# Patient Record
Sex: Male | Born: 1955 | Race: White | Hispanic: No | Marital: Single | Smoking: Former smoker
Health system: Southern US, Community
[De-identification: ages and names within clinical notes are randomized; demographics above are authoritative.]

## PROBLEM LIST (undated history)

## (undated) DIAGNOSIS — Z87442 Personal history of urinary calculi: Secondary | ICD-10-CM

## (undated) DIAGNOSIS — J449 Chronic obstructive pulmonary disease, unspecified: Secondary | ICD-10-CM

## (undated) DIAGNOSIS — Z720 Tobacco use: Secondary | ICD-10-CM

## (undated) DIAGNOSIS — E669 Obesity, unspecified: Secondary | ICD-10-CM

## (undated) DIAGNOSIS — T8859XA Other complications of anesthesia, initial encounter: Secondary | ICD-10-CM

## (undated) DIAGNOSIS — I10 Essential (primary) hypertension: Secondary | ICD-10-CM

## (undated) DIAGNOSIS — G4730 Sleep apnea, unspecified: Secondary | ICD-10-CM

## (undated) DIAGNOSIS — M1990 Unspecified osteoarthritis, unspecified site: Secondary | ICD-10-CM

## (undated) DIAGNOSIS — E119 Type 2 diabetes mellitus without complications: Secondary | ICD-10-CM

## (undated) HISTORY — PX: CHOLECYSTECTOMY: SHX55

## (undated) HISTORY — PX: APPENDECTOMY: SHX54

## (undated) HISTORY — DX: Obesity, unspecified: E66.9

## (undated) HISTORY — DX: Chronic obstructive pulmonary disease, unspecified: J44.9

## (undated) HISTORY — PX: CARDIAC CATHETERIZATION: SHX172

## (undated) HISTORY — DX: Tobacco use: Z72.0

## (undated) HISTORY — PX: KNEE SURGERY: SHX244

## (undated) HISTORY — DX: Unspecified osteoarthritis, unspecified site: M19.90

## (undated) HISTORY — PX: CARPAL TUNNEL RELEASE: SHX101

## (undated) HISTORY — DX: Sleep apnea, unspecified: G47.30

## (undated) HISTORY — DX: Essential (primary) hypertension: I10

---

## 1998-01-14 ENCOUNTER — Ambulatory Visit (HOSPITAL_BASED_OUTPATIENT_CLINIC_OR_DEPARTMENT_OTHER): Admission: RE | Admit: 1998-01-14 | Discharge: 1998-01-14 | Payer: Self-pay | Admitting: Orthopedic Surgery

## 2006-10-19 ENCOUNTER — Ambulatory Visit: Payer: Self-pay | Admitting: Orthopaedic Surgery

## 2006-11-08 ENCOUNTER — Ambulatory Visit: Payer: Self-pay | Admitting: Orthopaedic Surgery

## 2006-11-08 ENCOUNTER — Other Ambulatory Visit: Payer: Self-pay

## 2006-11-27 ENCOUNTER — Emergency Department: Payer: Self-pay | Admitting: Emergency Medicine

## 2014-11-13 ENCOUNTER — Ambulatory Visit (INDEPENDENT_AMBULATORY_CARE_PROVIDER_SITE_OTHER): Payer: Worker's Compensation | Admitting: Physician Assistant

## 2014-11-13 VITALS — BP 138/82 | HR 80 | Temp 98.1°F | Resp 18 | Ht 67.5 in | Wt 317.6 lb

## 2014-11-13 DIAGNOSIS — S0181XA Laceration without foreign body of other part of head, initial encounter: Secondary | ICD-10-CM | POA: Diagnosis not present

## 2014-11-13 DIAGNOSIS — R51 Headache: Secondary | ICD-10-CM

## 2014-11-13 DIAGNOSIS — R519 Headache, unspecified: Secondary | ICD-10-CM

## 2014-11-13 MED ORDER — KETOROLAC TROMETHAMINE 60 MG/2ML IM SOLN
60.0000 mg | Freq: Once | INTRAMUSCULAR | Status: AC
  Administered 2014-11-13: 60 mg via INTRAMUSCULAR

## 2014-11-13 NOTE — Progress Notes (Signed)
   11/13/2014 at 7:02 PM  Jerome Sims / DOB: 02-Oct-1955 / MRN: [id number]   SUBJECTIVE  Chief complaint: Facial Laceration  Patient here today after sustaining a blow to the face with a "rachet" while at work.  Now has a laceration under the left eye.  Reports that work "made him come in for medical treatment."  He reports a mild HA associated with the injury.  He denies eye pain, changes in vision, nausea, photophobia and scotoma.  Denies bone pain.   Medications reviewed and updated by myself where necessary, and exist elsewhere in the encounter.   Jerome Sims has No Known Allergies. He  reports that he has been smoking.  He does not have any smokeless tobacco history on file. He  has no sexual activity history on file. The patient  has past surgical history that includes Cholecystectomy and Appendectomy.  His family history is not on file.  ROS  As per HPI  OBJECTIVE  His  height is 5' 7.5" (1.715 m) and weight is 317 lb 9.6 oz (144.062 kg). His oral temperature is 98.1 F (36.7 C). His blood pressure is 138/82 and his pulse is 80. His respiration is 18 and oxygen saturation is 97%.  The patient's body mass index is 48.98 kg/(m^2).  Physical Exam  Constitutional: He is oriented to person, place, and time. Vital signs are normal. He appears well-developed and well-nourished. No distress.  HENT:  Head:    Eyes: EOM and lids are normal. Pupils are equal, round, and reactive to light. Right eye exhibits no discharge. No foreign body present in the right eye. Left eye exhibits no discharge. No foreign body present in the left eye. Right conjunctiva is not injected. Right conjunctiva has no hemorrhage. Left conjunctiva is not injected. Left conjunctiva has no hemorrhage.  Cardiovascular: Normal rate.   Respiratory: Effort normal.  Neurological: He is alert and oriented to person, place, and time. No cranial nerve deficit.  Skin: Skin is warm and dry. He is not diaphoretic.    Procedure: Risk and benefits discussed and verbal consent obtained. The patient was anesthetized using 4 cc of 2% lidocaine with epinephrine.  The wound was scrubbed with soap and water. Sterile prep and drape. The wound was closed with 6-0 prolene.  The wound was then cleaned with water and a bandage was applied. Patient instructed to come back for suture removal in 6-7 days.    No results found for this or any previous visit (from the past 24 hour(s)).  ASSESSMENT & PLAN  Jerome Sims was seen today for facial laceration.  Diagnoses and all orders for this visit:  Facial laceration, initial encounter Orders: -     Tdap vaccine greater than or equal to 7yo IM  Acute nonintractable headache, unspecified headache type Orders: -     ketorolac (TORADOL) injection 60 mg; Inject 2 mLs (60 mg total) into the muscle once.   The patient was advised to call or come back to clinic if he does not see an improvement in symptoms, or worsens with the above plan.   Philis Fendt, MHS, PA-C [facility] 11/13/2014 7:02 PM

## 2014-11-13 NOTE — Patient Instructions (Signed)
WOUND CARE ?Please return in 6 days to have your stitches/staples removed or sooner if you have concerns. ? Keep area clean and dry for 24 hours. Do not remove bandage, if applied. ? After 24 hours, remove bandage and wash wound gently with mild soap and warm water. Reapply a new bandage after cleaning wound, if directed. ? Continue daily cleansing with soap and water until stitches/staples are removed. ? Do not apply any ointments or creams to the wound while stitches/staples are in place, as this may cause delayed healing. ? Notify the office if you experience any of the following signs of infection: Swelling, redness, pus drainage, streaking, fever >101.0 F ? Notify the office if you experience excessive bleeding that does not stop after 15-20 minutes of constant, firm pressure. ? ?

## 2014-11-20 ENCOUNTER — Ambulatory Visit (INDEPENDENT_AMBULATORY_CARE_PROVIDER_SITE_OTHER): Payer: Worker's Compensation | Admitting: Urgent Care

## 2014-11-20 VITALS — BP 142/80 | HR 68 | Temp 98.1°F | Resp 14 | Ht 67.5 in | Wt 327.0 lb

## 2014-11-20 DIAGNOSIS — Z4802 Encounter for removal of sutures: Secondary | ICD-10-CM

## 2014-11-20 DIAGNOSIS — S0181XD Laceration without foreign body of other part of head, subsequent encounter: Secondary | ICD-10-CM

## 2014-11-20 NOTE — Progress Notes (Signed)
   Patient: Jerome Sims [id number]  Subjective: Jerome Sims is returning for suture removal. Patient was initially seen 11/13/2014 and had 6 simple interrupted sutures placed near his left lower eyelid. Denies fever, drainage of pus or blood, wound dehiscence, edema, pain.   Objective: Physical Exam  HENT:  Head:      #6 sutures removed without incident. Patient tolerated this well.  Assessment and Plan: Well-healed wound. Anticipatory guidance provided. Return to clinic as needed.  Jaynee Eagles, PA-C [facility] [phone] 11/20/2014  3:17 PM

## 2014-11-21 NOTE — Progress Notes (Signed)
  Medical screening examination/treatment/procedure(s) were performed by non-physician practitioner and as supervising physician I was immediately available for consultation/collaboration.     

## 2016-09-20 DIAGNOSIS — J438 Other emphysema: Secondary | ICD-10-CM | POA: Insufficient documentation

## 2016-09-20 DIAGNOSIS — Z6841 Body Mass Index (BMI) 40.0 and over, adult: Secondary | ICD-10-CM

## 2016-09-20 DIAGNOSIS — G4733 Obstructive sleep apnea (adult) (pediatric): Secondary | ICD-10-CM | POA: Insufficient documentation

## 2016-09-20 DIAGNOSIS — Z9989 Dependence on other enabling machines and devices: Secondary | ICD-10-CM

## 2016-09-21 DIAGNOSIS — L578 Other skin changes due to chronic exposure to nonionizing radiation: Secondary | ICD-10-CM | POA: Insufficient documentation

## 2016-09-21 DIAGNOSIS — F17210 Nicotine dependence, cigarettes, uncomplicated: Secondary | ICD-10-CM | POA: Insufficient documentation

## 2016-09-23 ENCOUNTER — Ambulatory Visit (INDEPENDENT_AMBULATORY_CARE_PROVIDER_SITE_OTHER): Payer: BLUE CROSS/BLUE SHIELD | Admitting: Adult Health

## 2016-09-23 ENCOUNTER — Encounter: Payer: Self-pay | Admitting: Adult Health

## 2016-09-23 VITALS — Ht 67.5 in | Wt 321.5 lb

## 2016-09-23 DIAGNOSIS — Z7689 Persons encountering health services in other specified circumstances: Secondary | ICD-10-CM | POA: Diagnosis not present

## 2016-09-23 DIAGNOSIS — N200 Calculus of kidney: Secondary | ICD-10-CM | POA: Insufficient documentation

## 2016-09-23 DIAGNOSIS — Z1211 Encounter for screening for malignant neoplasm of colon: Secondary | ICD-10-CM

## 2016-09-23 DIAGNOSIS — Z720 Tobacco use: Secondary | ICD-10-CM | POA: Diagnosis not present

## 2016-09-23 DIAGNOSIS — J438 Other emphysema: Secondary | ICD-10-CM

## 2016-09-23 MED ORDER — VARENICLINE TARTRATE 1 MG PO TABS: 1.0000 mg | ORAL_TABLET | Freq: Two times a day (BID) | ORAL | 3 refills | Status: DC

## 2016-09-23 MED ORDER — VARENICLINE TARTRATE 0.5 MG X 11 & 1 MG X 42 PO MISC: ORAL | 0 refills | Status: DC

## 2016-09-23 MED ORDER — FLUTICASONE FUROATE-VILANTEROL 100-25 MCG/INH IN AEPB: 1.0000 | INHALATION_SPRAY | Freq: Every day | RESPIRATORY_TRACT | 11 refills | Status: DC

## 2016-09-23 NOTE — Patient Instructions (Signed)
It was great meeting you today   I have sent in a prescription for an inhaler called Breo - use this daily   Someone will call you to schedule your colonoscopy   Someone will call you to schedule your CT   Follow up with me for your physical - I am ok with doing it on a Tuesday at 6

## 2016-09-23 NOTE — Progress Notes (Signed)
Patient presents to clinic today to establish care. He is a pleasant 61 year old male who  has a past medical history of Arthritis; COPD (chronic obstructive pulmonary disease) ([place]); Hypertension; Kidney stones; Obesity; Sleep apnea; and Tobacco use.   Last physical was in 2016 - per patient.   Acute Concerns: Establish Care   Chronic Issues: Tobacco Use - smokes about one pack a day. He has just started using a patch and has noticed slight improvement in the amount of cigarettes he smokes. He reports feeling as though he is short of breath a lot of the time. He knows he needs to quit smoking and wants to quit.   Sleep Apnea - wears a CPAP most nights.   Hypertension - Has lisinopril prescribed. Does not take  Arthritis - Was recently prescribed Voltaren from [facility] and reports that this is helping with his joint pain significantly. Marland Kitchen   Health Maintenance: Dental -- Dentures  Vision -- Does not see  Immunizations -- UTD  Colonoscopy -- Before the age of 56 but unsure when    Past Medical History:  Diagnosis Date  . Arthritis   . COPD (chronic obstructive pulmonary disease) ([place])   . Hypertension   . Kidney stones   . Obesity   . Sleep apnea   . Tobacco use     Past Surgical History:  Procedure Laterality Date  . APPENDECTOMY    . CHOLECYSTECTOMY      No current outpatient prescriptions on file prior to visit.   No current facility-administered medications on file prior to visit.     No Known Allergies  Family History  Problem Relation Age of Onset  . Heart defect Maternal Grandmother   . AAA (abdominal aortic aneurysm) Mother   . AAA (abdominal aortic aneurysm) Father     Social History   Social History  . Marital status: Unknown    Spouse name: N/A  . Number of children: N/A  . Years of education: N/A   Occupational History  . Not on file.   Social History Main Topics  . Smoking status: Current Every Day Smoker    Packs/day: 1.00    Years: 30.00   . Smokeless tobacco: Never Used  . Alcohol use No  . Drug use: No  . Sexual activity: Not on file   Other Topics Concern  . 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     Review of Systems  Constitutional: Negative.   HENT: Negative.   Eyes: Negative.   Respiratory: Positive for shortness of breath and wheezing.   Cardiovascular: Negative.   Gastrointestinal: Negative.   Genitourinary: Negative.   Musculoskeletal: Positive for joint pain.  Skin: Negative.   Neurological: Negative.   Endo/Heme/Allergies: Negative.   Psychiatric/Behavioral: Negative.   All other systems reviewed and are negative.   Ht 5' 7.5" (1.715 m)   Wt (!) 321 lb 8 oz (145.8 kg)   BMI 49.61 kg/m   Physical Exam  Constitutional: He is oriented to person, place, and time and well-developed, well-nourished, and in no distress. No distress.  Obese   HENT:  Head: Normocephalic and atraumatic.  Right Ear: External ear normal.  Left Ear: External ear normal.  Nose: Nose normal.  Mouth/Throat: Oropharynx is clear and moist. No oropharyngeal exudate.  Cardiovascular: Normal rate, regular rhythm, normal heart sounds and intact distal pulses.  Exam reveals no gallop and no friction rub.   No murmur heard. Pulmonary/Chest: Effort normal. No respiratory distress. He has wheezes (throughout ). He has no rales. He exhibits no tenderness.  Abdominal: Soft. Bowel sounds are normal. He exhibits no distension and no mass. There is no tenderness. There is no rebound and no guarding.  Neurological: He is alert and oriented to person, place, and time. He has normal reflexes. He displays normal reflexes. No cranial nerve deficit. He exhibits normal muscle tone. Gait normal. Coordination normal. GCS score is 15.  Skin: Skin is warm and dry. No rash noted. He is not diaphoretic. No erythema. No pallor.    Psychiatric: Mood, memory, affect and judgment normal.  Nursing note and vitals reviewed.  Assessment/Plan: 1. Encounter to establish care - Follow up for CPE - Needs to work on weight reduction through healthy diet and exercise  - Follow up with any acute complaints.   2. Tobacco use  - CT CHEST LUNG CA SCREEN LOW DOSE W/O CM; Future - varenicline (CHANTIX CONTINUING MONTH PAK) 1 MG tablet; Take 1 tablet (1 mg total) by mouth 2 (two) times daily.  Dispense: 60 tablet; Refill: 3 - varenicline (CHANTIX STARTING MONTH PAK) 0.5 MG X 11 & 1 MG X 42 tablet; Take one 0.5 mg tablet by mouth once daily for 3 days, then increase to one 0.5 mg tablet twice daily for 4 days, then increase to one 1 mg tablet twice daily.  Dispense: 53 tablet; Refill: 0 Trial of chantix. Common side effects including rare risk of suicide ideation was discussed with the patient today.  Patient is instructed to go directly to the ED if this occurs.  We discussed that patient can continue to smoke for 1 week after starting chantix, but then must discontinue cigarettes.  5 minutes spent with patient today on tobacco cessation counseling.  - Follow up in one month   3. Other emphysema (HCC)  - fluticasone furoate-vilanterol (BREO ELLIPTA) 100-25 MCG/INH AEPB; Inhale 1 puff into the lungs daily.  Dispense: 28 each; Refill: 11  4. Colon cancer screening  - Ambulatory referral to Gastroenterology  Dorothyann Peng, NP

## 2016-09-30 ENCOUNTER — Encounter: Payer: Self-pay | Admitting: Gastroenterology

## 2016-10-18 ENCOUNTER — Other Ambulatory Visit: Payer: Self-pay | Admitting: Adult Health

## 2016-10-18 DIAGNOSIS — Z87891 Personal history of nicotine dependence: Secondary | ICD-10-CM

## 2016-10-26 ENCOUNTER — Ambulatory Visit (INDEPENDENT_AMBULATORY_CARE_PROVIDER_SITE_OTHER): Payer: BLUE CROSS/BLUE SHIELD | Admitting: Adult Health

## 2016-10-26 ENCOUNTER — Encounter: Payer: Self-pay | Admitting: Adult Health

## 2016-10-26 ENCOUNTER — Ambulatory Visit (AMBULATORY_SURGERY_CENTER): Payer: Self-pay

## 2016-10-26 VITALS — Ht 69.0 in | Wt 324.2 lb

## 2016-10-26 VITALS — BP 128/82 | HR 75 | Temp 98.4°F | Ht 67.0 in | Wt 323.7 lb

## 2016-10-26 DIAGNOSIS — F17210 Nicotine dependence, cigarettes, uncomplicated: Secondary | ICD-10-CM

## 2016-10-26 DIAGNOSIS — Z6841 Body Mass Index (BMI) 40.0 and over, adult: Secondary | ICD-10-CM | POA: Diagnosis not present

## 2016-10-26 DIAGNOSIS — Z0000 Encounter for general adult medical examination without abnormal findings: Secondary | ICD-10-CM | POA: Diagnosis not present

## 2016-10-26 DIAGNOSIS — L409 Psoriasis, unspecified: Secondary | ICD-10-CM

## 2016-10-26 DIAGNOSIS — Z125 Encounter for screening for malignant neoplasm of prostate: Secondary | ICD-10-CM | POA: Diagnosis not present

## 2016-10-26 DIAGNOSIS — Z1211 Encounter for screening for malignant neoplasm of colon: Secondary | ICD-10-CM

## 2016-10-26 MED ORDER — PHENTERMINE HCL 15 MG PO CAPS: 15.0000 mg | ORAL_CAPSULE | ORAL | 0 refills | Status: DC

## 2016-10-26 MED ORDER — NA SULFATE-K SULFATE-MG SULF 17.5-3.13-1.6 GM/177ML PO SOLN: 1.0000 | Freq: Once | ORAL | 0 refills | Status: AC

## 2016-10-26 MED ORDER — TRIAMCINOLONE ACETONIDE 0.5 % EX OINT: 1.0000 "application " | TOPICAL_OINTMENT | Freq: Two times a day (BID) | CUTANEOUS | 6 refills | Status: DC

## 2016-10-26 NOTE — Patient Instructions (Addendum)
I will follow up with you regarding your blood work   Work on [employer] through diet and exercise. I have prescription for a weight loss medication called Phentermine. Please take this daily in the morning. Follow up with me in one month

## 2016-10-26 NOTE — Progress Notes (Signed)
Denies allergies to eggs or soy products. Denies complication of anesthesia or sedation. Denies use of weight loss medication. Denies use of O2.   Emmi instructions given for colonoscopy.  
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
## 2016-10-27 LAB — HEPATIC FUNCTION PANEL
ALT: 17 U/L (ref 0–53)
AST: 20 U/L (ref 0–37)
Albumin: 3.8 g/dL (ref 3.5–5.2)
Alkaline Phosphatase: 82 U/L (ref 39–117)
Bilirubin, Direct: 0.1 mg/dL (ref 0.0–0.3)
TOTAL PROTEIN: 6.4 g/dL (ref 6.0–8.3)
Total Bilirubin: 0.4 mg/dL (ref 0.2–1.2)

## 2016-10-27 LAB — BASIC METABOLIC PANEL
BUN: 8 mg/dL (ref 6–23)
CHLORIDE: 105 meq/L (ref 96–112)
CO2: 26 meq/L (ref 19–32)
Calcium: 8.6 mg/dL (ref 8.4–10.5)
Creatinine, Ser: 0.94 mg/dL (ref 0.40–1.50)
GFR: 87.32 mL/min (ref >60.00)
Glucose, Bld: 92 mg/dL (ref 70–99)
POTASSIUM: 4 meq/L (ref 3.5–5.1)
Sodium: 139 mEq/L (ref 135–145)

## 2016-10-27 LAB — TSH: TSH: 2.99 u[IU]/mL (ref 0.35–4.50)

## 2016-10-27 LAB — CBC WITH DIFFERENTIAL/PLATELET
BASOS PCT: 1.3 % (ref 0.0–3.0)
Basophils Absolute: 0.1 10*3/uL (ref 0.0–0.1)
EOS PCT: 3.8 % (ref 0.0–5.0)
Eosinophils Absolute: 0.3 10*3/uL (ref 0.0–0.7)
HEMATOCRIT: 40.6 % (ref 39.0–52.0)
HEMOGLOBIN: 14 g/dL (ref 13.0–17.0)
LYMPHS PCT: 37.2 % (ref 12.0–46.0)
Lymphs Abs: 2.5 10*3/uL (ref 0.7–4.0)
MCHC: 34.4 g/dL (ref 30.0–36.0)
MCV: 89.2 fl (ref 78.0–100.0)
MONO ABS: 0.5 10*3/uL (ref 0.1–1.0)
Monocytes Relative: 6.8 % (ref 3.0–12.0)
Neutro Abs: 3.5 10*3/uL (ref 1.4–7.7)
Neutrophils Relative %: 50.9 % (ref 43.0–77.0)
Platelets: 217 10*3/uL (ref 150.0–400.0)
RBC: 4.55 Mil/uL (ref 4.22–5.81)
RDW: 13.9 % (ref 11.5–15.5)
WBC: 6.8 10*3/uL (ref 4.0–10.5)

## 2016-10-27 LAB — LIPID PANEL
Cholesterol: 172 mg/dL (ref 0–200)
HDL: 35.4 mg/dL — ABNORMAL LOW (ref >39.00)
LDL Cholesterol: 115 mg/dL — ABNORMAL HIGH (ref 0–99)
NonHDL: 136.33
Total CHOL/HDL Ratio: 5
Triglycerides: 109 mg/dL (ref 0.0–149.0)
VLDL: 21.8 mg/dL (ref 0.0–40.0)

## 2016-10-27 LAB — PSA: PSA: 0.22 ng/mL (ref 0.10–4.00)

## 2016-10-27 LAB — HEMOGLOBIN A1C: HEMOGLOBIN A1C: 6.3 % (ref 4.6–6.5)

## 2016-10-28 ENCOUNTER — Encounter: Payer: Self-pay | Admitting: Gastroenterology

## 2016-11-03 ENCOUNTER — Telehealth: Payer: Self-pay

## 2016-11-03 NOTE — Telephone Encounter (Signed)
Left message for patient to call back, he will need to be rescheduled at the hospital.

## 2016-11-03 NOTE — Telephone Encounter (Signed)
-----   Message from [place], MD sent at 11/03/2016 12:20 PM EDT ----- Regarding: FW: BMI > 50 I do not know how this patient got through [facility] scheduling, but he needs to be rescheduled to hospital outpatient schedule for screening colonoscopy.  - HD ----- Message ----- From: Osvaldo Angst, CRNA Sent: 11/03/2016   9:17 AM To: Anastasia Pall, Nelida Meuse III, MD Subject: BMI > 50                                       Doc,  This pt is scheduled for a procedure but has a BMI >50 so needs to be rescheduled at the hospital.  Thanks,  Osvaldo Angst

## 2016-11-04 ENCOUNTER — Telehealth: Payer: Self-pay

## 2016-11-04 NOTE — Telephone Encounter (Signed)
-----   Message from [place], MD sent at 11/03/2016 12:20 PM EDT ----- Regarding: FW: BMI > 50 I do not know how this patient got through [facility] scheduling, but he needs to be rescheduled to hospital outpatient schedule for screening colonoscopy.  - HD ----- Message ----- From: Osvaldo Angst, CRNA Sent: 11/03/2016   9:17 AM To: Anastasia Pall, Nelida Meuse III, MD Subject: BMI > 50                                       Doc,  This pt is scheduled for a procedure but has a BMI >50 so needs to be rescheduled at the hospital.  Thanks,  Osvaldo Angst

## 2016-11-04 NOTE — Telephone Encounter (Signed)
Left a message for patient on 7/25 and 7/26 to call office to reschedule colonoscopy. Dr. Loletha Carrow would like to reschedule this to his hospital day in September or after.

## 2016-11-05 ENCOUNTER — Other Ambulatory Visit: Payer: Self-pay

## 2016-11-05 DIAGNOSIS — Z1211 Encounter for screening for malignant neoplasm of colon: Secondary | ICD-10-CM

## 2016-11-08 ENCOUNTER — Other Ambulatory Visit: Payer: Self-pay

## 2016-11-10 ENCOUNTER — Encounter: Payer: BLUE CROSS/BLUE SHIELD | Admitting: Gastroenterology

## 2016-11-30 ENCOUNTER — Ambulatory Visit (INDEPENDENT_AMBULATORY_CARE_PROVIDER_SITE_OTHER): Payer: BLUE CROSS/BLUE SHIELD | Admitting: Adult Health

## 2016-11-30 VITALS — BP 134/74 | Temp 97.5°F | Wt 317.0 lb

## 2016-11-30 DIAGNOSIS — F17210 Nicotine dependence, cigarettes, uncomplicated: Secondary | ICD-10-CM

## 2016-11-30 DIAGNOSIS — Z6841 Body Mass Index (BMI) 40.0 and over, adult: Secondary | ICD-10-CM

## 2016-11-30 MED ORDER — DICLOFENAC SODIUM 75 MG PO TBEC: 75.0000 mg | DELAYED_RELEASE_TABLET | Freq: Two times a day (BID) | ORAL | 11 refills | Status: DC

## 2016-11-30 MED ORDER — PHENTERMINE HCL 15 MG PO CAPS: 15.0000 mg | ORAL_CAPSULE | ORAL | 0 refills | Status: DC

## 2016-11-30 NOTE — Progress Notes (Signed)
Subjective:    Patient ID: Jerome Sims, male    DOB: 01-19-1956, 61 y.o.   MRN: [id number]  HPI  61 year old male who  has a past medical history of Arthritis; COPD (chronic obstructive pulmonary disease) ([place]); Hypertension; Kidney stones; Obesity; Sleep apnea; and Tobacco use.  He presents to the clinic today for one month follow up regarding obesity. He has suffered from obesity for most of his life. He is working on living a healthier life and wanted to try a weight loss supplement to aid him in losing weight. During the last visit he was started on Phentermine 15 mg. His weight at this visit was 323 lbs.   Today in the office he reports that he has stopped drinking soda and is drinking more water. He feels as though he has a lot more energy and that his " color has returned".   He continues to be smoke free and is weaning himself off the patches.   Overall he feels good and is wondering if it would be ok for him to start working out  [employer] Readings from Last 3 Encounters:  11/30/16 (!) 317 lb (143.8 kg)  10/26/16 (!) 323 lb 11.2 oz (146.8 kg)  10/26/16 (!) 324 lb 3.2 oz (147.1 kg)   Review of Systems See HPI   Past Medical History:  Diagnosis Date  . Arthritis   . COPD (chronic obstructive pulmonary disease) ([place])   . Hypertension   . Kidney stones   . Obesity   . Sleep apnea   . Tobacco use     Social History   Social History  . Marital status: Single    Spouse name: N/A  . Number of children: N/A  . Years of education: N/A   Occupational History  . Not on file.   Social History Main Topics  . Smoking status: Former Smoker    Packs/day: 1.00    Years: 30.00  . Smokeless tobacco: Never Used     Comment: Quite a week ago.   . Alcohol use No  . Drug use: No  . Sexual activity: Not on file   Other Topics Concern  . 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     Past Surgical History:  Procedure Laterality Date  . APPENDECTOMY    . CHOLECYSTECTOMY      Family History  Problem Relation Age of Onset  . Heart defect Maternal Grandmother   . AAA (abdominal aortic aneurysm) Mother   . AAA (abdominal aortic aneurysm) Father   . Colon cancer Neg Hx   . Esophageal cancer Neg Hx   . Pancreatic cancer Neg Hx   . Rectal cancer Neg Hx   . Stomach cancer Neg Hx     No Known Allergies  Current Outpatient Prescriptions on File Prior to Visit  Medication Sig Dispense Refill  . fluticasone furoate-vilanterol (BREO ELLIPTA) 100-25 MCG/INH AEPB Inhale 1 puff into the lungs daily. 28 each 11  . Nicotine 21-14-7 MG/24HR KIT Place 1 patch onto the skin daily.    Marland Kitchen triamcinolone ointment (KENALOG) 0.5 % Apply 1 application topically 2 (two) times daily. 30 g 6   No current facility-administered medications on file prior to visit.     BP 134/74 (BP Location: Left Arm)  Temp (!) 97.5 F (36.4 C) (Oral)   Wt (!) 317 lb (143.8 kg)   BMI 49.65 kg/m       Objective:   Physical Exam  Constitutional: He is oriented to person, place, and time. He appears well-developed and well-nourished. No distress.  Cardiovascular: Normal rate, regular rhythm, normal heart sounds and intact distal pulses.  Exam reveals no gallop and no friction rub.   No murmur heard. Pulmonary/Chest: Effort normal and breath sounds normal. No respiratory distress. He has no wheezes. He has no rales. He exhibits no tenderness.  Neurological: He is alert and oriented to person, place, and time.  Skin: Skin is warm and dry. No rash noted. He is not diaphoretic. No erythema. No pallor.  Psychiatric: He has a normal mood and affect. His behavior is normal. Judgment and thought content normal.  Nursing note and vitals reviewed.     Assessment & Plan:  1. Morbid obesity with BMI of 45.0-49.9, adult ([place]) - He is doing well with weight loss.  - I am ok with him  starting to exercise. Start slow and build up.  - phentermine 15 MG capsule; Take 1 capsule (15 mg total) by mouth every morning.  Dispense: 30 capsule; Refill: 0 - Follow up in one month   2. Heavy smoker (more than 20 cigarettes per day) - Continue with the nicotine patch.   Dorothyann Peng, NP

## 2016-12-15 ENCOUNTER — Encounter: Payer: Self-pay | Admitting: Family Medicine

## 2016-12-15 ENCOUNTER — Ambulatory Visit (INDEPENDENT_AMBULATORY_CARE_PROVIDER_SITE_OTHER): Payer: BLUE CROSS/BLUE SHIELD | Admitting: Family Medicine

## 2016-12-15 VITALS — BP 142/78 | HR 72 | Temp 97.7°F | Wt 321.0 lb

## 2016-12-15 DIAGNOSIS — G5602 Carpal tunnel syndrome, left upper limb: Secondary | ICD-10-CM | POA: Diagnosis not present

## 2016-12-15 MED ORDER — PREDNISONE 20 MG PO TABS: ORAL_TABLET | ORAL | 1 refills | Status: DC

## 2016-12-15 NOTE — Progress Notes (Signed)
Jerome Sims is a 61 year old male nonsmoker..... Truck driver by trade...Marland KitchenMarland KitchenMarland Kitchen who comes in today for evaluation of carpal tunnel syndrome  15 years ago he had surgery on his right wrist by Dr. Wess Botts for carpal tunnel syndrome. He was supposed to go back and have surgery on his left wrist over the left wrist resolve spontaneously. Over the past 10 years ago and goes now ......... it's been present now for about a week. He describes a tingling sensation no change in strength.  Review of systems otherwise negative  BP (!) 142/78 (BP Location: Left Arm, Patient Position: Sitting, Cuff Size: Large)   Pulse 72   Temp 97.7 F (36.5 C) (Oral)   Wt (!) 321 lb (145.6 kg)   SpO2 97%   BMI 50.28 kg/m  Examination right hand shows a scar from previous carpal tunnel surgery. Left hand appears normal. Tinel sign negative strength normal  Impression #1 carpal tunnel syndrome left wrist......Marland Kitchen with mild intermittent symptoms 10 years........... outlined a program of splinting for 4-6 weeks. If this does not resolve the problem number consider referral to Dr. Gardenia Phlegm for injection

## 2016-12-15 NOTE — Patient Instructions (Signed)
Wear the splints 24/7 for 6 weeks. If at that juncture if symptoms not resolved then call and we'll get you an appointment to see Dr. Gardenia Phlegm for further evaluation and treatment.  Stop the Voltaren  Prednisone burst and taper as outlined

## 2016-12-30 ENCOUNTER — Encounter (HOSPITAL_COMMUNITY): Payer: Self-pay | Admitting: *Deleted

## 2017-01-03 ENCOUNTER — Other Ambulatory Visit: Payer: Self-pay

## 2017-01-03 ENCOUNTER — Telehealth: Payer: Self-pay | Admitting: Gastroenterology

## 2017-01-03 MED ORDER — NA SULFATE-K SULFATE-MG SULF 17.5-3.13-1.6 GM/177ML PO SOLN: 1.0000 | Freq: Once | ORAL | 0 refills | Status: AC

## 2017-01-03 NOTE — Telephone Encounter (Signed)
Spoke to patient, he has not received prep in the mail. He has not picked up prep, pharmacy restocked it. Sent prep instructions and Rx to [facility] in [place]. Called and spoke to Joe, pharmacist and confirmed that they will attach prep instructions to the [place]. Patient confirmed that he has not been eating any corn, nuts, seeds etc for last 5 days. He has been on clear liquids since last evening.

## 2017-01-04 ENCOUNTER — Ambulatory Visit (HOSPITAL_COMMUNITY): Payer: BLUE CROSS/BLUE SHIELD | Admitting: Anesthesiology

## 2017-01-04 ENCOUNTER — Encounter (HOSPITAL_COMMUNITY): Payer: Self-pay | Admitting: *Deleted

## 2017-01-04 ENCOUNTER — Ambulatory Visit (HOSPITAL_COMMUNITY)
Admission: RE | Admit: 2017-01-04 | Discharge: 2017-01-04 | Disposition: A | Discharge status: Home / Self Care | Payer: BLUE CROSS/BLUE SHIELD | Source: Ambulatory Visit | Attending: Gastroenterology | Admitting: Gastroenterology

## 2017-01-04 ENCOUNTER — Encounter (HOSPITAL_COMMUNITY)
Admission: RE | Disposition: A | Discharge status: Home / Self Care | Payer: Self-pay | Source: Ambulatory Visit | Attending: Gastroenterology

## 2017-01-04 DIAGNOSIS — Z1211 Encounter for screening for malignant neoplasm of colon: Secondary | ICD-10-CM

## 2017-01-04 DIAGNOSIS — J449 Chronic obstructive pulmonary disease, unspecified: Secondary | ICD-10-CM | POA: Insufficient documentation

## 2017-01-04 DIAGNOSIS — Z87442 Personal history of urinary calculi: Secondary | ICD-10-CM | POA: Insufficient documentation

## 2017-01-04 DIAGNOSIS — Z6841 Body Mass Index (BMI) 40.0 and over, adult: Secondary | ICD-10-CM | POA: Insufficient documentation

## 2017-01-04 DIAGNOSIS — G4730 Sleep apnea, unspecified: Secondary | ICD-10-CM | POA: Insufficient documentation

## 2017-01-04 DIAGNOSIS — R7303 Prediabetes: Secondary | ICD-10-CM | POA: Insufficient documentation

## 2017-01-04 DIAGNOSIS — D122 Benign neoplasm of ascending colon: Secondary | ICD-10-CM

## 2017-01-04 DIAGNOSIS — I10 Essential (primary) hypertension: Secondary | ICD-10-CM | POA: Insufficient documentation

## 2017-01-04 HISTORY — DX: Personal history of urinary calculi: Z87.442

## 2017-01-04 HISTORY — PX: COLONOSCOPY: SHX5424

## 2017-01-04 SURGERY — COLONOSCOPY
Anesthesia: Monitor Anesthesia Care

## 2017-01-04 MED ORDER — LACTATED RINGERS IV SOLN
INTRAVENOUS | Status: DC | PRN
  Administered 2017-01-04: 09:00:00 via INTRAVENOUS

## 2017-01-04 MED ORDER — PROPOFOL 10 MG/ML IV BOLUS
INTRAVENOUS | Status: AC
  Filled 2017-01-04: qty 20

## 2017-01-04 MED ORDER — PROPOFOL 500 MG/50ML IV EMUL
INTRAVENOUS | Status: DC | PRN
  Administered 2017-01-04: 150 ug/kg/min via INTRAVENOUS

## 2017-01-04 MED ORDER — PROPOFOL 10 MG/ML IV BOLUS
INTRAVENOUS | Status: DC | PRN
  Administered 2017-01-04: 20 mg via INTRAVENOUS

## 2017-01-04 MED ORDER — SODIUM CHLORIDE 0.9 % IV SOLN: INTRAVENOUS | Status: DC

## 2017-01-04 MED ORDER — PROPOFOL 10 MG/ML IV BOLUS
INTRAVENOUS | Status: AC
  Filled 2017-01-04: qty 60

## 2017-01-04 MED ORDER — LIDOCAINE 2% (20 MG/ML) 5 ML SYRINGE
INTRAMUSCULAR | Status: AC
  Filled 2017-01-04: qty 5

## 2017-01-04 MED ORDER — LIDOCAINE 2% (20 MG/ML) 5 ML SYRINGE
INTRAMUSCULAR | Status: DC | PRN
  Administered 2017-01-04: 40 mg via INTRAVENOUS

## 2017-01-04 NOTE — Transfer of Care (Signed)
Immediate Anesthesia Transfer of Care Note  Patient: Jerome Sims  Procedure(s) Performed: Procedure(s): COLONOSCOPY (N/A)  Patient Location: PACU and Endoscopy Unit  Anesthesia Type:MAC  Level of Consciousness: drowsy and patient cooperative  Airway & Oxygen Therapy: Patient Spontanous Breathing and Patient connected to nasal cannula oxygen  Post-op Assessment: Report given to RN and Post -op Vital signs reviewed and stable  Post vital signs: Reviewed and stable  Last Vitals:  Vitals:   01/04/17 0820  BP: (!) 144/84  Pulse: 73  Resp: 10  Temp: 36.6 C  SpO2: 97%    Last Pain:  Vitals:   01/04/17 0820  TempSrc: Oral         Complications: No apparent anesthesia complications

## 2017-01-04 NOTE — Interval H&P Note (Signed)
History and Physical Interval Note:  01/04/2017 9:17 AM  Jerome Sims  has presented today for surgery, with the diagnosis of screening colonoscopy  The various methods of treatment have been discussed with the patient and family. After consideration of risks, benefits and other options for treatment, the patient has consented to  Procedure(s): COLONOSCOPY (N/A) as a surgical intervention .  The patient's history has been reviewed, patient examined, no change in status, stable for surgery.  I have reviewed the patient's chart and labs.  Questions were answered to the patient's satisfaction.     Nelida Meuse III

## 2017-01-04 NOTE — Anesthesia Postprocedure Evaluation (Signed)
Anesthesia Post Note  Patient: ZAIVION KUNDRAT  Procedure(s) Performed: Procedure(s) (LRB): COLONOSCOPY (N/A)     Patient location during evaluation: PACU Anesthesia Type: MAC Level of consciousness: awake and alert Pain management: pain level controlled Vital Signs Assessment: post-procedure vital signs reviewed and stable Respiratory status: spontaneous breathing, nonlabored ventilation, respiratory function stable and patient connected to nasal cannula oxygen Cardiovascular status: stable and blood pressure returned to baseline Postop Assessment: no apparent nausea or vomiting Anesthetic complications: no    Last Vitals:  Vitals:   01/04/17 1006 01/04/17 1010  BP: (!) 99/47 106/69  Pulse: 74 80  Resp: (!) 21 17  Temp:    SpO2: 100% 97%    Last Pain:  Vitals:   01/04/17 0820  TempSrc: Oral                 Nance Mccombs S

## 2017-01-04 NOTE — H&P (Signed)
History:  This patient presents for endoscopic testing for colon cancer screening.  Yaakov Guthrie Referring physician: Dorothyann Peng, NP  Past Medical History: Past Medical History:  Diagnosis Date  . Arthritis   . COPD (chronic obstructive pulmonary disease) ([place])   . History of kidney stones   . Hypertension    HX OF 1 YEAR AGO NEVER TOOK MEDS FOR  . Obesity   . Pre-diabetes   . Sleep apnea   . Tobacco use      Past Surgical History: Past Surgical History:  Procedure Laterality Date  . APPENDECTOMY    . CHOLECYSTECTOMY      Allergies: No Known Allergies  Outpatient Meds: Current Facility-Administered Medications  Medication Dose Route Frequency Provider Last Rate Last Dose  . 0.9 %  sodium chloride infusion   Intravenous Continuous Danis, Kirke Corin, MD       Facility-Administered Medications Ordered in Other Encounters  Medication Dose Route Frequency Provider Last Rate Last Dose  . lactated ringers infusion    Continuous PRN Dione Booze, CRNA          ___________________________________________________________________ Objective   Exam:  BP (!) 144/84   Pulse 73   Temp 97.9 F (36.6 C) (Oral)   Resp 10   Ht 5\' 8"  (1.727 m)   Wt (!) 317 lb (143.8 kg)   SpO2 97%   BMI 48.20 kg/m    CV: RRR without murmur, S1/S2, no JVD, no peripheral edema  Resp: clear to auscultation bilaterally, normal RR and effort noted  GI: soft, morbidly obese, no tenderness, with active bowel sounds.  Neuro: awake, alert and oriented x 3. Normal gross motor function and fluent speech   Assessment:  Average risk for colon cancer  Plan:  Colonoscopy.   Nelida Meuse III

## 2017-01-04 NOTE — Anesthesia Procedure Notes (Signed)
Procedure Name: MAC Date/Time: 01/04/2017 9:30 AM Performed by: Dione Booze Pre-anesthesia Checklist: Patient identified, Emergency Drugs available, Suction available and Patient being monitored Patient Re-evaluated:Patient Re-evaluated prior to induction Oxygen Delivery Method: Nasal cannula Induction Type: IV induction Placement Confirmation: positive ETCO2

## 2017-01-04 NOTE — Op Note (Signed)
[facility] Patient Name: Jerome Sims Procedure Date: 01/04/2017 MRN: [id number] Attending MD: Estill Cotta. Loletha Carrow , MD Date of Birth: 23-Apr-1955 CSN: [id number] Age: 61 Admit Type: Outpatient Procedure:                Colonoscopy Indications:              Screening for colorectal malignant neoplasm, This                            is the patient's first screening colonoscopy Providers:                Mallie Mussel L. Loletha Carrow, MD, Burtis Junes, RN, Cherylynn Ridges,                            Technician, Dione Booze, CRNA Referring MD:              Medicines:                Monitored Anesthesia Care Complications:            No immediate complications. Estimated Blood Loss:     Estimated blood loss: none. Procedure:                Pre-Anesthesia Assessment:                           - Prior to the procedure, a History and Physical                            was performed, and patient medications and                            allergies were reviewed. The patient's tolerance of                            previous anesthesia was also reviewed. The risks                            and benefits of the procedure and the sedation                            options and risks were discussed with the patient.                            All questions were answered, and informed consent                            was obtained. Prior Anticoagulants: The patient has                            taken no previous anticoagulant or antiplatelet                            agents. ASA Grade Assessment: III - A patient with  severe systemic disease. After reviewing the risks                            and benefits, the patient was deemed in                            satisfactory condition to undergo the procedure.                           After obtaining informed consent, the colonoscope                            was passed under direct vision. Throughout the                             procedure, the patient's blood pressure, pulse, and                            oxygen saturations were monitored continuously. The                            EC-[id number] ([id number]) scope was introduced through                            the anus and advanced to the the cecum, identified                            by appendiceal orifice and ileocecal valve. The                            colonoscopy was performed without difficulty. The                            patient tolerated the procedure well. The quality                            of the bowel preparation was excellent. The                            ileocecal valve, appendiceal orifice, and rectum                            were photographed. The quality of the bowel                            preparation was evaluated using the BBPS [facility]                            Bowel Preparation Scale) with scores of: Right                            Colon = 2, Transverse Colon = 3 and Left Colon = 3.  The total BBPS score equals 8. The bowel                            preparation used was SUPREP. Scope In: 9:37:49 AM Scope Out: 9:54:20 AM Scope Withdrawal Time: 0 hours 10 minutes 42 seconds  Total Procedure Duration: 0 hours 16 minutes 31 seconds  Findings:      The perianal and digital rectal examinations were normal.      A 8 mm polyp was found in the proximal ascending colon. The polyp was       sessile. The polyp was removed with a cold snare. Resection and       retrieval were complete.      The exam was otherwise without abnormality on direct and retroflexion       views. Impression:               - One 8 mm polyp in the proximal ascending colon,                            removed with a cold snare. Resected and retrieved.                           - The examination was otherwise normal on direct                            and retroflexion views. Moderate Sedation:      MAC sedation  used Recommendation:           - Patient has a contact number available for                            emergencies. The signs and symptoms of potential                            delayed complications were discussed with the                            patient. Return to normal activities tomorrow.                            Written discharge instructions were provided to the                            patient.                           - Resume previous diet.                           - Continue present medications.                           - Await pathology results.                           - Repeat colonoscopy is recommended for  surveillance. The colonoscopy date will be                            determined after pathology results from today's                            exam become available for review. Procedure Code(s):        --- Professional ---                           [phone], Colonoscopy, flexible; with removal of                            tumor(s), polyp(s), or other lesion(s) by snare                            technique Diagnosis Code(s):        --- Professional ---                           Z12.11, Encounter for screening for malignant                            neoplasm of colon                           D12.2, Benign neoplasm of ascending colon CPT copyright 2016 American Medical Association. All rights reserved. The codes documented in this report are preliminary and upon coder review may  be revised to meet current compliance requirements. Javed Cotto L. Loletha Carrow, MD 01/04/2017 9:57:14 AM This report has been signed electronically. Number of Addenda: 0

## 2017-01-04 NOTE — Discharge Instructions (Signed)

## 2017-01-04 NOTE — Anesthesia Preprocedure Evaluation (Addendum)
Anesthesia Evaluation  Patient identified by MRN, date of birth, ID band Patient awake    Reviewed: Allergy & Precautions, NPO status , Patient's Chart, lab work & pertinent test results  Airway Mallampati: III  TM Distance: <3 FB Neck ROM: Full    Dental no notable dental hx.    Pulmonary sleep apnea , COPD, former smoker,    breath sounds clear to auscultation + decreased breath sounds      Cardiovascular hypertension, Normal cardiovascular exam Rhythm:Regular Rate:Normal  Untreated HTN   Neuro/Psych negative neurological ROS  negative psych ROS   GI/Hepatic negative GI ROS, Neg liver ROS,   Endo/Other  Morbid obesity  Renal/GU negative Renal ROS  negative genitourinary   Musculoskeletal negative musculoskeletal ROS (+)   Abdominal (+) + obese,   Peds negative pediatric ROS (+)  Hematology negative hematology ROS (+)   Anesthesia Other Findings   Reproductive/Obstetrics negative OB ROS                             Anesthesia Physical Anesthesia Plan  ASA: III  Anesthesia Plan: MAC   Post-op Pain Management:    Induction: Intravenous  PONV Risk Score and Plan: 0  Airway Management Planned: Simple Face Mask  Additional Equipment:   Intra-op Plan:   Post-operative Plan:   Informed Consent: I have reviewed the patients History and Physical, chart, labs and discussed the procedure including the risks, benefits and alternatives for the proposed anesthesia with the patient or authorized representative who has indicated his/her understanding and acceptance.   Dental advisory given  Plan Discussed with: CRNA and Surgeon  Anesthesia Plan Comments:        Anesthesia Quick Evaluation

## 2017-01-05 ENCOUNTER — Encounter (HOSPITAL_COMMUNITY): Payer: Self-pay | Admitting: Gastroenterology

## 2017-01-07 ENCOUNTER — Encounter: Payer: Self-pay | Admitting: Adult Health

## 2017-01-07 ENCOUNTER — Ambulatory Visit (INDEPENDENT_AMBULATORY_CARE_PROVIDER_SITE_OTHER): Payer: BLUE CROSS/BLUE SHIELD | Admitting: Adult Health

## 2017-01-07 VITALS — BP 150/90 | Temp 98.3°F | Wt 330.0 lb

## 2017-01-07 DIAGNOSIS — M5412 Radiculopathy, cervical region: Secondary | ICD-10-CM | POA: Diagnosis not present

## 2017-01-07 DIAGNOSIS — Z6841 Body Mass Index (BMI) 40.0 and over, adult: Secondary | ICD-10-CM

## 2017-01-07 MED ORDER — CYCLOBENZAPRINE HCL 10 MG PO TABS: 10.0000 mg | ORAL_TABLET | Freq: Three times a day (TID) | ORAL | 0 refills | Status: DC | PRN

## 2017-01-07 MED ORDER — PHENTERMINE HCL 15 MG PO CAPS: 15.0000 mg | ORAL_CAPSULE | ORAL | 0 refills | Status: DC

## 2017-01-07 NOTE — Patient Instructions (Signed)
It was great seeing you today   I think you have a pinched nerve in your upper back, possibly from your injury at work   I am going to prescribe you a muscle relaxer and get some x rays.   If this does not improve with this then I am going to get further imaging of your shoulder and send you to physical therapy

## 2017-01-07 NOTE — Progress Notes (Signed)
Subjective:    Patient ID: Jerome Sims, male    DOB: 29-Mar-1956, 61 y.o.   MRN: [id number]  HPI  61 year old male who  has a past medical history of Arthritis; COPD (chronic obstructive pulmonary disease) ([place]); History of kidney stones; Hypertension; Obesity; Pre-diabetes; Sleep apnea; and Tobacco use.  He presents to the office today with the  complaint of pain in his left elbow and numbness and tingling in his left fingers. This has been becoming progressively worse since around August 9th. He reports that he was injured at work and he grabbed onto something to prevent him from falling.  He was seen by Dr. Sherren Mocha, who thought that his discomfort was possibly from carpal tunnel syndrome and was prescribed prednisone. He took this medication and had some improvement in pain, but had significant weight gain and insomnia.  Wt Readings from Last 3 Encounters:  01/07/17 (!) 330 lb (149.7 kg)  01/04/17 (!) 317 lb (143.8 kg)  12/15/16 (!) 321 lb (145.6 kg)   Today he reports that he continues to have pain in his upper back an left shoulder that radaites down his left arm into his elbow and forearm. He also complains of numbness/tingling in the finger tips of his left hand. Pain is worse with certain movements. He feels as though his grip strength has decreased in his left arm.   He also needs a refill of Phentermine.   Review of Systems See HPI   Past Medical History:  Diagnosis Date  . Arthritis   . COPD (chronic obstructive pulmonary disease) ([place])   . History of kidney stones   . Hypertension    HX OF 1 YEAR AGO NEVER TOOK MEDS FOR  . Obesity   . Pre-diabetes   . Sleep apnea   . Tobacco use     Social History   Social History  . Marital status: Single    Spouse name: N/A  . Number of children: N/A  . Years of education: N/A   Occupational History  . Not on file.   Social History Main Topics  . Smoking status: Former Smoker    Packs/day: 1.00    Years: 30.00  .  Smokeless tobacco: Never Used     Comment: QUIT October 18 2016  . Alcohol use No  . Drug use: No  . Sexual activity: Not on file   Other Topics Concern  . 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     Past Surgical History:  Procedure Laterality Date  . APPENDECTOMY    . CHOLECYSTECTOMY    . COLONOSCOPY N/A 01/04/2017   Procedure: COLONOSCOPY;  Surgeon: Doran Stabler, MD;  Location: [facility];  Service: Gastroenterology;  Laterality: N/A;    Family History  Problem Relation Age of Onset  . Heart defect Maternal Grandmother   . AAA (abdominal aortic aneurysm) Mother   . AAA (abdominal aortic aneurysm) Father   . Colon cancer Neg Hx   . Esophageal cancer Neg Hx   . Pancreatic cancer Neg Hx   . Rectal cancer Neg Hx   . Stomach cancer Neg Hx     No Known Allergies  Current Outpatient Prescriptions on File Prior to Visit  Medication Sig Dispense Refill  . diclofenac (VOLTAREN) 75 MG EC  tablet Take 1 tablet (75 mg total) by mouth 2 (two) times daily. (Patient taking differently: Take 75 mg by mouth daily. ) 60 tablet 11  . fluticasone furoate-vilanterol (BREO ELLIPTA) 100-25 MCG/INH AEPB Inhale 1 puff into the lungs daily. 28 each 11  . triamcinolone ointment (KENALOG) 0.5 % Apply 1 application topically 2 (two) times daily. (Patient taking differently: Apply 1 application topically 2 (two) times daily as needed (for skin irritation/psoriasis). ) 30 g 6   No current facility-administered medications on file prior to visit.     BP (!) 150/90 (BP Location: Right Arm)   Temp 98.3 F (36.8 C) (Oral)   Wt (!) 330 lb (149.7 kg)   BMI 50.18 kg/m       Objective:   Physical Exam  Constitutional: He is oriented to person, place, and time. He appears well-developed and well-nourished. No distress.  Cardiovascular: Normal rate, regular rhythm, normal heart  sounds and intact distal pulses.  Exam reveals no gallop and no friction rub.   No murmur heard. Pulmonary/Chest: Effort normal and breath sounds normal. No respiratory distress. He has no wheezes. He has no rales. He exhibits no tenderness.  Musculoskeletal: He exhibits tenderness. He exhibits no edema or deformity.  Pain with palpation to cervical spine, left shoulder, scapula, and elbow.  No bruising or trauma noted.  He is able to raise his left arm above his head.    Neurological: He is alert and oriented to person, place, and time. He displays no tremor. A sensory deficit (decreased sensation in all finger tips in left hand ) is present. He exhibits normal muscle tone.  5/5 grip strength in right hand  4/5 grip strength in left hand  Obvious pain with resistance exercises    Skin: Skin is warm and dry. No rash noted. He is not diaphoretic. No erythema. No pallor.  Psychiatric: He has a normal mood and affect. His behavior is normal. Judgment and thought content normal.  Vitals reviewed.     Assessment & Plan:  1. Cervical radiculopathy - exam consistent with cervical radiculopathy. He has good ROM with left shoulder so Rotator cuff injury is less of a concern. I am going to have him stop prednisone. Will prescribe muscle relaxer. He was advised that this may make him sleepy. Will also get x ray of cervical spine and left shoulder. Consider further imaging and/or referral to PT if no improvement  - cyclobenzaprine (FLEXERIL) 10 MG tablet; Take 1 tablet (10 mg total) by mouth 3 (three) times daily as needed for muscle spasms.  Dispense: 30 tablet; Refill: 0 - DG Cervical Spine Complete; Future - DG Shoulder Left; Future - Follow up if no improvement in 2-3 days   2. Morbid obesity with BMI of 45.0-49.9, adult (HCC) - phentermine 15 MG capsule; Take 1 capsule (15 mg total) by mouth every morning.  Dispense: 30 capsule; Refill: 0 - Follow up in one month   Dorothyann Peng, NP

## 2017-01-10 ENCOUNTER — Other Ambulatory Visit: Payer: BLUE CROSS/BLUE SHIELD

## 2017-01-10 ENCOUNTER — Encounter: Payer: Self-pay | Admitting: Gastroenterology

## 2017-01-10 ENCOUNTER — Ambulatory Visit (INDEPENDENT_AMBULATORY_CARE_PROVIDER_SITE_OTHER)
Admission: RE | Admit: 2017-01-10 | Discharge: 2017-01-10 | Disposition: A | Discharge status: Home / Self Care | Payer: BLUE CROSS/BLUE SHIELD | Source: Ambulatory Visit | Attending: Adult Health | Admitting: Adult Health

## 2017-01-10 DIAGNOSIS — M5412 Radiculopathy, cervical region: Secondary | ICD-10-CM

## 2017-01-11 ENCOUNTER — Telehealth: Payer: Self-pay | Admitting: Adult Health

## 2017-01-11 NOTE — Telephone Encounter (Signed)
Informed patient of his x rays. He has mild degenerative changes without any acute abnormalities.   He has not started the muscle relaxers yet. He is going to try them tonight and follow up on Friday

## 2017-01-14 ENCOUNTER — Other Ambulatory Visit: Payer: Self-pay | Admitting: Adult Health

## 2017-01-14 ENCOUNTER — Ambulatory Visit (INDEPENDENT_AMBULATORY_CARE_PROVIDER_SITE_OTHER): Payer: BLUE CROSS/BLUE SHIELD | Admitting: Adult Health

## 2017-01-14 ENCOUNTER — Encounter: Payer: Self-pay | Admitting: Adult Health

## 2017-01-14 VITALS — BP 142/84 | Temp 98.1°F | Wt 329.0 lb

## 2017-01-14 DIAGNOSIS — M25512 Pain in left shoulder: Secondary | ICD-10-CM

## 2017-01-14 NOTE — Progress Notes (Signed)
Subjective:    Patient ID: Jerome Sims, male    DOB: [birth year], 61 y.o.   MRN: [id number]  HPI  61 year old male who  has a past medical history of Arthritis; COPD (chronic obstructive pulmonary disease) ([place]); History of kidney stones; Hypertension; Obesity; Pre-diabetes; Sleep apnea; and Tobacco use.   He presents to the office today for follow up regarding cervical spine pain and left shoulder pain with numbness and tingling in his left fingers. This issue has been becoming progressively worse since early August. He was prescribed a course of prednisone and has some relief but had significant weight gain. During his last visit he was prescribed a muscle relaxer as it was thought that his pain was related to a pinched nerve or muscle spasm.   X ray of left shoulder showed:  IMPRESSION: Degenerative change without acute abnormality.  He reports that all the muscle relaxer made him do was sleep. He has not noticed any relief of pain. Pain is actually worse today.    Review of Systems See HPI   Past Medical History:  Diagnosis Date  . Arthritis   . COPD (chronic obstructive pulmonary disease) ([place])   . History of kidney stones   . Hypertension    HX OF 1 YEAR AGO NEVER TOOK MEDS FOR  . Obesity   . Pre-diabetes   . Sleep apnea   . Tobacco use     Social History   Social History  . Marital status: Single    Spouse name: N/A  . Number of children: N/A  . Years of education: N/A   Occupational History  . Not on file.   Social History Main Topics  . Smoking status: Former Smoker    Packs/day: 1.00    Years: 30.00  . Smokeless tobacco: Never Used     Comment: QUIT October 18 2016  . Alcohol use No  . Drug use: No  . Sexual activity: Not on file   Other Topics Concern  . 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     Past Surgical History:    Procedure Laterality Date  . APPENDECTOMY    . CHOLECYSTECTOMY    . COLONOSCOPY N/A 01/04/2017   Procedure: COLONOSCOPY;  Surgeon: Doran Stabler, MD;  Location: [facility];  Service: Gastroenterology;  Laterality: N/A;    Family History  Problem Relation Age of Onset  . Heart defect Maternal Grandmother   . AAA (abdominal aortic aneurysm) Mother   . AAA (abdominal aortic aneurysm) Father   . Colon cancer Neg Hx   . Esophageal cancer Neg Hx   . Pancreatic cancer Neg Hx   . Rectal cancer Neg Hx   . Stomach cancer Neg Hx     No Known Allergies  Current Outpatient Prescriptions on File Prior to Visit  Medication Sig Dispense Refill  . cyclobenzaprine (FLEXERIL) 10 MG tablet Take 1 tablet (10 mg total) by mouth 3 (three) times daily as needed for muscle spasms. 30 tablet 0  . diclofenac (VOLTAREN) 75 MG EC tablet Take 1 tablet (75 mg total) by mouth 2 (two) times daily. (Patient taking differently: Take 75 mg by mouth daily. ) 60 tablet 11  . fluticasone furoate-vilanterol (BREO ELLIPTA) 100-25 MCG/INH AEPB Inhale 1 puff into the lungs  daily. 28 each 11  . phentermine 15 MG capsule Take 1 capsule (15 mg total) by mouth every morning. 30 capsule 0  . triamcinolone ointment (KENALOG) 0.5 % Apply 1 application topically 2 (two) times daily. (Patient taking differently: Apply 1 application topically 2 (two) times daily as needed (for skin irritation/psoriasis). ) 30 g 6   No current facility-administered medications on file prior to visit.     BP (!) 142/84 (BP Location: Left Arm)   Temp 98.1 F (36.7 C) (Oral)   Wt (!) 329 lb (149.2 kg)   BMI 50.02 kg/m       Objective:   Physical Exam  Constitutional: He is oriented to person, place, and time. He appears well-developed and well-nourished. No distress.  Cardiovascular: Normal rate, regular rhythm, normal heart sounds and intact distal pulses.  Exam reveals no gallop and no friction rub.   No murmur  heard. Pulmonary/Chest: Effort normal and breath sounds normal. No respiratory distress. He has no wheezes. He has no rales. He exhibits no tenderness.  Musculoskeletal:  Pain with palpation to left shoulder and scapula. He is unable to raise arm above head without appearing in significant pain. He has to stop and midway point.  Has normal grip strength    Neurological: He is alert and oriented to person, place, and time.  Skin: Skin is warm and dry. He is not diaphoretic.  Psychiatric: He has a normal mood and affect. His behavior is normal. Thought content normal.  Nursing note and vitals reviewed.     Assessment & Plan:  1. Acute pain of left shoulder - There is some concern for possible rotator cuff injury. Pain around scapula seems to be more msk in nature.  - Due to nature of injury, time frame,and any resolution at all, I am going to send to orthopedics for further evaluation  - AMB referral to orthopedics   Dorothyann Peng, NP

## 2017-01-26 ENCOUNTER — Encounter (INDEPENDENT_AMBULATORY_CARE_PROVIDER_SITE_OTHER): Payer: Self-pay | Admitting: Orthopedic Surgery

## 2017-01-26 ENCOUNTER — Ambulatory Visit (INDEPENDENT_AMBULATORY_CARE_PROVIDER_SITE_OTHER): Payer: Self-pay | Admitting: Orthopedic Surgery

## 2017-01-26 DIAGNOSIS — G8929 Other chronic pain: Secondary | ICD-10-CM

## 2017-01-26 DIAGNOSIS — M25512 Pain in left shoulder: Secondary | ICD-10-CM

## 2017-01-26 NOTE — Progress Notes (Signed)
Office Visit Note   Patient: Jerome Sims           Date of Birth: 1955-06-19           MRN: [id number] Visit Date: 01/26/2017 Requested by: Dorothyann Peng, NP [address] [place], [place] [address] PCP: Dorothyann Peng, NP  Subjective: Chief Complaint  Patient presents with  . Left Shoulder - Injury    HPI: Jerome Sims is a 61 year old patient with left shoulder and neck pain.  Patient sustained injury 11/28/2016 when he fell from the back of his truck.  This gave himan abduction injury to his left shoulder.  Patient is right-hand dominant.  Describes some numbness and tingling in his fingers dorsal aspect.  He has tried prednisone and Flexeril without relief.  He has been by his report out of work since injury.  He has a history of carpal tunnel release on the right.              ROS:All systems reviewed are negative as they relate to the chief complaint within the history of present illness.  Patient denies  fevers or chills.     Assessment & Plan: Visit Diagnoses:  1. Chronic left shoulder pain     Plan: impression is left shoulder pain with possible radicular component.  Been 2 months since injury.  Shoulder examination today is pretty reasonable but he may have a labral tear based on his mechanism of injury.  Plan MRI cervical spine to evaluate left radiculopathy and MRI arthrogram left shoulder to evaluate for occult rotator cuff tear versus labral pathology.  Follow-Up Instructions: Return for after MRI.   Orders:  No orders of the defined types were placed in this encounter.  No orders of the defined types were placed in this encounter.     Procedures: No procedures performed   Clinical Data: No additional findings.  Objective: Vital Signs: There were no vitals taken for this visit.  Physical Exam:   Constitutional: Patient appears well-developed HEENT:  Head: Normocephalic Eyes:EOM are normal Neck: Normal range of motion Cardiovascular: Normal  rate Pulmonary/chest: Effort normal Neurologic: Patient is alert Skin: Skin is warm Psychiatric: Patient has normal mood and affect    Ortho Exam: orthopedic exam demonstrates pretty reasonable cervical spine range of motion 5 out of 5 grip EPL FPL interosseous wrist flexion and wrist extension biceps triceps and deltoid strength.  Negative apprehension relocation testing on the left.  Rotator cuff strength intact left and right to infraspinatus supraspinatus and subscap muscle testing.  No other masses lymph adenopathy or skin changes noted in the shoulder girdle region.  Triceps biceps reflexes symmetric  Specialty Comments:  No specialty comments available.  Imaging: No results found.   PMFS History: Patient Active Problem List   Diagnosis Date Noted  . Carpal tunnel syndrome of left wrist 12/15/2016  . Kidney stones 09/23/2016  . Heavy smoker (more than 20 cigarettes per day) 09/21/2016  . Sun-damaged skin 09/21/2016  . Morbid obesity with BMI of 45.0-49.9, adult ([place]) 09/20/2016  . OSA on CPAP 09/20/2016  . Other emphysema ([place]) 09/20/2016   Past Medical History:  Diagnosis Date  . Arthritis   . COPD (chronic obstructive pulmonary disease) ([place])   . History of kidney stones   . Hypertension    HX OF 1 YEAR AGO NEVER TOOK MEDS FOR  . Obesity   . Pre-diabetes   . Sleep apnea   . Tobacco use     Family History  Problem Relation Age of Onset  . Heart defect Maternal Grandmother   . AAA (abdominal aortic aneurysm) Mother   . AAA (abdominal aortic aneurysm) Father   . Colon cancer Neg Hx   . Esophageal cancer Neg Hx   . Pancreatic cancer Neg Hx   . Rectal cancer Neg Hx   . Stomach cancer Neg Hx     Past Surgical History:  Procedure Laterality Date  . APPENDECTOMY    . CHOLECYSTECTOMY    . COLONOSCOPY N/A 01/04/2017   Procedure: COLONOSCOPY;  Surgeon: Doran Stabler, MD;  Location: [facility];  Service: Gastroenterology;  Laterality: N/A;   Social History    Occupational History  . Not on file.   Social History Main Topics  . Smoking status: Former Smoker    Packs/day: 1.00    Years: 30.00  . Smokeless tobacco: Never Used     Comment: QUIT October 18 2016  . Alcohol use No  . Drug use: No  . Sexual activity: Not on file

## 2017-02-01 ENCOUNTER — Other Ambulatory Visit: Payer: Self-pay | Admitting: Family Medicine

## 2017-02-01 NOTE — Telephone Encounter (Signed)
Cory's patient.

## 2017-02-19 ENCOUNTER — Other Ambulatory Visit: Payer: Self-pay | Admitting: Adult Health

## 2017-02-19 DIAGNOSIS — Z6841 Body Mass Index (BMI) 40.0 and over, adult: Principal | ICD-10-CM

## 2017-02-22 NOTE — Telephone Encounter (Signed)
Needs an office visit.

## 2017-02-22 NOTE — Telephone Encounter (Signed)
Denied.  Pt needs med check for refills of this medication.

## 2017-03-09 ENCOUNTER — Ambulatory Visit
Admission: RE | Admit: 2017-03-09 | Discharge: 2017-03-09 | Disposition: A | Discharge status: Home / Self Care | Payer: Self-pay | Source: Ambulatory Visit | Attending: Orthopedic Surgery | Admitting: Orthopedic Surgery

## 2017-03-09 DIAGNOSIS — M25512 Pain in left shoulder: Secondary | ICD-10-CM

## 2017-03-09 MED ORDER — IOPAMIDOL (ISOVUE-M 200) INJECTION 41%
15.0000 mL | Freq: Once | INTRAMUSCULAR | Status: AC
  Administered 2017-03-09: 15 mL via INTRA_ARTICULAR

## 2017-03-14 ENCOUNTER — Ambulatory Visit (INDEPENDENT_AMBULATORY_CARE_PROVIDER_SITE_OTHER): Payer: Self-pay | Admitting: Orthopedic Surgery

## 2017-03-14 ENCOUNTER — Encounter (INDEPENDENT_AMBULATORY_CARE_PROVIDER_SITE_OTHER): Payer: Self-pay | Admitting: Orthopedic Surgery

## 2017-03-14 DIAGNOSIS — M5412 Radiculopathy, cervical region: Secondary | ICD-10-CM

## 2017-03-18 NOTE — Progress Notes (Signed)
Office Visit Note   Patient: Jerome Sims           Date of Birth: March 16, 1956           MRN: [id number] Visit Date: 03/14/2017 Requested by: Dorothyann Peng, NP [address] [place], [place] [address] PCP: Dorothyann Peng, NP  Subjective: Chief Complaint  Patient presents with  . Left Shoulder - Follow-up    HPI: Braylee is a patient with shoulder and radicular pain.  Since I have seen him he has had an MRI scan which is reviewed with him.  Patient has at C4-5 and C5-6 severe bilateral foraminal narrowing.  At C6-7 he has an H&P which is worse on the left.  He reports shoulder pain.  He describes subjective weakness as well.              ROS: All systems reviewed are negative as they relate to the chief complaint within the history of present illness.  Patient denies  fevers or chills.   Assessment & Plan: Visit Diagnoses:  1. Cervical radiculopathy     Plan: Impression is cervical radiculopathy with bilateral foraminal narrowing.  Disc herniation at C6-7 is worse on the left.  Patient is most interested in resolution of symptoms and is more interested in surgical evaluation as opposed to neck injections.  I will have him follow-up with 1 of our neck surgeons for further management.  Follow-Up Instructions: No Follow-up on file.   Orders:  No orders of the defined types were placed in this encounter.  No orders of the defined types were placed in this encounter.     Procedures: No procedures performed   Clinical Data: No additional findings.  Objective: Vital Signs: There were no vitals taken for this visit.  Physical Exam:   Constitutional: Patient appears well-developed HEENT:  Head: Normocephalic Eyes:EOM are normal Neck: Normal range of motion Cardiovascular: Normal rate Pulmonary/chest: Effort normal Neurologic: Patient is alert Skin: Skin is warm Psychiatric: Patient has normal mood and affect    Ortho Exam: Orthopedic exam demonstrates  symmetric reflexes and good grip strength bilaterally.  Radial pulses intact.  Neck range of motion predictably diminished by about 20% no muscle atrophy in either of the arms.  Does have some paresthesias on the left and the cease 5 6 distribution  Specialty Comments:  No specialty comments available.  Imaging: No results found.   PMFS History: Patient Active Problem List   Diagnosis Date Noted  . Carpal tunnel syndrome of left wrist 12/15/2016  . Kidney stones 09/23/2016  . Heavy smoker (more than 20 cigarettes per day) 09/21/2016  . Sun-damaged skin 09/21/2016  . Morbid obesity with BMI of 45.0-49.9, adult ([place]) 09/20/2016  . OSA on CPAP 09/20/2016  . Other emphysema ([place]) 09/20/2016   Past Medical History:  Diagnosis Date  . Arthritis   . COPD (chronic obstructive pulmonary disease) ([place])   . History of kidney stones   . Hypertension    HX OF 1 YEAR AGO NEVER TOOK MEDS FOR  . Obesity   . Pre-diabetes   . Sleep apnea   . Tobacco use     Family History  Problem Relation Age of Onset  . Heart defect Maternal Grandmother   . AAA (abdominal aortic aneurysm) Mother   . AAA (abdominal aortic aneurysm) Father   . Colon cancer Neg Hx   . Esophageal cancer Neg Hx   . Pancreatic cancer Neg Hx   . Rectal cancer Neg Hx   .  Stomach cancer Neg Hx     Past Surgical History:  Procedure Laterality Date  . APPENDECTOMY    . CHOLECYSTECTOMY    . COLONOSCOPY N/A 01/04/2017   Procedure: COLONOSCOPY;  Surgeon: Doran Stabler, MD;  Location: [facility];  Service: Gastroenterology;  Laterality: N/A;   Social History   Occupational History  . Not on file  Tobacco Use  . Smoking status: Former Smoker    Packs/day: 1.00    Years: 30.00    Pack years: 30.00  . Smokeless tobacco: Never Used  . Tobacco comment: QUIT October 18 2016  Substance and Sexual Activity  . Alcohol use: No    Alcohol/week: 0.0 oz  . Drug use: No  . Sexual activity: Not on file

## 2017-03-24 ENCOUNTER — Ambulatory Visit (INDEPENDENT_AMBULATORY_CARE_PROVIDER_SITE_OTHER): Payer: Self-pay

## 2017-03-24 ENCOUNTER — Ambulatory Visit (INDEPENDENT_AMBULATORY_CARE_PROVIDER_SITE_OTHER): Payer: Self-pay | Admitting: Orthopaedic Surgery

## 2017-03-24 ENCOUNTER — Encounter (INDEPENDENT_AMBULATORY_CARE_PROVIDER_SITE_OTHER): Payer: Self-pay | Admitting: Orthopaedic Surgery

## 2017-03-24 VITALS — BP 153/100 | HR 84 | Ht 68.0 in | Wt 317.0 lb

## 2017-03-24 DIAGNOSIS — M5020 Other cervical disc displacement, unspecified cervical region: Secondary | ICD-10-CM

## 2017-03-24 DIAGNOSIS — M25552 Pain in left hip: Secondary | ICD-10-CM

## 2017-03-24 DIAGNOSIS — M1612 Unilateral primary osteoarthritis, left hip: Secondary | ICD-10-CM

## 2017-03-24 DIAGNOSIS — Z6841 Body Mass Index (BMI) 40.0 and over, adult: Secondary | ICD-10-CM

## 2017-03-24 NOTE — Progress Notes (Addendum)
Office Visit Note   Patient: Jerome Sims           Date of Birth: May 31, 1955           MRN: [id number] Visit Date: 03/24/2017              Requested by: Jerome Peng, NP [address] [place], [place] [address] PCP: Jerome Peng, NP   Assessment & Plan: Visit Diagnoses:  1. Left hip pain   2. Protrusion of cervical intervertebral disc   3. Morbid obesity with BMI of 45.0-49.9, adult ([place])   4. Unilateral primary osteoarthritis, left hip   5.     History of left CTS   Plan: Patient has a C6-7 broad-based paracentral disc protrusion which deforms left side of the cord with moderately severe to severe foraminal narrowing on the left side.  Discussed surgical treatment option is a C6-7 anterior cervical discectomy and fusion with allograft and plate.  He would be in a collar for 6 weeks after surgery.  She has an attorney concerning his workers comp claim which was denied.  Disc degeneration and foraminal narrowing at other levels but not severe like he has at C6-7.  Reviewed the cervical MRI scan with patient today reviewed radiologist report.  He has had persistent numbness pain since his reported accident.  Note given to patient today at his request that he was seen today and we discussed C6-7 cervical fusion surgery.  Follow-Up Instructions: follow up after NCV /EMG eval for cerv rad from C6-7 HNP  vs left CTS  Orders:  Orders Placed This Encounter  Procedures  . XR HIP UNILAT W OR W/O PELVIS 1V LEFT   No orders of the defined types were placed in this encounter.     Procedures: No procedures performed   Clinical Data: No additional findings.   Subjective: Chief Complaint  Patient presents with  . Neck - Pain    HPI 61 year old male with complex injury history to me by Dr. Alphonzo Sims for consideration of cervical surgery 7 on the left.  Patient relates that injury had on 11/28/2016 fell from the back of his truck.  The back of the truck where a 3 wheeled  device on the back of the truck was located fell landing on his side left shoulder and numbness in his left fingers.  He had previous carpal tunnel release on the right but had not have any carpal tunnel symptoms on his left.  Pain laterally in his arm which has persisted and MRI of his shoulder the left showed intact rotator cuff labrum.  He had some mild acromioclavicular and glenohumeral degenerative changes.  Complained of weakness in his arm left hand along the radial dorsal side of his hand persistently.  Patient states terminated from his job.  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  He describes an episode when he was sitting working at his computer had complete numbness perineal area which lasted for a few minutes and then resolved and he has not had any recurrent symptoms since that time.  He states he has had some problems with stairs first.  X-rays 2014 showed some hip osteoarthritis of the left hip.  Repeat films today show some progression of the left hip arthritis and also some right hip osteoarthritis negative for acute fracture.  Patient additionally has problems with sleep apnea trouble using the CPAP machine and has been problems sleeping since when he rolls on his  left side he has increased pain in his arm and hand been using a CPAP machine.  Obesity quit smoking 4 months ago smoking since age 51.  Fever or chills.  Office note from Dr. Stevie Sims on 12/15/2016 reviewed where patient was having some left numbness.  Patient has been on Voltaren in the past as well as Flexeril without improvement.  Review of Systems positive for C6-7 left paracentral disc protrusion, heavy smoker who states he is now quit, morbid obesity greater than 45.  Eczema, kidney stones, previous carpal tunnel release on the right.  Bilateral hip osteoarthritis worse on the left than right.   Objective: Vital Signs: BP (!) 153/100   Pulse 84   Ht 5\' 8"  (1.727 m)   Wt (!) 317 lb (143.8 kg)    BMI 48.20 kg/m   Physical Exam  Constitutional: He is oriented to person, place, and time. He appears well-developed and well-nourished.  HENT:  Head: Normocephalic and atraumatic.  Eyes: EOM are normal. Pupils are equal, round, and reactive to light.  Neck: No tracheal deviation present. No thyromegaly present.  Cardiovascular: Normal rate.  Pulmonary/Chest: Effort normal. He has no wheezes.  Abdominal: Soft. Bowel sounds are normal.  Neurological: He is alert and oriented to person, place, and time.  Skin: Skin is warm and dry. Capillary refill takes less than 2 seconds.  Psychiatric: He has a normal mood and affect. His behavior is normal. Judgment and thought content normal.  Positive for obesity.  Liver and spleen are not palpable.  Ortho Exam patient has brachial plexus tenderness on the left negative on the right.  He has about 80% rotation lateral tilting.  No atrophy the upper extremities.  Biceps triceps wrist flexion extension is intact.  Decreased sensation left C6 distribution versus right.  C4, C5 C7 and T1 are normal.  Well-healed carpal tunnel scar on the right.  No thenar atrophy on the left.  Negative Phalen's on the left.  Upper extremity reflexes are 1+ and symmetrical.  No lower extremity clonus knee and ankle jerk are intact anterior tib EHL is intact.  He has pain with internal rotation of his left hip at 20 degrees.  Pain with figure-of-four left hip and extremes of hip range of motion.  Straight leg raising 90 degrees.  Specialty Comments:  X-rays of the hip obtained today demonstrates moderate hip osteoarthritis both hips negative for acute changes.  Imaging: CLINICAL DATA:  Neck, left shoulder and low back pain for 3 months. No known injury.  EXAM: MRI CERVICAL SPINE WITHOUT CONTRAST  TECHNIQUE: Multiplanar, multisequence MR imaging of the cervical spine was performed. No intravenous contrast was administered.  COMPARISON:  Plain films cervical spine  01/10/2017.  FINDINGS: Alignment: Trace retrolisthesis C3 on C4 is noted.  Vertebrae: Height and signal are maintained.  Cord: Normal signal throughout.  Posterior Fossa, vertebral arteries, paraspinal tissues: Negative.  Disc levels:  C2-3: Minimal disc bulge and mild uncovertebral spurring on the left. The central canal and right foramen are widely patent. Mild left foraminal narrowing noted.  C3-4: There is a small central protrusion effacing the ventral thecal sac. Uncovertebral disease causes severe left foraminal narrowing. The right foramen is open.  C4-5: Uncovertebral disease causes moderately severe to severe bilateral foraminal narrowing. There is a shallow disc bulge but the central canal is widely patent.  C5-6: Disc bulge and right worse than left uncovertebral disease. The ventral thecal sac is narrowed but not effaced. Moderate to moderately severe foraminal narrowing  is worse on the right.  C5-6: Broad-based left paracentral protrusion and uncovertebral disease are seen. Deformity of the ventral aspect of the cord on the left is identified. Moderately severe to severe foraminal narrowing is worse on the left.  C7-T1: There is some facet arthropathy. This level is otherwise negative.  IMPRESSION: Uncovertebral disease on the left at C3-4 causes severe foraminal narrowing. Shallow central protrusion at this level effaces the ventral thecal sac but the central canal is open.  Moderately severe to severe bilateral foraminal narrowing at C4-5 due to uncovertebral spurring. The central canal is widely patent.  Moderate to moderately severe bilateral foraminal narrowing at C5-6 appears worse on the right. Shallow disc bulge at this level effaces the ventral thecal sac.  Broad-based left sided disc protrusion at C6-7 deforms the left side of the cord. Moderately severe to severe foraminal narrowing at this level is worse on the  left.   Electronically Signed   By: Inge Rise M.D.   On: 03/09/2017 15:42   PMFS History: Patient Active Problem List   Diagnosis Date Noted  . Protrusion of cervical intervertebral disc 03/24/2017  . Left hip pain 03/24/2017  . Carpal tunnel syndrome of left wrist 12/15/2016  . Kidney stones 09/23/2016  . Heavy smoker (more than 20 cigarettes per day) 09/21/2016  . Sun-damaged skin 09/21/2016  . Morbid obesity with BMI of 45.0-49.9, adult ([place]) 09/20/2016  . OSA on CPAP 09/20/2016  . Other emphysema ([place]) 09/20/2016   Past Medical History:  Diagnosis Date  . Arthritis   . COPD (chronic obstructive pulmonary disease) ([place])   . History of kidney stones   . Hypertension    HX OF 1 YEAR AGO NEVER TOOK MEDS FOR  . Obesity   . Pre-diabetes   . Sleep apnea   . Tobacco use     Family History  Problem Relation Age of Onset  . Heart defect Maternal Grandmother   . AAA (abdominal aortic aneurysm) Mother   . AAA (abdominal aortic aneurysm) Father   . Colon cancer Neg Hx   . Esophageal cancer Neg Hx   . Pancreatic cancer Neg Hx   . Rectal cancer Neg Hx   . Stomach cancer Neg Hx     Past Surgical History:  Procedure Laterality Date  . APPENDECTOMY    . CHOLECYSTECTOMY    . COLONOSCOPY N/A 01/04/2017   Procedure: COLONOSCOPY;  Surgeon: Doran Stabler, MD;  Location: [facility];  Service: Gastroenterology;  Laterality: N/A;   Social History   Occupational History  . Not on file  Tobacco Use  . Smoking status: Former Smoker    Packs/day: 1.00    Years: 30.00    Pack years: 30.00  . Smokeless tobacco: Never Used  . Tobacco comment: QUIT October 18 2016  Substance and Sexual Activity  . Alcohol use: No    Alcohol/week: 0.0 oz  . Drug use: No  . Sexual activity: Not on file

## 2017-03-25 ENCOUNTER — Telehealth (INDEPENDENT_AMBULATORY_CARE_PROVIDER_SITE_OTHER): Payer: Self-pay | Admitting: Orthopaedic Surgery

## 2017-03-25 DIAGNOSIS — M5412 Radiculopathy, cervical region: Secondary | ICD-10-CM

## 2017-03-25 NOTE — Telephone Encounter (Signed)
I sent information to Tammy to send office note to lawyer. I spoke with patient and advised Dr. Lorin Mercy is going to order EMG/NCS of left upper extremity. This will be sent to [facility] for approval and then Dr. Romona Curls area will call to schedule. Patient will need to follow up to review study with Dr. Lorin Mercy. Patient would like scheduled as soon as possible due to the fact he does not take pain meds and he is having a considerable amount of pain. I informed him I would pass that along, but we will be seeking approval from [facility]. He understands.

## 2017-03-25 NOTE — Telephone Encounter (Signed)
Patient requested office note from yesterday be faxed to his attorney at [facility]..  Fax number below. Can you please send? Thanks.

## 2017-03-25 NOTE — Telephone Encounter (Signed)
Pt called and needs a note pertaining  to his visit yesterday.Pt would like information faxed to his lawyer. Pt also wanted to know if he will be sched for another MRI.  Pt will call back in a sec to provide fax number

## 2017-03-25 NOTE — Telephone Encounter (Signed)
Patient called back with fax number for his attorney. The fax# is [phone]  Attn: Verdene Lennert

## 2017-03-25 NOTE — Telephone Encounter (Signed)
error 

## 2017-03-29 NOTE — Telephone Encounter (Signed)
RECORDS WERE FAXED 03/25/2017. REQUEST AND AUTH SCANNED IN MEDIA

## 2017-03-30 ENCOUNTER — Telehealth (INDEPENDENT_AMBULATORY_CARE_PROVIDER_SITE_OTHER): Payer: Self-pay | Admitting: Orthopaedic Surgery

## 2017-03-30 NOTE — Telephone Encounter (Signed)
Please call pt he needs to be sched for an Electrical hand test. Pt needed to know when he will be sched for surgery having trouble sleeping.  [phone]

## 2017-04-01 ENCOUNTER — Telehealth (INDEPENDENT_AMBULATORY_CARE_PROVIDER_SITE_OTHER): Payer: Self-pay | Admitting: Orthopaedic Surgery

## 2017-04-01 NOTE — Telephone Encounter (Signed)
Would like to know when his neck surgery will be scheduled with Dr. Lorin Mercy? CB # [phone]

## 2017-04-06 NOTE — Telephone Encounter (Signed)
Patient calling because he'd like to have the NCV with Dr. Ernestina Patches ASAP so that he can have the surgery.  He states he's in a lot of pain and unable to sleep.  He states he is up 4-5 times per night.  He even tried sleeping in a recliner.  He says the [facility] has denied claim relating to neck, but he has an attorney [employer]) that will go to mediation Feb 1st and would be middle of March when he receives funds from the settlement.  Are we able to schedule the NCV prior to any form of payment from the patient?   He would like to proceed with surgery and pay for services (including the surgery) after the settlement.  Please advise.

## 2017-04-07 NOTE — Telephone Encounter (Signed)
Pt scheduled for 04/22/17

## 2017-04-22 ENCOUNTER — Ambulatory Visit (INDEPENDENT_AMBULATORY_CARE_PROVIDER_SITE_OTHER): Payer: Self-pay | Admitting: Physical Medicine and Rehabilitation

## 2017-04-22 ENCOUNTER — Encounter (INDEPENDENT_AMBULATORY_CARE_PROVIDER_SITE_OTHER): Payer: Self-pay | Admitting: Physical Medicine and Rehabilitation

## 2017-04-22 VITALS — BP 133/89 | HR 74 | Temp 97.5°F

## 2017-04-22 DIAGNOSIS — R202 Paresthesia of skin: Secondary | ICD-10-CM

## 2017-04-22 NOTE — Progress Notes (Deleted)
Pt states pain in left shoulder, left elbow, and left hand. Pt states numbness and tingling in all fingers except thumb occasionally. Pt states symptoms has been going on since August 2018. Pt states pain is worse when he tries to lift his left shoulder above head, nothing makes it better. Pt is right hand dominant. No creams or lotions.

## 2017-04-25 NOTE — Procedures (Signed)
EMG & NCV Findings: Evaluation of the left median motor nerve showed prolonged distal onset latency (5.0 ms), reduced amplitude (4.9 mV), and decreased conduction velocity (Elbow-Wrist, 43 m/s).  The left median (across palm) sensory nerve showed prolonged distal peak latency (Wrist, 5.1 ms) and prolonged distal peak latency (Palm, 3.1 ms).  All remaining nerves (as indicated in the following tables) were within normal limits.    All examined muscles (as indicated in the following table) showed no evidence of electrical instability.    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   There is no significant electrodiagnostic evidence of any other focal nerve entrapment, brachial plexopathy or cervical radiculopathy.   ** As you know, this particular electrodiagnostic study cannot rule out chemical radiculitis or sensory only radiculopathy.  Recommendations: 1.  Follow-up with referring physician. 2.  Continue current management of symptoms. 3.  Suggest surgical evaluation.     Nerve Conduction Studies Anti Sensory Summary Table   Stim Site NR Peak (ms) Norm Peak (ms) P-T Amp (V) Norm P-T Amp Site1 Site2 Delta-P (ms) Dist (cm) Vel (m/s) Norm Vel (m/s)  Left Median Acr Palm Anti Sensory (2nd Digit)  32.3C  Wrist    *5.1 <3.6 15.6 >10 Wrist Palm 2.0 0.0    Palm    *3.1 <2.0 3.7         Left Radial Anti Sensory (Base 1st Digit)  32.7C  Wrist    2.1 <3.1 16.7  Wrist Base 1st Digit 2.1 0.0    Left Ulnar Anti Sensory (5th Digit)  32.7C  Wrist    3.7 <3.7 17.1 >15.0 Wrist 5th Digit 3.7 14.0 38 >38   Motor Summary Table   Stim Site NR Onset (ms) Norm Onset (ms) O-P Amp (mV) Norm O-P Amp Site1 Site2 Delta-0 (ms) Dist (cm) Vel (m/s) Norm Vel (m/s)  Left Median Motor (Abd Poll Brev)  33.1C  Wrist    *5.0 <4.2 *4.9 >5 Elbow  Wrist 5.1 22.0 *43 >50  Elbow    10.1  3.0         Left Ulnar Motor (Abd Dig Min)  33.1C  Wrist    3.4 <4.2 10.9 >3 B Elbow Wrist 3.8 20.0 53 >53  B Elbow    7.2  8.4  A Elbow B Elbow 1.7 10.0 59 >53  A Elbow    8.9  6.9          EMG   Side Muscle Nerve Root Ins Act Fibs Psw Amp Dur Poly Recrt Int Fraser Din Comment  Left 1stDorInt Ulnar C8-T1 Nml Nml Nml Nml Nml 0 Nml Nml   Left Abd Poll Brev Median C8-T1 Nml Nml Nml Nml Nml 0 Nml Nml   Left ExtDigCom   Nml Nml Nml Nml Nml 0 Nml Nml   Left Triceps Radial C6-7-8 Nml Nml Nml Nml Nml 0 Nml Nml   Left Deltoid Axillary C5-6 Nml Nml Nml Nml Nml 0 Nml Nml     Nerve Conduction Studies Anti Sensory Left/Right Comparison   Stim Site L Lat (ms) R Lat (ms) L-R Lat (ms) L Amp (V) R Amp (V) L-R Amp (%) Site1 Site2 L Vel (m/s) R Vel (m/s) L-R Vel (m/s)  Median Acr Palm Anti Sensory (2nd Digit)  32.3C  Wrist *5.1   15.6   Wrist Palm     Palm *3.1  3.7         Radial Anti Sensory (Base 1st Digit)  32.7C  Wrist 2.1   16.7   Wrist Base 1st Digit     Ulnar Anti Sensory (5th Digit)  32.7C  Wrist 3.7   17.1   Wrist 5th Digit 38     Motor Left/Right Comparison   Stim Site L Lat (ms) R Lat (ms) L-R Lat (ms) L Amp (mV) R Amp (mV) L-R Amp (%) Site1 Site2 L Vel (m/s) R Vel (m/s) L-R Vel (m/s)  Median Motor (Abd Poll Brev)  33.1C  Wrist *5.0   *4.9   Elbow Wrist *43    Elbow 10.1   3.0         Ulnar Motor (Abd Dig Min)  33.1C  Wrist 3.4   10.9   B Elbow Wrist 53    B Elbow 7.2   8.4   A Elbow B Elbow 59    A Elbow 8.9   6.9            Waveforms:

## 2017-04-25 NOTE — Progress Notes (Signed)
KAMRON VANWYHE - 62 y.o. male MRN [id number]  Date of birth: 22-Dec-1955  Office Visit Note: Visit Date: 04/22/2017 PCP: Dorothyann Peng, NP Referred by: Dorothyann Peng, NP  Subjective: Chief Complaint  Patient presents with  . Left Shoulder - Pain  . Left Elbow - Pain  . Left Hand - Pain, Numbness, Tingling   HPI: Mr. Selbe is a 62 year old right-hand-dominant gentleman who has been seeing Dr. Marlou Sa and Dr. Lorin Mercy in our office.  He has had worsening severe left shoulder pain since August 2018.  He reports pain in the left shoulder left elbow and left hand.  The left hand numbness is nondermatomal essentially all the fingers.  He reports when pressed that he may sometimes not get tingling in the thumb.  He reports worsening if he tries to lift his shoulder above his head.  He has found nothing so far to make any of this better.  He has had an MRI of the cervical spine which is reviewed below which is pretty significant for disc herniation as well as spondylosis narrowing.  He has had left shoulder MRI arthrogram showing very minimal issues.  Dr. Lorin Mercy asked for electrodiagnostic study to look for concomitant carpal tunnel problems and/or cervical radiculopathy.  The patient has had no prior electrodiagnostic studies.    ROS Otherwise per HPI.  Assessment & Plan: Visit Diagnoses:  1. Paresthesia of skin     Plan: No additional findings.  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   There is no significant electrodiagnostic evidence of any other focal nerve entrapment, brachial plexopathy or cervical radiculopathy.   ** As you know, this particular electrodiagnostic study cannot rule out chemical radiculitis or sensory only radiculopathy.  Recommendations: 1.  Follow-up with referring physician. 2.  Continue current  management of symptoms. 3.  Suggest surgical evaluation. Meds & Orders: No orders of the defined types were placed in this encounter.   Orders Placed This Encounter  Procedures  . NCV with EMG (electromyography)    Follow-up: Return for Dr. Lorin Mercy.   Procedures: No procedures performed  EMG & NCV Findings: Evaluation of the left median motor nerve showed prolonged distal onset latency (5.0 ms), reduced amplitude (4.9 mV), and decreased conduction velocity (Elbow-Wrist, 43 m/s).  The left median (across palm) sensory nerve showed prolonged distal peak latency (Wrist, 5.1 ms) and prolonged distal peak latency (Palm, 3.1 ms).  All remaining nerves (as indicated in the following tables) were within normal limits.    All examined muscles (as indicated in the following table) showed no evidence of electrical instability.    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   There is no significant electrodiagnostic evidence of any other focal nerve entrapment, brachial plexopathy or cervical radiculopathy.   ** As you know, this particular electrodiagnostic study cannot rule out chemical radiculitis or sensory only radiculopathy.  Recommendations: 1.  Follow-up with referring physician. 2.  Continue current management of symptoms. 3.  Suggest surgical evaluation.     Nerve Conduction Studies Anti Sensory Summary Table   Stim Site NR Peak (ms) Norm Peak (ms) P-T Amp (V) Norm P-T Amp Site1 Site2 Delta-P (ms) Dist (cm) Vel (m/s) Norm Vel (m/s)  Left Median Acr  Palm Anti Sensory (2nd Digit)  32.3C  Wrist    *5.1 <3.6 15.6 >10 Wrist Palm 2.0 0.0    Palm    *3.1 <2.0 3.7         Left Radial Anti Sensory (Base 1st Digit)  32.7C  Wrist    2.1 <3.1 16.7  Wrist Base 1st Digit 2.1 0.0    Left Ulnar Anti Sensory (5th Digit)  32.7C    Wrist    3.7 <3.7 17.1 >15.0 Wrist 5th Digit 3.7 14.0 38 >38   Motor Summary Table   Stim Site NR Onset (ms) Norm Onset (ms) O-P Amp (mV) Norm O-P Amp Site1 Site2 Delta-0 (ms) Dist (cm) Vel (m/s) Norm Vel (m/s)  Left Median Motor (Abd Poll Brev)  33.1C  Wrist    *5.0 <4.2 *4.9 >5 Elbow Wrist 5.1 22.0 *43 >50  Elbow    10.1  3.0         Left Ulnar Motor (Abd Dig Min)  33.1C  Wrist    3.4 <4.2 10.9 >3 B Elbow Wrist 3.8 20.0 53 >53  B Elbow    7.2  8.4  A Elbow B Elbow 1.7 10.0 59 >53  A Elbow    8.9  6.9          EMG   Side Muscle Nerve Root Ins Act Fibs Psw Amp Dur Poly Recrt Int Fraser Din Comment  Left 1stDorInt Ulnar C8-T1 Nml Nml Nml Nml Nml 0 Nml Nml   Left Abd Poll Brev Median C8-T1 Nml Nml Nml Nml Nml 0 Nml Nml   Left ExtDigCom   Nml Nml Nml Nml Nml 0 Nml Nml   Left Triceps Radial C6-7-8 Nml Nml Nml Nml Nml 0 Nml Nml   Left Deltoid Axillary C5-6 Nml Nml Nml Nml Nml 0 Nml Nml     Nerve Conduction Studies Anti Sensory Left/Right Comparison   Stim Site L Lat (ms) R Lat (ms) L-R Lat (ms) L Amp (V) R Amp (V) L-R Amp (%) Site1 Site2 L Vel (m/s) R Vel (m/s) L-R Vel (m/s)  Median Acr Palm Anti Sensory (2nd Digit)  32.3C  Wrist *5.1   15.6   Wrist Palm     Palm *3.1   3.7         Radial Anti Sensory (Base 1st Digit)  32.7C  Wrist 2.1   16.7   Wrist Base 1st Digit     Ulnar Anti Sensory (5th Digit)  32.7C  Wrist 3.7   17.1   Wrist 5th Digit 38     Motor Left/Right Comparison   Stim Site L Lat (ms) R Lat (ms) L-R Lat (ms) L Amp (mV) R Amp (mV) L-R Amp (%) Site1 Site2 L Vel (m/s) R Vel (m/s) L-R Vel (m/s)  Median Motor (Abd Poll Brev)  33.1C  Wrist *5.0   *4.9   Elbow Wrist *43    Elbow 10.1   3.0         Ulnar Motor (Abd Dig Min)  33.1C  Wrist 3.4   10.9   B Elbow Wrist 53    B Elbow 7.2   8.4   A Elbow B Elbow 59    A Elbow 8.9   6.9            Waveforms:            Clinical History: MRI CERVICAL SPINE WITHOUT CONTRAST  TECHNIQUE: Multiplanar, multisequence  MR imaging of the cervical spine was performed. No intravenous  contrast was administered.  COMPARISON:  Plain films cervical spine 01/10/2017.  FINDINGS: Alignment: Trace retrolisthesis C3 on C4 is noted.  Vertebrae: Height and signal are maintained.  Cord: Normal signal throughout.  Posterior Fossa, vertebral arteries, paraspinal tissues: Negative.  Disc levels:  C2-3: Minimal disc bulge and mild uncovertebral spurring on the left. The central canal and right foramen are widely patent. Mild left foraminal narrowing noted.  C3-4: There is a small central protrusion effacing the ventral thecal sac. Uncovertebral disease causes severe left foraminal narrowing. The right foramen is open.  C4-5: Uncovertebral disease causes moderately severe to severe bilateral foraminal narrowing. There is a shallow disc bulge but the central canal is widely patent.  C5-6: Disc bulge and right worse than left uncovertebral disease. The ventral thecal sac is narrowed but not effaced. Moderate to moderately severe foraminal narrowing is worse on the right.  C5-6: Broad-based left paracentral protrusion and uncovertebral disease are seen. Deformity of the ventral aspect of the cord on the left is identified. Moderately severe to severe foraminal narrowing is worse on the left.  C7-T1: There is some facet arthropathy. This level is otherwise negative.  IMPRESSION: Uncovertebral disease on the left at C3-4 causes severe foraminal narrowing. Shallow central protrusion at this level effaces the ventral thecal sac but the central canal is open.  Moderately severe to severe bilateral foraminal narrowing at C4-5 due to uncovertebral spurring. The central canal is widely patent.  Moderate to moderately severe bilateral foraminal narrowing at C5-6 appears worse on the right. Shallow disc bulge at this level effaces the ventral thecal sac.  Broad-based left sided disc protrusion at  C6-7 deforms the left side of the cord. Moderately severe to severe foraminal narrowing at this level is worse on the left.   Electronically Signed   By: Inge Rise M.D.  He reports that he has quit smoking. He has a 30.00 pack-year smoking history. he has never used smokeless tobacco.  Recent Labs    10/26/16 1739  HGBA1C 6.3    Objective:  VS:  HT:    WT:   BMI:     BP:133/89  HR:74bpm  TEMP:(!) 97.5 F (36.4 C)(Oral)  RESP:98 % Physical Exam  Musculoskeletal:  Inspection reveals no atrophy of the bilateral APB or FDI or hand intrinsics. There is no swelling, color changes, allodynia or dystrophic changes. There is 5 out of 5 strength in the bilateral wrist extension, finger abduction and long finger flexion.  There is subjectively impaired sensation in the left hand globally but predominantly the first 2 digits.  There is a positive Phalen's test on the left.  There is a negative Hoffmann's test bilaterally.  Patient does have pain trying to hold his left arm abduction or flexion.    Ortho Exam Imaging: No results found.  Past Medical/Family/Surgical/Social History: Medications & Allergies reviewed per EMR Patient Active Problem List   Diagnosis Date Noted  . Protrusion of cervical intervertebral disc 03/24/2017  . Left hip pain 03/24/2017  . Carpal tunnel syndrome of left wrist 12/15/2016  . Kidney stones 09/23/2016  . Heavy smoker (more than 20 cigarettes per day) 09/21/2016  . Sun-damaged skin 09/21/2016  . Morbid obesity with BMI of 45.0-49.9, adult ([place]) 09/20/2016  . OSA on CPAP 09/20/2016  . Other emphysema ([place]) 09/20/2016   Past Medical History:  Diagnosis Date  . Arthritis   . COPD (chronic obstructive pulmonary disease) ([place])   . History of kidney stones   . Hypertension  HX OF 1 YEAR AGO NEVER TOOK MEDS FOR  . Obesity   . Pre-diabetes   . Sleep apnea   . Tobacco use    Family History  Problem Relation Age of Onset  . Heart defect  Maternal Grandmother   . AAA (abdominal aortic aneurysm) Mother   . AAA (abdominal aortic aneurysm) Father   . Colon cancer Neg Hx   . Esophageal cancer Neg Hx   . Pancreatic cancer Neg Hx   . Rectal cancer Neg Hx   . Stomach cancer Neg Hx    Past Surgical History:  Procedure Laterality Date  . APPENDECTOMY    . CHOLECYSTECTOMY    . COLONOSCOPY N/A 01/04/2017   Procedure: COLONOSCOPY;  Surgeon: Doran Stabler, MD;  Location: [facility];  Service: Gastroenterology;  Laterality: N/A;   Social History   Occupational History  . Not on file  Tobacco Use  . Smoking status: Former Smoker    Packs/day: 1.00    Years: 30.00    Pack years: 30.00  . Smokeless tobacco: Never Used  . Tobacco comment: QUIT October 18 2016  Substance and Sexual Activity  . Alcohol use: No    Alcohol/week: 0.0 oz  . Drug use: No  . Sexual activity: Not on file

## 2017-04-27 ENCOUNTER — Ambulatory Visit: Payer: Self-pay | Admitting: *Deleted

## 2017-04-27 ENCOUNTER — Ambulatory Visit (INDEPENDENT_AMBULATORY_CARE_PROVIDER_SITE_OTHER): Payer: Self-pay | Admitting: Orthopaedic Surgery

## 2017-04-27 ENCOUNTER — Ambulatory Visit (INDEPENDENT_AMBULATORY_CARE_PROVIDER_SITE_OTHER): Payer: Self-pay | Admitting: Adult Health

## 2017-04-27 ENCOUNTER — Encounter: Payer: Self-pay | Admitting: Adult Health

## 2017-04-27 ENCOUNTER — Encounter (INDEPENDENT_AMBULATORY_CARE_PROVIDER_SITE_OTHER): Payer: Self-pay | Admitting: Orthopaedic Surgery

## 2017-04-27 VITALS — BP 136/89 | HR 76 | Ht 68.0 in | Wt 317.0 lb

## 2017-04-27 VITALS — BP 146/96 | Temp 98.2°F | Ht 68.0 in | Wt 323.6 lb

## 2017-04-27 DIAGNOSIS — R739 Hyperglycemia, unspecified: Secondary | ICD-10-CM

## 2017-04-27 DIAGNOSIS — G5602 Carpal tunnel syndrome, left upper limb: Secondary | ICD-10-CM

## 2017-04-27 DIAGNOSIS — M5020 Other cervical disc displacement, unspecified cervical region: Secondary | ICD-10-CM

## 2017-04-27 LAB — CBC WITH DIFFERENTIAL/PLATELET
BASOS ABS: 0.1 10*3/uL (ref 0.0–0.1)
Basophils Relative: 1 % (ref 0.0–3.0)
Eosinophils Absolute: 0.1 10*3/uL (ref 0.0–0.7)
Eosinophils Relative: 1.9 % (ref 0.0–5.0)
HEMATOCRIT: 43.1 % (ref 39.0–52.0)
Hemoglobin: 14.9 g/dL (ref 13.0–17.0)
LYMPHS PCT: 39.6 % (ref 12.0–46.0)
Lymphs Abs: 2.3 10*3/uL (ref 0.7–4.0)
MCHC: 34.5 g/dL (ref 30.0–36.0)
MCV: 87.9 fl (ref 78.0–100.0)
MONOS PCT: 6.7 % (ref 3.0–12.0)
Monocytes Absolute: 0.4 10*3/uL (ref 0.1–1.0)
NEUTROS PCT: 50.8 % (ref 43.0–77.0)
Neutro Abs: 3 10*3/uL (ref 1.4–7.7)
Platelets: 197 10*3/uL (ref 150.0–400.0)
RBC: 4.91 Mil/uL (ref 4.22–5.81)
RDW: 13.2 % (ref 11.5–15.5)
WBC: 5.9 10*3/uL (ref 4.0–10.5)

## 2017-04-27 LAB — COMPREHENSIVE METABOLIC PANEL
ALT: 19 U/L (ref 0–53)
AST: 17 U/L (ref 0–37)
Albumin: 3.8 g/dL (ref 3.5–5.2)
Alkaline Phosphatase: 111 U/L (ref 39–117)
BILIRUBIN TOTAL: 0.6 mg/dL (ref 0.2–1.2)
BUN: 8 mg/dL (ref 6–23)
CO2: 28 meq/L (ref 19–32)
Calcium: 9 mg/dL (ref 8.4–10.5)
Chloride: 97 mEq/L (ref 96–112)
Creatinine, Ser: 0.87 mg/dL (ref 0.40–1.50)
GFR: 95.31 mL/min (ref >60.00)
GLUCOSE: 539 mg/dL — AB (ref 70–99)
POTASSIUM: 4 meq/L (ref 3.5–5.1)
Sodium: 133 mEq/L — ABNORMAL LOW (ref 135–145)
Total Protein: 6.6 g/dL (ref 6.0–8.3)

## 2017-04-27 LAB — POCT GLYCOSYLATED HEMOGLOBIN (HGB A1C): HEMOGLOBIN A1C: 12.8

## 2017-04-27 LAB — LIPASE: LIPASE: 50 U/L (ref 11.0–59.0)

## 2017-04-27 LAB — GLUCOSE, POCT (MANUAL RESULT ENTRY): POC GLUCOSE: 516 mg/dL — AB (ref 70–99)

## 2017-04-27 LAB — SEDIMENTATION RATE: Sed Rate: 20 mm/hr (ref 0–20)

## 2017-04-27 LAB — C-REACTIVE PROTEIN: CRP: 1.3 mg/dL (ref 0.5–20.0)

## 2017-04-27 MED ORDER — GLIPIZIDE 5 MG PO TABS: 5.0000 mg | ORAL_TABLET | Freq: Two times a day (BID) | ORAL | 3 refills | Status: DC

## 2017-04-27 NOTE — Progress Notes (Signed)
Subjective:    Patient ID: Jerome Sims, male    DOB: Jun 08, 1955, 62 y.o.   MRN: [id number]  HPI  62 year old male who  has a past medical history of Arthritis, COPD (chronic obstructive pulmonary disease) ([place]), History of kidney stones, Hypertension, Obesity, Pre-diabetes, Sleep apnea, and Tobacco use. He presents to the office today after being seen in his orthopedics office today where he was supposed to have surgery on his rotator cuff. It was found that his blood sugar was found to be 516.   His last A1c in July 2018 was 6.3.   Today in the office he reports that his diet has been " horrible, I have been eating a lot of fried foods and drinking a lot of soda." In addition to this he has been in pain due to shoulder and neck injury and has been under a lot of stress.   He endorses increased urination and increased thirst. He has not had any blurred vision, headaches, fevers, or feeling ill.   Denies any drug use. Has not been on prednisone recently.   He denies ever being on diabetes medication   Review of Systems See HPI   Past Medical History:  Diagnosis Date  . Arthritis   . COPD (chronic obstructive pulmonary disease) ([place])   . History of kidney stones   . Hypertension    HX OF 1 YEAR AGO NEVER TOOK MEDS FOR  . Obesity   . Pre-diabetes   . Sleep apnea   . Tobacco use     Social History   Socioeconomic History  . Marital status: Single    Spouse name: Not on file  . Number of children: Not on file  . Years of education: Not on file  . Highest education level: Not on file  Social Needs  . Financial resource strain: Not on file  . Food insecurity - worry: Not on file  . Food insecurity - inability: Not on file  . Transportation needs - medical: Not on file  . Transportation needs - non-medical: Not on file  Occupational History  . Not on file  Tobacco Use  . Smoking status: Former Smoker    Packs/day: 1.00    Years: 30.00    Pack years: 30.00  .  Smokeless tobacco: Never Used  . Tobacco comment: QUIT October 18 2016  Substance and Sexual Activity  . Alcohol use: No    Alcohol/week: 0.0 oz  . Drug use: No  . Sexual activity: Not on file  Other Topics Concern  . 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     Past Surgical History:  Procedure Laterality Date  . APPENDECTOMY    . CHOLECYSTECTOMY    . COLONOSCOPY N/A 01/04/2017   Procedure: COLONOSCOPY;  Surgeon: Doran Stabler, MD;  Location: [facility];  Service: Gastroenterology;  Laterality: N/A;    Family History  Problem Relation Age of Onset  . Heart defect Maternal Grandmother   . AAA (abdominal aortic aneurysm) Mother   . AAA (abdominal aortic aneurysm) Father   . Colon cancer Neg Hx   . Esophageal cancer Neg Hx   . Pancreatic cancer Neg Hx   . Rectal cancer Neg Hx   . Stomach cancer Neg Hx     No  Known Allergies  Current Outpatient Medications on File Prior to Visit  Medication Sig Dispense Refill  . cyclobenzaprine (FLEXERIL) 10 MG tablet Take 1 tablet (10 mg total) by mouth 3 (three) times daily as needed for muscle spasms. (Patient not taking: Reported on 04/27/2017) 30 tablet 0  . diclofenac (VOLTAREN) 75 MG EC tablet Take 1 tablet (75 mg total) by mouth 2 (two) times daily. (Patient not taking: Reported on 04/27/2017) 60 tablet 11  . fluticasone furoate-vilanterol (BREO ELLIPTA) 100-25 MCG/INH AEPB Inhale 1 puff into the lungs daily. (Patient not taking: Reported on 04/27/2017) 28 each 11  . phentermine 15 MG capsule Take 1 capsule (15 mg total) by mouth every morning. (Patient not taking: Reported on 04/27/2017) 30 capsule 0  . triamcinolone ointment (KENALOG) 0.5 % Apply 1 application topically 2 (two) times daily. (Patient not taking: Reported on 04/27/2017) 30 g 6   No current facility-administered medications on file prior to visit.     BP  (!) 146/96 (BP Location: Right Arm, Patient Position: Sitting)   Temp 98.2 F (36.8 C) (Oral)   Ht 5\' 8"  (1.727 m)   Wt (!) 323 lb 9.6 oz (146.8 kg)   BMI 49.20 kg/m       Objective:   Physical Exam  Constitutional: He is oriented to person, place, and time. He appears well-developed and well-nourished. No distress.  Obese    Cardiovascular: Normal rate, regular rhythm, normal heart sounds and intact distal pulses. Exam reveals no friction rub.  No murmur heard. Pulmonary/Chest: Effort normal and breath sounds normal. No respiratory distress. He has no wheezes. He has no rales. He exhibits no tenderness.  Abdominal: Soft. Bowel sounds are normal. He exhibits no distension and no mass. There is no tenderness. There is no rebound and no guarding.  Neurological: He is alert and oriented to person, place, and time.  Skin: Skin is warm and dry. No rash noted. He is not diaphoretic. No erythema. No pallor.  Psychiatric: He has a normal mood and affect. His behavior is normal. Judgment and thought content normal.  Nursing note and vitals reviewed.     Assessment & Plan:  1. Hyperglycemia - Will start on Glipizde. He does not have insurance and cannot afford insulin at this time. I am unsure why his blood sugars have gone up so fast over the last 5-6 months, likely diet related but unsure if that is main cause. Consider referral to endocrinology  - Follow up in one week  - POC Glucose (CBG) - POCT A1C - CMP - Lipase - CBC with Differential/Platelet - glipiZIDE (GLUCOTROL) 5 MG tablet; Take 1 tablet (5 mg total) by mouth 2 (two) times daily before a meal.  Dispense: 60 tablet; Refill: 3 - Sedimentation Rate - C-reactive Protein - Insulin and C-Peptide  Dorothyann Peng, NP

## 2017-04-27 NOTE — Telephone Encounter (Signed)
Called patient and scheduled same day appt for 2:30 today.

## 2017-04-27 NOTE — Progress Notes (Signed)
Office Visit Note   Patient: Jerome Sims           Date of Birth: 1955/08/30           MRN: [id number] Visit Date: 04/27/2017              Requested by: Dorothyann Peng, NP [address], [place] [id number] PCP: Dorothyann Peng, NP   Assessment & Plan: Visit Diagnoses:  1. Hyperglycemia   2. Carpal tunnel syndrome of left wrist   3. Protrusion of cervical intervertebral disc     Plan: The patient go to his primary care physician today.  CBG is 531 and needs treatment.  Plan to check him back again in 4 weeks.  He needs to get his diabetes taking care of appropriate treatment he understands he has moderately severe left carpal tunnel syndrome as well as.  Stable he can return we will check him in 4 weeks.  I discussed with him that he cannot have any elective surgery until his diabetes is taken care of.  Follow-Up Instructions: Return in about 4 weeks (around 05/25/2017).   Orders:  No orders of the defined types were placed in this encounter.  No orders of the defined types were placed in this encounter.     Procedures: No procedures performed   Clinical Data: No additional findings.   Subjective: Chief Complaint  Patient presents with  . Left Arm - Follow-up, Pain    HPI 62 year old male returns with moderate to severe carpal tunnel nerve conduction velocities 04/22/2017 by Dr. Ernestina Patches.  He states he has not been feeling very good and CBG checks shows that at 531.  Discussed and then he needs to go to his primary care physician now.  Initially told me he did not have diabetes today we discussed this in detail now he does understand he does have diabetes.  Review of Systems last office visit.  Remains out of work at this point.  Cervical MRI scan showed cervical disc degeneration with C6-7 left paracentral disc protrusion.  Former smoker.  He has hip osteoarthritis worse on the left than right.   Objective: Vital Signs: BP 136/89   Pulse 76   Ht 5\' 8"   (1.727 m)   Wt (!) 317 lb (143.8 kg)   BMI 48.20 kg/m   Physical Exam  Constitutional: He is oriented to person, place, and time. He appears well-developed and well-nourished.  HENT:  Head: Normocephalic and atraumatic.  Eyes: EOM are normal. Pupils are equal, round, and reactive to light.  Neck: No tracheal deviation present. No thyromegaly present.  Cardiovascular: Normal rate.  Pulmonary/Chest: Effort normal. He has no wheezes.  Abdominal: Soft. Bowel sounds are normal.  Neurological: He is alert and oriented to person, place, and time.  Skin: Skin is warm and dry. Capillary refill takes less than 2 seconds.  Psychiatric: He has a normal mood and affect. His behavior is normal. Judgment and thought content normal.    Ortho Exam positive Phalen's positive Tinel's is brachial plexus tenderness on the left positive Spurling on the left.  No lower extremity hyperreflexia. Specialty Comments:  No specialty comments available.  Imaging: No results found.   PMFS History: Patient Active Problem List   Diagnosis Date Noted  . Protrusion of cervical intervertebral disc 03/24/2017  . Left hip pain 03/24/2017  . Carpal tunnel syndrome of left wrist 12/15/2016  . Kidney stones 09/23/2016  . Heavy smoker (more than 20 cigarettes per day) 09/21/2016  .  Sun-damaged skin 09/21/2016  . Morbid obesity with BMI of 45.0-49.9, adult ([place]) 09/20/2016  . OSA on CPAP 09/20/2016  . Other emphysema ([place]) 09/20/2016   Past Medical History:  Diagnosis Date  . Arthritis   . COPD (chronic obstructive pulmonary disease) ([place])   . History of kidney stones   . Hypertension    HX OF 1 YEAR AGO NEVER TOOK MEDS FOR  . Obesity   . Pre-diabetes   . Sleep apnea   . Tobacco use     Family History  Problem Relation Age of Onset  . Heart defect Maternal Grandmother   . AAA (abdominal aortic aneurysm) Mother   . AAA (abdominal aortic aneurysm) Father   . Colon cancer Neg Hx   . Esophageal cancer Neg  Hx   . Pancreatic cancer Neg Hx   . Rectal cancer Neg Hx   . Stomach cancer Neg Hx     Past Surgical History:  Procedure Laterality Date  . APPENDECTOMY    . CHOLECYSTECTOMY    . COLONOSCOPY N/A 01/04/2017   Procedure: COLONOSCOPY;  Surgeon: Doran Stabler, MD;  Location: [facility];  Service: Gastroenterology;  Laterality: N/A;   Social History   Occupational History  . Not on file  Tobacco Use  . Smoking status: Former Smoker    Packs/day: 1.00    Years: 30.00    Pack years: 30.00  . Smokeless tobacco: Never Used  . Tobacco comment: QUIT October 18 2016  Substance and Sexual Activity  . Alcohol use: No    Alcohol/week: 0.0 oz  . Drug use: No  . Sexual activity: Not on file

## 2017-04-27 NOTE — Telephone Encounter (Signed)
Pt called because he is in his orthopedic office and to schedule and was told that his CBG was 531; nurse triage initiated and recommendation for pt to see physician; pt previously seen at [facility]; no appointments with provider that previously cared for pt;  will route this it [facility] for appointment; spoke with Sunday Spillers regarding this issue; also spoke with Ander Purpura at [facility] orthopedics to make her aware that attempts are being made for the pt to be seen in his PCP's office today;  pt can be contacted at [phone] for appointment time. Reason for Disposition . Blood glucose > 500 mg/dl (27.5 mmol/l)  Answer Assessment - Initial Assessment Questions 1. BLOOD GLUCOSE: "What is your blood glucose level?"      531 today 04/27/17 2. ONSET: "When did you check the blood glucose?"     04/27/17 at MD's office 3. USUAL RANGE: "What is your glucose level usually?" (e.g., usual fasting morning value, usual evening value)     Unsure; does not check 4. KETONES: "Do you check for ketones (urine or blood test strips)?" If yes, ask: "What does the test show now?"      no 5. TYPE 1 or 2:  "Do you know what type of diabetes you have?"  (e.g., Type 1, Type 2, Gestational; doesn't know)      Type 2 6. INSULIN: "Do you take insulin?" If yes, ask: "Have you missed any shots recently?"     no 7. DIABETES PILLS: "Do you take any pills for your diabetes?" If yes, ask: "Have you missed taking any pills recently?"     no 8. OTHER SYMPTOMS: "Do you have any symptoms?" (e.g., fever, frequent urination, difficulty breathing, dizziness, weakness, vomiting)     Pt have vomiting the night before (Monday 04/26/17); occassional shortness of breath  9. PREGNANCY: "Is there any chance you are pregnant?" "When was your last menstrual period?"     n/a  Protocols used: DIABETES - HIGH BLOOD SUGAR-A-AH

## 2017-04-28 ENCOUNTER — Telehealth: Payer: Self-pay | Admitting: Adult Health

## 2017-04-28 ENCOUNTER — Other Ambulatory Visit: Payer: Self-pay | Admitting: Adult Health

## 2017-04-28 ENCOUNTER — Other Ambulatory Visit: Payer: Self-pay | Admitting: Family Medicine

## 2017-04-28 DIAGNOSIS — E119 Type 2 diabetes mellitus without complications: Secondary | ICD-10-CM

## 2017-04-28 LAB — INSULIN AND C-PEPTIDE, SERUM
C-Peptide: 8.4 ng/mL — ABNORMAL HIGH (ref 1.1–4.4)
INSULIN: 84.5 u[IU]/mL — AB (ref 2.6–24.9)

## 2017-04-28 MED ORDER — ONETOUCH ULTRASOFT LANCETS MISC: 12 refills | Status: DC

## 2017-04-28 MED ORDER — GLUCOSE BLOOD VI STRP: ORAL_STRIP | 12 refills | Status: DC

## 2017-04-28 NOTE — Telephone Encounter (Signed)
Left vm to call back regarding labs and would like to refer him to endocrinology

## 2017-04-28 NOTE — Telephone Encounter (Signed)
Spoke with Jerome Sims and he is okay with seeing endocrinology.  He will follow up in 1 week

## 2017-04-29 ENCOUNTER — Encounter (INDEPENDENT_AMBULATORY_CARE_PROVIDER_SITE_OTHER): Payer: Self-pay | Admitting: Orthopaedic Surgery

## 2017-04-29 ENCOUNTER — Other Ambulatory Visit: Payer: Self-pay | Admitting: *Deleted

## 2017-04-29 MED ORDER — ONETOUCH ULTRASOFT LANCETS MISC: 12 refills | Status: AC

## 2017-04-29 MED ORDER — GLUCOSE BLOOD VI STRP: ORAL_STRIP | 12 refills | Status: AC

## 2017-04-29 NOTE — Telephone Encounter (Signed)
Patient came into office and requested paper copy of scripts to take to a different pharmacy. Rxs printed, signed by PCP, and given to patient.

## 2017-05-05 ENCOUNTER — Encounter: Payer: Self-pay | Admitting: Adult Health

## 2017-05-05 ENCOUNTER — Ambulatory Visit (INDEPENDENT_AMBULATORY_CARE_PROVIDER_SITE_OTHER): Payer: Self-pay | Admitting: Adult Health

## 2017-05-05 VITALS — BP 130/80 | Temp 97.9°F | Wt 324.0 lb

## 2017-05-05 DIAGNOSIS — E119 Type 2 diabetes mellitus without complications: Secondary | ICD-10-CM

## 2017-05-05 LAB — GLUCOSE, POCT (MANUAL RESULT ENTRY): POC GLUCOSE: 151 mg/dL — AB (ref 70–99)

## 2017-05-05 MED ORDER — GLIPIZIDE ER 5 MG PO TB24
5.0000 mg | ORAL_TABLET | Freq: Every day | ORAL | 3 refills | Status: DC
Start: 2017-05-05 — End: 2017-06-03

## 2017-05-05 NOTE — Progress Notes (Signed)
Subjective:    Patient ID: Jerome Sims, male    DOB: [birth year], 62 y.o.   MRN: [id number]  HPI  62 year old male who presents to the office today for follow-up regarding diabetes.  I last saw him 1 week ago at which time he had come from the orthopedics office he was supposed to have surgery on his rotator cuff.  At the orthopedics office blood work was done and it showed a blood sugar of 500+.  Our office his 12.8.  He endorsed increased urination and increased thirst.  He did admit that his diet has been "horrible" he was eating a lot of fast food and drinking a lot of soda.  Previous to that in July 2018 his A1c was 6.3.  He had never been placed on any type of medic medication in the past.  He was asked to completely change his diet and was started on glipizide 5 mg to see how he would respond.  He was given a glucometer to start monitoring his blood sugars at home  Today in the office he reports he is checking his blood sugars daily on have been dropping about 30 points. It was 144 this morning and 142 last night.  He has not had any blood sugars in the 400 or 500's since starting glipizide   He reports that he has changed his diet completely ,he is eating salads and lean meats, he is using protein drinks throughout the day to help with cravings. He has had one diet soda over the week and is not eating fast food any longer. He has multiple friends with diabetes and have had to have amputations, he states " I want to keep my feet".   He has had once sounds like hypoglycemia events over the last week. He has been waking up a majority of the mornings soaked in sweat and has felt lightheaded once.   Review of Systems See HPI   Past Medical History:  Diagnosis Date  . Arthritis   . COPD (chronic obstructive pulmonary disease) ([place])   . History of kidney stones   . Hypertension    HX OF 1 YEAR AGO NEVER TOOK MEDS FOR  . Obesity   . Pre-diabetes   . Sleep apnea   . Tobacco use      Social History   Socioeconomic History  . Marital status: Single    Spouse name: Not on file  . Number of children: Not on file  . Years of education: Not on file  . Highest education level: Not on file  Social Needs  . Financial resource strain: Not on file  . Food insecurity - worry: Not on file  . Food insecurity - inability: Not on file  . Transportation needs - medical: Not on file  . Transportation needs - non-medical: Not on file  Occupational History  . Not on file  Tobacco Use  . Smoking status: Former Smoker    Packs/day: 1.00    Years: 30.00    Pack years: 30.00  . Smokeless tobacco: Never Used  . Tobacco comment: QUIT October 18 2016  Substance and Sexual Activity  . Alcohol use: No    Alcohol/week: 0.0 oz  . Drug use: No  . Sexual activity: Not on file  Other Topics Concern  . 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     Past Surgical History:  Procedure Laterality Date  . APPENDECTOMY    . CHOLECYSTECTOMY    . COLONOSCOPY N/A 01/04/2017   Procedure: COLONOSCOPY;  Surgeon: Doran Stabler, MD;  Location: [facility];  Service: Gastroenterology;  Laterality: N/A;    Family History  Problem Relation Age of Onset  . Heart defect Maternal Grandmother   . AAA (abdominal aortic aneurysm) Mother   . AAA (abdominal aortic aneurysm) Father   . Colon cancer Neg Hx   . Esophageal cancer Neg Hx   . Pancreatic cancer Neg Hx   . Rectal cancer Neg Hx   . Stomach cancer Neg Hx     No Known Allergies  Current Outpatient Medications on File Prior to Visit  Medication Sig Dispense Refill  . glucose blood (ONETOUCH VERIO) test strip USE TO TEST BLOOD GLUCOSE ONCE DAILY 100 each 12  . Lancets (ONETOUCH ULTRASOFT) lancets USE TO TEST BLOOD GLUCOSE ONCE DAILY 100 each 12  . triamcinolone ointment (KENALOG) 0.5 % Apply 1 application topically 2  (two) times daily. 30 g 6   No current facility-administered medications on file prior to visit.     BP 130/80 (BP Location: Left Arm)   Temp 97.9 F (36.6 C) (Oral)   Wt (!) 324 lb (147 kg)   BMI 49.26 kg/m       Objective:   Physical Exam  Constitutional: He is oriented to person, place, and time. He appears well-developed and well-nourished. No distress.  Obese   Cardiovascular: Normal rate, regular rhythm, normal heart sounds and intact distal pulses. Exam reveals no gallop and no friction rub.  No murmur heard. Pulmonary/Chest: Effort normal and breath sounds normal. No respiratory distress. He has no wheezes. He has no rales. He exhibits no tenderness.  Neurological: He is alert and oriented to person, place, and time.  Skin: Skin is warm and dry. He is not diaphoretic.  Nursing note and vitals reviewed.     Assessment & Plan:  1. Type 2 diabetes mellitus without complication, without long-term current use of insulin (HCC) - significant improvement in BS readings. Encouraged continued compliance with diet. I am going to switch him to glipizide XL. Advised to monitor his BS at home and report if he continues to have hypoglycemic events and if he notices his BS creeping up.  - POC Glucose (CBG)- 151 - glipiZIDE (GLIPIZIDE XL) 5 MG 24 hr tablet; Take 1 tablet (5 mg total) by mouth daily with breakfast.  Dispense: 30 tablet; Refill: 3 - Follow up in 3 months for A1c   Dorothyann Peng, NP

## 2017-05-06 ENCOUNTER — Telehealth (INDEPENDENT_AMBULATORY_CARE_PROVIDER_SITE_OTHER): Payer: Self-pay | Admitting: Orthopaedic Surgery

## 2017-05-06 NOTE — Telephone Encounter (Signed)
Patient came in the office to discuss a surgery date for L-CTR and ACDF and was pushing for a surgery date within 1 week.  Patient stated his blood sugar is under control now since he was at his last visit with Dr. Lorin Mercy when his A1C was 12.8.  I spoke with Gwinda Passe regarding patient's desire to have surgery so quickly and she advised me to let the patient know (per Dr. Lorin Mercy) it is unsafe to perform surgery at this time, and patient will have to be checked again in 3 months.  Patient understands why surgery cannot be performed at this time and will continue to be followed by Dr. Carlisle Cater with hopes of being able to have the surgery in the near future.

## 2017-05-06 NOTE — Telephone Encounter (Signed)
noted 

## 2017-05-20 ENCOUNTER — Encounter (INDEPENDENT_AMBULATORY_CARE_PROVIDER_SITE_OTHER): Payer: Self-pay | Admitting: Ophthalmology

## 2017-05-20 DIAGNOSIS — H2513 Age-related nuclear cataract, bilateral: Secondary | ICD-10-CM

## 2017-05-20 DIAGNOSIS — H353111 Nonexudative age-related macular degeneration, right eye, early dry stage: Secondary | ICD-10-CM

## 2017-05-20 DIAGNOSIS — H43813 Vitreous degeneration, bilateral: Secondary | ICD-10-CM

## 2017-05-31 ENCOUNTER — Ambulatory Visit (INDEPENDENT_AMBULATORY_CARE_PROVIDER_SITE_OTHER): Payer: Self-pay | Admitting: Endocrinology

## 2017-05-31 ENCOUNTER — Encounter (INDEPENDENT_AMBULATORY_CARE_PROVIDER_SITE_OTHER): Payer: Self-pay | Admitting: Orthopaedic Surgery

## 2017-05-31 ENCOUNTER — Ambulatory Visit (INDEPENDENT_AMBULATORY_CARE_PROVIDER_SITE_OTHER): Payer: Self-pay | Admitting: Orthopaedic Surgery

## 2017-05-31 ENCOUNTER — Encounter: Payer: Self-pay | Admitting: Endocrinology

## 2017-05-31 VITALS — BP 147/89 | HR 67

## 2017-05-31 DIAGNOSIS — M7052 Other bursitis of knee, left knee: Secondary | ICD-10-CM

## 2017-05-31 DIAGNOSIS — G5602 Carpal tunnel syndrome, left upper limb: Secondary | ICD-10-CM

## 2017-05-31 DIAGNOSIS — M5412 Radiculopathy, cervical region: Secondary | ICD-10-CM

## 2017-05-31 DIAGNOSIS — E119 Type 2 diabetes mellitus without complications: Secondary | ICD-10-CM

## 2017-05-31 NOTE — Progress Notes (Signed)
Subjective:    Patient ID: Jerome Sims, male    DOB: [birth year], 62 y.o.   MRN: [id number]  HPI pt is referred by Dorothyann Peng, NP, for diabetes.  Pt states DM was dx'ed last month (a1c was 6.3 in mid-2018); he has mild if any neuropathy of the lower extremities; he is unaware of any associated chronic complications; he has never been on insulin; pt says his diet is good, but exercise is limited by health problems; he has never had pancreatitis, pancreatic surgery, severe hypoglycemia or DKA.  He had neck injury in mid-2018, and glucose was found to be 553.  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  No recent steroids.  He says over the past month, cbg's have improved to the low-100's, on glipizide.    Past Medical History:  Diagnosis Date  . Arthritis   . COPD (chronic obstructive pulmonary disease) ([place])   . History of kidney stones   . Hypertension    HX OF 1 YEAR AGO NEVER TOOK MEDS FOR  . Obesity   . Pre-diabetes   . Sleep apnea   . Tobacco use     Past Surgical History:  Procedure Laterality Date  . APPENDECTOMY    . CHOLECYSTECTOMY    . COLONOSCOPY N/A 01/04/2017   Procedure: COLONOSCOPY;  Surgeon: Doran Stabler, MD;  Location: [facility];  Service: Gastroenterology;  Laterality: N/A;    Social History   Socioeconomic History  . Marital status: Single    Spouse name: Not on file  . Number of children: Not on file  . Years of education: Not on file  . Highest education level: Not on file  Social Needs  . Financial resource strain: Not on file  . Food insecurity - worry: Not on file  . Food insecurity - inability: Not on file  . Transportation needs - medical: Not on file  . Transportation needs - non-medical: Not on file  Occupational History  . Not on file  Tobacco Use  . Smoking status: Former Smoker    Packs/day: 1.00    Years: 30.00    Pack years: 30.00  . Smokeless tobacco: Never Used  . Tobacco comment: QUIT  October 18 2016  Substance and Sexual Activity  . Alcohol use: No    Alcohol/week: 0.0 oz  . Drug use: No  . Sexual activity: Not on file  Other Topics Concern  . 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     Current Outpatient Medications on File Prior to Visit  Medication Sig Dispense Refill  . glipiZIDE (GLIPIZIDE XL) 5 MG 24 hr tablet Take 1 tablet (5 mg total) by mouth daily with breakfast. 30 tablet 3  . glucose blood (ONETOUCH VERIO) test strip USE TO TEST BLOOD GLUCOSE ONCE DAILY 100 each 12  . Lancets (ONETOUCH ULTRASOFT) lancets USE TO TEST BLOOD GLUCOSE ONCE DAILY 100 each 12  . triamcinolone ointment (KENALOG) 0.5 % Apply 1 application topically 2 (two) times daily. 30 g 6   No current facility-administered medications on file prior to visit.     No Known Allergies  Family History  Problem Relation Age of Onset  . Heart defect Maternal Grandmother   . AAA (abdominal aortic aneurysm) Mother   .  AAA (abdominal aortic aneurysm) Father   . Colon cancer Neg Hx   . Esophageal cancer Neg Hx   . Pancreatic cancer Neg Hx   . Rectal cancer Neg Hx   . Stomach cancer Neg Hx   . Diabetes Neg Hx     BP (!) 151/101 (BP Location: Left Arm, Patient Position: Sitting, Cuff Size: Normal)   Pulse 63   Wt (!) 326 lb 6.4 oz (148.1 kg)   SpO2 96%   BMI 49.63 kg/m     Review of Systems denies weight loss, blurry vision, headache, chest pain, sob, n/v, muscle cramps, excessive diaphoresis, memory loss, depression, cold intolerance, rhinorrhea.  He has easy bruising and nocturia.     Objective:   Physical Exam VS: see vs page GEN: no distress HEAD: head: no deformity eyes: no periorbital swelling, no proptosis external nose and ears are normal mouth: no lesion seen NECK: supple, thyroid is not enlarged CHEST WALL: no deformity LUNGS: clear to auscultation CV:  reg rate and rhythm, no murmur.   ABD: abdomen is soft, nontender.  no hepatosplenomegaly.  not distended.  no hernia MUSCULOSKELETAL: muscle bulk and strength are grossly normal.  no obvious joint swelling.  gait is normal and steady EXTEMITIES: no deformity.  no ulcer on the feet.  feet are of normal color and temp.  1+ bilat leg edema.  There is bilateral onychomycosis of the toenails PULSES: dorsalis pedis intact bilat.  no carotid bruit NEURO:  cn 2-12 grossly intact.   readily moves all 4's.  sensation is intact to touch on the feet SKIN:  Normal texture and temperature.  No rash or suspicious lesion is visible.   NODES:  None palpable at the neck.  PSYCH: alert, well-oriented.  Does not appear anxious nor depressed.  Lab Results  Component Value Date   HGBA1C 12.8 04/27/2017   Lab Results  Component Value Date   CREATININE 0.87 04/27/2017   BUN 8 04/27/2017   NA 133 (L) 04/27/2017   K 4.0 04/27/2017   CL 97 04/27/2017   CO2 28 04/27/2017   I have reviewed outside records, and summarized: Pt was noted to have elevated a1c, and referred here.  He needs confirmation of improved glycemic control, so he can undergo C-spine fusion     Assessment & Plan:  Type 2 DM: glycemic control is apparently much better C-spine Dz, new to me.  He needs surgical clearance  Patient Instructions  good diet and exercise significantly improve the control of your diabetes.  please let me know if you wish to be referred to a dietician.  high blood sugar is very risky to your health.  you should see an eye doctor and dentist every year.  It is very important to get all recommended vaccinations.  Controlling your blood pressure and cholesterol drastically reduces the damage diabetes does to your body.  Those who smoke should quit.  Please discuss these with your doctor.  At our office, we are fortunate to have two specialists who are happy to help you:   Leonia Reader, RN, CDE, is a diabetes educator  and pump trainer.  She is here on Monday mornings, and all day Tuesday and Wednesday.  She is can help you with low blood sugar avoidance and treatment, injecting insulin, sick day management, and others.   Antonieta Iba, RD is our dietician.  She is here all day Thursday and Friday.  She can advise you about a healthy diet.  She can  also help you about a variety of special diabetes situations, such as shift work, Actor, gluten-free, diet for kidney patients, traveling with diabetes, and help for those who need to gain weight.   check your blood sugar once a day.  vary the time of day when you check, between before the 3 meals, and at bedtime.  also check if you have symptoms of your blood sugar being too high or too low.  please keep a record of the readings and bring it to your next appointment here (or you can bring the meter itself).  You can write it on any piece of paper.  please call us sooner if your blood sugar goes below 70, or if you have a lot of readings over 200.  blood tests are requested for you today.  We'll let you know about the results.  Based on the results, I hope to adjust your meds (understanding your lack of insurance), and clear you for the surgery.

## 2017-05-31 NOTE — Patient Instructions (Addendum)
good diet and exercise significantly improve the control of your diabetes.  please let me know if you wish to be referred to a dietician.  high blood sugar is very risky to your health.  you should see an eye doctor and dentist every year.  It is very important to get all recommended vaccinations.  Controlling your blood pressure and cholesterol drastically reduces the damage diabetes does to your body.  Those who smoke should quit.  Please discuss these with your doctor.  At our office, we are fortunate to have two specialists who are happy to help you:   Leonia Reader, RN, CDE, is a diabetes educator and pump trainer.  She is here on Monday mornings, and all day Tuesday and Wednesday.  She is can help you with low blood sugar avoidance and treatment, injecting insulin, sick day management, and others.   Antonieta Iba, RD is our dietician.  She is here all day Thursday and Friday.  She can advise you about a healthy diet.  She can also help you about a variety of special diabetes situations, such as shift work, Actor, gluten-free, diet for kidney patients, traveling with diabetes, and help for those who need to gain weight.   check your blood sugar once a day.  vary the time of day when you check, between before the 3 meals, and at bedtime.  also check if you have symptoms of your blood sugar being too high or too low.  please keep a record of the readings and bring it to your next appointment here (or you can bring the meter itself).  You can write it on any piece of paper.  please call us sooner if your blood sugar goes below 70, or if you have a lot of readings over 200.  blood tests are requested for you today.  We'll let you know about the results.  Based on the results, I hope to adjust your meds (understanding your lack of insurance), and clear you for the surgery.

## 2017-05-31 NOTE — Progress Notes (Signed)
Office Visit Note   Patient: Jerome Sims           Date of Birth: 1956-02-16           MRN: [id number] Visit Date: 05/31/2017              Requested by: Dorothyann Peng, NP [place], [place] [address] PCP: Dorothyann Peng, NP   Assessment & Plan: Visit Diagnoses:  1. Pes anserinus bursitis of left knee   2. Carpal tunnel syndrome of left wrist   3. Cervical radiculopathy     Plan: Patient was given samples of Pennsaid to apply twice daily to the pes bursa area.  We are awaiting clearance from endocrinology.  Patient was advised to let us know when he sees Dr. Loanne Drilling we can find out if he is cleared for surgery.  Follow-Up Instructions: Return in about 6 weeks (around 07/12/2017) for with [place].   Orders:  No orders of the defined types were placed in this encounter.  No orders of the defined types were placed in this encounter.     Procedures: No procedures performed   Clinical Data: No additional findings.   Subjective: Chief Complaint  Patient presents with  . Neck - Pain, Follow-up  . Left Wrist - Pain    HPI Patient returns.  He did see Dr. Renato Shin endocrinologist today.  Labs were ordered and we are awaiting clearance for surgery.  States that his symptoms are unchanged from his last visit with [place] regarding his neck and carpal tunnel.  He does have a new complaint today of left lower leg pain.  Localizes most of his pain to the pes bursa area.  This has been ongoing for a couple weeks.  No injury. Review of Systems   Objective: Vital Signs: BP (!) 147/89   Pulse 67   Physical Exam  Constitutional: He is oriented to person, place, and time. No distress.  HENT:  Head: Normocephalic.  Eyes: Pupils are equal, round, and reactive to light.  Pulmonary/Chest: No respiratory distress.  Musculoskeletal:  Left knee good range of motion.  Does have some tenderness at the joint line.  He is moderately tender over the left pes bursa and  there is some swelling.  Calf is nontender.  Neurological: He is alert and oriented to person, place, and time.    Ortho Exam  Specialty Comments:  No specialty comments available.  Imaging: No results found.   PMFS History: Patient Active Problem List   Diagnosis Date Noted  . Diabetes ([place]) 05/31/2017  . Protrusion of cervical intervertebral disc 03/24/2017  . Left hip pain 03/24/2017  . Carpal tunnel syndrome of left wrist 12/15/2016  . Kidney stones 09/23/2016  . Heavy smoker (more than 20 cigarettes per day) 09/21/2016  . Sun-damaged skin 09/21/2016  . Morbid obesity with BMI of 45.0-49.9, adult ([place]) 09/20/2016  . OSA on CPAP 09/20/2016  . Other emphysema ([place]) 09/20/2016   Past Medical History:  Diagnosis Date  . Arthritis   . COPD (chronic obstructive pulmonary disease) ([place])   . History of kidney stones   . Hypertension    HX OF 1 YEAR AGO NEVER TOOK MEDS FOR  . Obesity   . Pre-diabetes   . Sleep apnea   . Tobacco use     Family History  Problem Relation Age of Onset  . Heart defect Maternal Grandmother   . AAA (abdominal aortic aneurysm) Mother   . AAA (abdominal aortic  aneurysm) Father   . Colon cancer Neg Hx   . Esophageal cancer Neg Hx   . Pancreatic cancer Neg Hx   . Rectal cancer Neg Hx   . Stomach cancer Neg Hx   . Diabetes Neg Hx     Past Surgical History:  Procedure Laterality Date  . APPENDECTOMY    . CHOLECYSTECTOMY    . COLONOSCOPY N/A 01/04/2017   Procedure: COLONOSCOPY;  Surgeon: Doran Stabler, MD;  Location: [facility];  Service: Gastroenterology;  Laterality: N/A;   Social History   Occupational History  . Not on file  Tobacco Use  . Smoking status: Former Smoker    Packs/day: 1.00    Years: 30.00    Pack years: 30.00  . Smokeless tobacco: Never Used  . Tobacco comment: QUIT October 18 2016  Substance and Sexual Activity  . Alcohol use: No    Alcohol/week: 0.0 oz  . Drug use: No  . Sexual activity: Not on file

## 2017-06-03 ENCOUNTER — Other Ambulatory Visit: Payer: Self-pay | Admitting: Endocrinology

## 2017-06-03 LAB — FRUCTOSAMINE: FRUCTOSAMINE: 206 umol/L (ref 190–270)

## 2017-06-03 MED ORDER — GLIPIZIDE ER 2.5 MG PO TB24: 2.5000 mg | ORAL_TABLET | Freq: Every day | ORAL | 5 refills | Status: DC

## 2017-06-07 ENCOUNTER — Telehealth: Payer: Self-pay | Admitting: Family Medicine

## 2017-06-07 ENCOUNTER — Telehealth: Payer: Self-pay | Admitting: Endocrinology

## 2017-06-07 NOTE — Telephone Encounter (Signed)
Pt notified to contact ortho.

## 2017-06-07 NOTE — Telephone Encounter (Signed)
Copied from [place] [id number]. Topic: Inquiry >> Jun 07, 2017 11:35 AM Cecelia Byars, NT wrote: Reason for CRM: Patient is having  neck surgery and he wants an opinion on stem cell therapy please call [phone] to discuss

## 2017-06-07 NOTE — Telephone Encounter (Signed)
Notes recorded by Renato Shin, MD on 06/03/2017 at 3:56 PM EST please call patient: This is like a a1c of approx 5.8%, which is too low Please reduce glipizide to 2.5 mg qd. I have sent a prescription to your pharmacy His surgical risk is low and outweighed by the potential benefit of the surgery. he is therefore medically cleared. Please come back for a follow-up appointment in 3 months. Patient notified of results and voiced understanding.

## 2017-06-07 NOTE — Telephone Encounter (Signed)
I do not know enough about this therapy to give him an educated answer. Ortho would be a better at explaining this to him

## 2017-06-07 NOTE — Telephone Encounter (Signed)
Patient is call for results of lab work

## 2017-06-20 ENCOUNTER — Telehealth: Payer: Self-pay | Admitting: Endocrinology

## 2017-06-20 ENCOUNTER — Telehealth (INDEPENDENT_AMBULATORY_CARE_PROVIDER_SITE_OTHER): Payer: Self-pay | Admitting: Orthopaedic Surgery

## 2017-06-20 DIAGNOSIS — E119 Type 2 diabetes mellitus without complications: Secondary | ICD-10-CM

## 2017-06-20 NOTE — Telephone Encounter (Signed)
I called pt. Last A1C was 12.8 and spine fusion guidelines which are new , Neuro/Ortho spine surgeons have to follow the guidelines [facility] and cutoff is A1C of 7.5. Called Dr. Cordelia Pen office and they will call pt to come back in to see him.  If A1C is significantly better I can submit and they may let us proceed with the surgery.  My cell [phone].

## 2017-06-20 NOTE — Telephone Encounter (Signed)
-----   Message from Dorna Leitz, [place] sent at 06/20/2017 11:02 AM EDT ----- Regarding: Clearance for surgery I received a phone call from Dr. Aline August & he stated that he would not be able to operate on patient because his a1c is over 7.5. He asked that I call & schedule patient a follow-up to have labs rechecked. Would like me to schedule him for follow-up with you or just do labs? Also a note will have to be dictated to Dr.Yeats id patient is cleared for surgery.

## 2017-06-20 NOTE — Telephone Encounter (Signed)
Ok, please come in to have drawn.

## 2017-06-20 NOTE — Telephone Encounter (Signed)
Please advise. Note in system from Dr. Loanne Drilling.

## 2017-06-20 NOTE — Telephone Encounter (Signed)
Please see message from Dr. Lorin Mercy below.

## 2017-06-20 NOTE — Telephone Encounter (Signed)
Patient called stating that he was been cleared for surgery (see note in chart 02-26 from Dr. Renato Shin).  He currently has an appointment w/Dr. Lorin Mercy 07-13-17.  Last instructions on 05-06-17 were for Mr. Jerome Sims to follow up in 3 months with Dr Lorin Mercy after blood sugar is under control.  Patient would like to cancel the April appointment an go straight to scheduling the surgery ASAP because the neck and shoulder are getting worse.  Please advise

## 2017-06-20 NOTE — Telephone Encounter (Signed)
I LVM for patient to call back to make lab appt for a1c recheck.

## 2017-06-21 ENCOUNTER — Other Ambulatory Visit (INDEPENDENT_AMBULATORY_CARE_PROVIDER_SITE_OTHER): Payer: Self-pay

## 2017-06-21 DIAGNOSIS — E119 Type 2 diabetes mellitus without complications: Secondary | ICD-10-CM

## 2017-06-21 LAB — HEMOGLOBIN A1C: Hgb A1c MFr Bld: 7.3 % — ABNORMAL HIGH (ref 4.6–6.5)

## 2017-06-27 NOTE — Pre-Procedure Instructions (Signed)
Jerome Sims  06/27/2017      [facility] - [place], [place] - [place] [address] Phone: [phone] Fax: [phone]    Your procedure is scheduled on March 25.  Report to [facility] Admitting at 1030 A.M.  Call this number if you have problems the morning of surgery:  [phone]   Remember:  Do not eat food or drink liquids after midnight.  Take these medicines the morning of surgery with A SIP OF WATER None  Stop taking aspirin, BC's, Goody's, herbal medications, Fish Oil, Vitamins, Ibuprofen, Advil, Motrin, Aleve    How to Manage Your Diabetes Before and After Surgery  Why is it important to control my blood sugar before and after surgery? . Improving blood sugar levels before and after surgery helps healing and can limit problems. . A way of improving blood sugar control is eating a healthy diet by: o  Eating less sugar and carbohydrates o  Increasing activity/exercise o  Talking with your doctor about reaching your blood sugar goals . High blood sugars (greater than 180 mg/dL) can raise your risk of infections and slow your recovery, so you will need to focus on controlling your diabetes during the weeks before surgery. . Make sure that the doctor who takes care of your diabetes knows about your planned surgery including the date and location.  How do I manage my blood sugar before surgery? . Check your blood sugar at least 4 times a day, starting 2 days before surgery, to make sure that the level is not too high or low. o Check your blood sugar the morning of your surgery when you wake up and every 2 hours until you get to the Short Stay unit. . If your blood sugar is less than 70 mg/dL, you will need to treat for low blood sugar: o Do not take insulin. o Treat a low blood sugar (less than 70 mg/dL) with  cup of clear juice (cranberry or apple), 4 glucose tablets, OR glucose gel. Recheck blood sugar in  15 minutes after treatment (to make sure it is greater than 70 mg/dL). If your blood sugar is not greater than 70 mg/dL on recheck, call [phone] o  for further instructions. . Report your blood sugar to the short stay nurse when you get to Short Stay.  . If you are admitted to the hospital after surgery: o Your blood sugar will be checked by the staff and you will probably be given insulin after surgery (instead of oral diabetes medicines) to make sure you have good blood sugar levels. o The goal for blood sugar control after surgery is 80-180 mg/dL.              WHAT DO I DO ABOUT MY DIABETES MEDICATION?   Marland Kitchen Do not take oral diabetes medicines (pills) the morning of surgery. Glipizide (Glipizide-XL)      . The day of surgery, do not take other diabetes injectables, including Byetta (exenatide), Bydureon (exenatide ER), Victoza (liraglutide), or Trulicity (dulaglutide).  . If your CBG is greater than 220 mg/dL, you may take  of your sliding scale (correction) dose of insulin.  Other Instructions:          Patient Signature:  Date:   Nurse Signature:  Date:   Reviewed and Endorsed by [facility] Patient Education Committee, August 2015  Do not wear jewelry, make-up or nail polish.  Do not  wear lotions, powders, or perfumes, or deodorant.  Do not shave 48 hours prior to surgery.  Men may shave face and neck.  Do not bring valuables to the hospital.  [facility] is not responsible for any belongings or valuables.  Contacts, dentures or bridgework may not be worn into surgery.  Leave your suitcase in the car.  After surgery it may be brought to your room.  For patients admitted to the hospital, discharge time will be determined by your treatment team.  Patients discharged the day of surgery will not be allowed to drive home.    Special instructions:   [facility]- Preparing For Surgery  Before surgery, you can play an important role. Because skin is not  sterile, your skin needs to be as free of germs as possible. You can reduce the number of germs on your skin by washing with CHG (chlorahexidine gluconate) Soap before surgery.  CHG is an antiseptic cleaner which kills germs and bonds with the skin to continue killing germs even after washing.  Please do not use if you have an allergy to CHG or antibacterial soaps. If your skin becomes reddened/irritated stop using the CHG.  Do not shave (including legs and underarms) for at least 48 hours prior to first CHG shower. It is OK to shave your face.  Please follow these instructions carefully.   1. Shower the NIGHT BEFORE SURGERY and the MORNING OF SURGERY with CHG.   2. If you chose to wash your hair, wash your hair first as usual with your normal shampoo.  3. After you shampoo, rinse your hair and body thoroughly to remove the shampoo.  4. Use CHG as you would any other liquid soap. You can apply CHG directly to the skin and wash gently with a scrungie or a clean washcloth.   5. Apply the CHG Soap to your body ONLY FROM THE NECK DOWN.  Do not use on open wounds or open sores. Avoid contact with your eyes, ears, mouth and genitals (private parts). Wash Face and genitals (private parts)  with your normal soap.  6. Wash thoroughly, paying special attention to the area where your surgery will be performed.  7. Thoroughly rinse your body with warm water from the neck down.  8. DO NOT shower/wash with your normal soap after using and rinsing off the CHG Soap.  9. Pat yourself dry with a CLEAN TOWEL.  10. Wear CLEAN PAJAMAS to bed the night before surgery, wear comfortable clothes the morning of surgery  11. Place CLEAN SHEETS on your bed the night of your first shower and DO NOT SLEEP WITH PETS.    Day of Surgery: Do not apply any deodorants/lotions. Please wear clean clothes to the hospital/surgery center.      Please read over the following fact sheets that you were given. Pain Booklet,  Coughing and Deep Breathing, MRSA Information and Surgical Site Infection Prevention, Incentive Spirometry

## 2017-06-28 ENCOUNTER — Encounter (HOSPITAL_COMMUNITY): Payer: Self-pay

## 2017-06-28 ENCOUNTER — Other Ambulatory Visit: Payer: Self-pay

## 2017-06-28 ENCOUNTER — Ambulatory Visit (HOSPITAL_COMMUNITY)
Admission: RE | Admit: 2017-06-28 | Discharge: 2017-06-28 | Disposition: A | Discharge status: Home / Self Care | Payer: Self-pay | Source: Ambulatory Visit | Attending: Surgery | Admitting: Surgery

## 2017-06-28 ENCOUNTER — Encounter (HOSPITAL_COMMUNITY)
Admission: RE | Admit: 2017-06-28 | Discharge: 2017-06-28 | Disposition: A | Discharge status: Home / Self Care | Payer: Self-pay | Source: Ambulatory Visit | Attending: Orthopaedic Surgery | Admitting: Orthopaedic Surgery

## 2017-06-28 DIAGNOSIS — Z01818 Encounter for other preprocedural examination: Secondary | ICD-10-CM

## 2017-06-28 DIAGNOSIS — Z01812 Encounter for preprocedural laboratory examination: Secondary | ICD-10-CM | POA: Insufficient documentation

## 2017-06-28 DIAGNOSIS — Z01810 Encounter for preprocedural cardiovascular examination: Secondary | ICD-10-CM | POA: Insufficient documentation

## 2017-06-28 HISTORY — DX: Type 2 diabetes mellitus without complications: E11.9

## 2017-06-28 LAB — CBC
HCT: 39.3 % (ref 39.0–52.0)
HEMOGLOBIN: 13.2 g/dL (ref 13.0–17.0)
MCH: 30.2 pg (ref 26.0–34.0)
MCHC: 33.6 g/dL (ref 30.0–36.0)
MCV: 89.9 fL (ref 78.0–100.0)
Platelets: 206 10*3/uL (ref 150–400)
RBC: 4.37 MIL/uL (ref 4.22–5.81)
RDW: 13.7 % (ref 11.5–15.5)
WBC: 6.7 10*3/uL (ref 4.0–10.5)

## 2017-06-28 LAB — SURGICAL PCR SCREEN
MRSA, PCR: NEGATIVE
STAPHYLOCOCCUS AUREUS: NEGATIVE

## 2017-06-28 LAB — URINALYSIS, ROUTINE W REFLEX MICROSCOPIC
Bilirubin Urine: NEGATIVE
GLUCOSE, UA: NEGATIVE mg/dL
Hgb urine dipstick: NEGATIVE
KETONES UR: NEGATIVE mg/dL
LEUKOCYTES UA: NEGATIVE
NITRITE: NEGATIVE
PH: 5 (ref 5.0–8.0)
Protein, ur: NEGATIVE mg/dL
Specific Gravity, Urine: 1.023 (ref 1.005–1.030)

## 2017-06-28 LAB — COMPREHENSIVE METABOLIC PANEL
ALBUMIN: 3.3 g/dL — AB (ref 3.5–5.0)
ALK PHOS: 74 U/L (ref 38–126)
ALT: 26 U/L (ref 17–63)
ANION GAP: 7 (ref 5–15)
AST: 24 U/L (ref 15–41)
BUN: 11 mg/dL (ref 6–20)
CO2: 25 mmol/L (ref 22–32)
Calcium: 8.7 mg/dL — ABNORMAL LOW (ref 8.9–10.3)
Chloride: 106 mmol/L (ref 101–111)
Creatinine, Ser: 0.91 mg/dL (ref 0.61–1.24)
GFR calc non Af Amer: >60 mL/min (ref >60)
GLUCOSE: 121 mg/dL — AB (ref 65–99)
Potassium: 4.4 mmol/L (ref 3.5–5.1)
Sodium: 138 mmol/L (ref 135–145)
Total Bilirubin: 0.3 mg/dL (ref 0.3–1.2)
Total Protein: 6.6 g/dL (ref 6.5–8.1)

## 2017-06-28 LAB — HEMOGLOBIN A1C
Hgb A1c MFr Bld: 6.6 % — ABNORMAL HIGH (ref 4.8–5.6)
Mean Plasma Glucose: 142.72 mg/dL

## 2017-06-28 LAB — GLUCOSE, CAPILLARY: GLUCOSE-CAPILLARY: 143 mg/dL — AB (ref 65–99)

## 2017-06-28 NOTE — Progress Notes (Addendum)
PCP is Dorothyann Peng, NP Endocrinology is Dr Renato Shin Denies cardiology Denies chest pain, fever, or cough. States he had a heart cath , stress test, and echo many years ago (1995) was done due to a fall after stopping drinking. Instructed to bring CPAP mask on the day of surgery. Reports fasting CBG's run in the 130's

## 2017-07-01 MED ORDER — DEXTROSE 5 % IV SOLN
3.0000 g | INTRAVENOUS | Status: AC
  Administered 2017-07-04: 3 g via INTRAVENOUS
  Filled 2017-07-01: qty 3

## 2017-07-04 ENCOUNTER — Observation Stay (HOSPITAL_COMMUNITY)
Admission: RE | Admit: 2017-07-04 | Discharge: 2017-07-05 | Disposition: A | Discharge status: Home / Self Care | Payer: Self-pay | Source: Ambulatory Visit | Attending: Orthopaedic Surgery | Admitting: Orthopaedic Surgery

## 2017-07-04 ENCOUNTER — Other Ambulatory Visit: Payer: Self-pay

## 2017-07-04 ENCOUNTER — Inpatient Hospital Stay (HOSPITAL_COMMUNITY): Payer: Self-pay

## 2017-07-04 ENCOUNTER — Inpatient Hospital Stay (HOSPITAL_COMMUNITY): Payer: Self-pay | Admitting: Certified Registered Nurse Anesthetist

## 2017-07-04 ENCOUNTER — Encounter (HOSPITAL_COMMUNITY): Payer: Self-pay | Admitting: *Deleted

## 2017-07-04 ENCOUNTER — Encounter (HOSPITAL_COMMUNITY)
Admission: RE | Disposition: A | Discharge status: Home / Self Care | Payer: Self-pay | Source: Ambulatory Visit | Attending: Orthopaedic Surgery

## 2017-07-04 DIAGNOSIS — J449 Chronic obstructive pulmonary disease, unspecified: Secondary | ICD-10-CM | POA: Insufficient documentation

## 2017-07-04 DIAGNOSIS — Z87891 Personal history of nicotine dependence: Secondary | ICD-10-CM | POA: Insufficient documentation

## 2017-07-04 DIAGNOSIS — M4802 Spinal stenosis, cervical region: Secondary | ICD-10-CM | POA: Diagnosis present

## 2017-07-04 DIAGNOSIS — M47812 Spondylosis without myelopathy or radiculopathy, cervical region: Secondary | ICD-10-CM | POA: Insufficient documentation

## 2017-07-04 DIAGNOSIS — Z79899 Other long term (current) drug therapy: Secondary | ICD-10-CM | POA: Insufficient documentation

## 2017-07-04 DIAGNOSIS — G5602 Carpal tunnel syndrome, left upper limb: Secondary | ICD-10-CM | POA: Insufficient documentation

## 2017-07-04 DIAGNOSIS — M4722 Other spondylosis with radiculopathy, cervical region: Secondary | ICD-10-CM

## 2017-07-04 DIAGNOSIS — G4730 Sleep apnea, unspecified: Secondary | ICD-10-CM | POA: Insufficient documentation

## 2017-07-04 DIAGNOSIS — M50222 Other cervical disc displacement at C5-C6 level: Principal | ICD-10-CM | POA: Insufficient documentation

## 2017-07-04 DIAGNOSIS — E669 Obesity, unspecified: Secondary | ICD-10-CM | POA: Insufficient documentation

## 2017-07-04 DIAGNOSIS — E119 Type 2 diabetes mellitus without complications: Secondary | ICD-10-CM | POA: Insufficient documentation

## 2017-07-04 DIAGNOSIS — Z7984 Long term (current) use of oral hypoglycemic drugs: Secondary | ICD-10-CM | POA: Insufficient documentation

## 2017-07-04 DIAGNOSIS — M1990 Unspecified osteoarthritis, unspecified site: Secondary | ICD-10-CM | POA: Insufficient documentation

## 2017-07-04 DIAGNOSIS — Z419 Encounter for procedure for purposes other than remedying health state, unspecified: Secondary | ICD-10-CM

## 2017-07-04 DIAGNOSIS — Z87442 Personal history of urinary calculi: Secondary | ICD-10-CM | POA: Insufficient documentation

## 2017-07-04 DIAGNOSIS — Z6841 Body Mass Index (BMI) 40.0 and over, adult: Secondary | ICD-10-CM | POA: Insufficient documentation

## 2017-07-04 HISTORY — PX: CARPAL TUNNEL RELEASE: SHX101

## 2017-07-04 HISTORY — PX: ANTERIOR CERVICAL DECOMP/DISCECTOMY FUSION: SHX1161

## 2017-07-04 LAB — GLUCOSE, CAPILLARY
GLUCOSE-CAPILLARY: 112 mg/dL — AB (ref 65–99)
GLUCOSE-CAPILLARY: 203 mg/dL — AB (ref 65–99)
Glucose-Capillary: 106 mg/dL — ABNORMAL HIGH (ref 65–99)

## 2017-07-04 SURGERY — ANTERIOR CERVICAL DECOMPRESSION/DISCECTOMY FUSION 2 LEVELS
Anesthesia: General | Site: Wrist

## 2017-07-04 MED ORDER — BUPIVACAINE-EPINEPHRINE (PF) 0.25% -1:200000 IJ SOLN
INTRAMUSCULAR | Status: AC
Start: 2017-07-04 — End: 2017-07-04
  Filled 2017-07-04: qty 30

## 2017-07-04 MED ORDER — ACETAMINOPHEN 325 MG PO TABS: 650.0000 mg | ORAL_TABLET | ORAL | Status: DC | PRN

## 2017-07-04 MED ORDER — METHOCARBAMOL 500 MG PO TABS: 500.0000 mg | ORAL_TABLET | Freq: Four times a day (QID) | ORAL | 0 refills | Status: DC | PRN

## 2017-07-04 MED ORDER — ACETAMINOPHEN 650 MG RE SUPP: 650.0000 mg | RECTAL | Status: DC | PRN

## 2017-07-04 MED ORDER — CEFAZOLIN SODIUM-DEXTROSE 1-4 GM/50ML-% IV SOLN
1.0000 g | Freq: Three times a day (TID) | INTRAVENOUS | Status: AC
  Administered 2017-07-04 – 2017-07-05 (×2): 1 g via INTRAVENOUS
  Filled 2017-07-04 (×2): qty 50

## 2017-07-04 MED ORDER — PHENOL 1.4 % MT LIQD: 1.0000 | OROMUCOSAL | Status: DC | PRN

## 2017-07-04 MED ORDER — ONDANSETRON HCL 4 MG/2ML IJ SOLN
INTRAMUSCULAR | Status: AC
  Filled 2017-07-04: qty 4

## 2017-07-04 MED ORDER — KETOROLAC TROMETHAMINE 30 MG/ML IJ SOLN
30.0000 mg | Freq: Once | INTRAMUSCULAR | Status: DC | PRN
  Administered 2017-07-04: 30 mg via INTRAVENOUS

## 2017-07-04 MED ORDER — 0.9 % SODIUM CHLORIDE (POUR BTL) OPTIME
TOPICAL | Status: DC | PRN
  Administered 2017-07-04: 1000 mL

## 2017-07-04 MED ORDER — SODIUM CHLORIDE 0.9% FLUSH
3.0000 mL | Freq: Two times a day (BID) | INTRAVENOUS | Status: DC
  Administered 2017-07-04: 3 mL via INTRAVENOUS

## 2017-07-04 MED ORDER — DOCUSATE SODIUM 100 MG PO CAPS
100.0000 mg | ORAL_CAPSULE | Freq: Two times a day (BID) | ORAL | Status: DC
  Administered 2017-07-04: 100 mg via ORAL
  Filled 2017-07-04: qty 1

## 2017-07-04 MED ORDER — FENTANYL CITRATE (PF) 100 MCG/2ML IJ SOLN
INTRAMUSCULAR | Status: DC | PRN
  Administered 2017-07-04 (×3): 100 ug via INTRAVENOUS
  Administered 2017-07-04: 50 ug via INTRAVENOUS

## 2017-07-04 MED ORDER — PHENYLEPHRINE 40 MCG/ML (10ML) SYRINGE FOR IV PUSH (FOR BLOOD PRESSURE SUPPORT)
PREFILLED_SYRINGE | INTRAVENOUS | Status: AC
  Filled 2017-07-04: qty 30

## 2017-07-04 MED ORDER — OXYCODONE HCL 5 MG PO TABS: 5.0000 mg | ORAL_TABLET | ORAL | Status: DC | PRN

## 2017-07-04 MED ORDER — OXYCODONE-ACETAMINOPHEN 7.5-325 MG PO TABS: 1.0000 | ORAL_TABLET | ORAL | 0 refills | Status: DC | PRN

## 2017-07-04 MED ORDER — SUGAMMADEX SODIUM 500 MG/5ML IV SOLN
INTRAVENOUS | Status: AC
  Filled 2017-07-04: qty 5

## 2017-07-04 MED ORDER — BUPIVACAINE-EPINEPHRINE 0.25% -1:200000 IJ SOLN: INTRAMUSCULAR | Status: DC | PRN

## 2017-07-04 MED ORDER — ROCURONIUM BROMIDE 10 MG/ML (PF) SYRINGE
PREFILLED_SYRINGE | INTRAVENOUS | Status: AC
  Filled 2017-07-04: qty 10

## 2017-07-04 MED ORDER — FENTANYL CITRATE (PF) 250 MCG/5ML IJ SOLN
INTRAMUSCULAR | Status: AC
  Filled 2017-07-04: qty 5

## 2017-07-04 MED ORDER — KETOROLAC TROMETHAMINE 30 MG/ML IJ SOLN
INTRAMUSCULAR | Status: AC
  Administered 2017-07-04: 30 mg via INTRAVENOUS
  Filled 2017-07-04: qty 1

## 2017-07-04 MED ORDER — MENTHOL 3 MG MT LOZG: 1.0000 | LOZENGE | OROMUCOSAL | Status: DC | PRN

## 2017-07-04 MED ORDER — CHLORHEXIDINE GLUCONATE 4 % EX LIQD: 60.0000 mL | Freq: Once | CUTANEOUS | Status: DC

## 2017-07-04 MED ORDER — LIDOCAINE HCL (CARDIAC) 20 MG/ML IV SOLN
INTRAVENOUS | Status: AC
  Filled 2017-07-04: qty 10

## 2017-07-04 MED ORDER — HYDROMORPHONE HCL 1 MG/ML IJ SOLN
INTRAMUSCULAR | Status: AC
Start: 2017-07-04 — End: 2017-07-04
  Administered 2017-07-04: 0.5 mg via INTRAVENOUS
  Filled 2017-07-04: qty 1

## 2017-07-04 MED ORDER — SODIUM CHLORIDE 0.9 % IV SOLN: INTRAVENOUS | Status: DC

## 2017-07-04 MED ORDER — POLYETHYLENE GLYCOL 3350 17 G PO PACK
17.0000 g | PACK | Freq: Every day | ORAL | Status: DC
  Administered 2017-07-04: 17 g via ORAL
  Filled 2017-07-04: qty 1

## 2017-07-04 MED ORDER — HYDROMORPHONE HCL 1 MG/ML IJ SOLN
0.2500 mg | INTRAMUSCULAR | Status: DC | PRN
  Administered 2017-07-04 (×4): 0.5 mg via INTRAVENOUS

## 2017-07-04 MED ORDER — MIDAZOLAM HCL 2 MG/2ML IJ SOLN
INTRAMUSCULAR | Status: AC
Start: 2017-07-04 — End: 2017-07-04
  Filled 2017-07-04: qty 2

## 2017-07-04 MED ORDER — PROPOFOL 10 MG/ML IV BOLUS
INTRAVENOUS | Status: DC | PRN
  Administered 2017-07-04: 200 mg via INTRAVENOUS

## 2017-07-04 MED ORDER — SUGAMMADEX SODIUM 200 MG/2ML IV SOLN
INTRAVENOUS | Status: DC | PRN
  Administered 2017-07-04: 500 mg via INTRAVENOUS

## 2017-07-04 MED ORDER — INSULIN ASPART 100 UNIT/ML ~~LOC~~ SOLN
0.0000 [IU] | Freq: Three times a day (TID) | SUBCUTANEOUS | Status: DC
  Administered 2017-07-05: 3 [IU] via SUBCUTANEOUS
  Filled 2017-07-04 (×25): qty 0.15

## 2017-07-04 MED ORDER — DEXAMETHASONE SODIUM PHOSPHATE 4 MG/ML IJ SOLN
INTRAMUSCULAR | Status: DC | PRN
  Administered 2017-07-04: 10 mg via INTRAVENOUS

## 2017-07-04 MED ORDER — ROCURONIUM BROMIDE 100 MG/10ML IV SOLN
INTRAVENOUS | Status: DC | PRN
  Administered 2017-07-04: 70 mg via INTRAVENOUS

## 2017-07-04 MED ORDER — GLIPIZIDE ER 2.5 MG PO TB24
2.5000 mg | ORAL_TABLET | Freq: Every day | ORAL | Status: DC
  Administered 2017-07-05: 2.5 mg via ORAL
  Filled 2017-07-04: qty 1

## 2017-07-04 MED ORDER — DEXAMETHASONE SODIUM PHOSPHATE 10 MG/ML IJ SOLN
INTRAMUSCULAR | Status: AC
  Filled 2017-07-04: qty 2

## 2017-07-04 MED ORDER — ONDANSETRON HCL 4 MG PO TABS: 4.0000 mg | ORAL_TABLET | Freq: Four times a day (QID) | ORAL | Status: DC | PRN

## 2017-07-04 MED ORDER — PROPOFOL 10 MG/ML IV BOLUS
INTRAVENOUS | Status: AC
  Filled 2017-07-04: qty 20

## 2017-07-04 MED ORDER — MIDAZOLAM HCL 5 MG/5ML IJ SOLN
INTRAMUSCULAR | Status: DC | PRN
  Administered 2017-07-04: 2 mg via INTRAVENOUS

## 2017-07-04 MED ORDER — MEPERIDINE HCL 50 MG/ML IJ SOLN: 6.2500 mg | INTRAMUSCULAR | Status: DC | PRN

## 2017-07-04 MED ORDER — HYDROMORPHONE HCL 1 MG/ML IJ SOLN: 1.0000 mg | INTRAMUSCULAR | Status: DC | PRN

## 2017-07-04 MED ORDER — SODIUM CHLORIDE 0.9% FLUSH: 3.0000 mL | INTRAVENOUS | Status: DC | PRN

## 2017-07-04 MED ORDER — HYDROMORPHONE HCL 1 MG/ML IJ SOLN
INTRAMUSCULAR | Status: AC
  Administered 2017-07-04: 0.5 mg via INTRAVENOUS
  Filled 2017-07-04: qty 1

## 2017-07-04 MED ORDER — OXYCODONE-ACETAMINOPHEN 5-325 MG PO TABS
1.0000 | ORAL_TABLET | ORAL | Status: DC | PRN
  Administered 2017-07-04 – 2017-07-05 (×4): 2 via ORAL
  Filled 2017-07-04 (×4): qty 2

## 2017-07-04 MED ORDER — EPHEDRINE 5 MG/ML INJ
INTRAVENOUS | Status: AC
  Filled 2017-07-04: qty 10

## 2017-07-04 MED ORDER — METHOCARBAMOL 1000 MG/10ML IJ SOLN
500.0000 mg | Freq: Four times a day (QID) | INTRAVENOUS | Status: DC | PRN
  Filled 2017-07-04: qty 5

## 2017-07-04 MED ORDER — PROMETHAZINE HCL 25 MG/ML IJ SOLN: 6.2500 mg | INTRAMUSCULAR | Status: DC | PRN

## 2017-07-04 MED ORDER — LACTATED RINGERS IV SOLN
INTRAVENOUS | Status: DC
  Administered 2017-07-04: 11:00:00 via INTRAVENOUS

## 2017-07-04 MED ORDER — ONDANSETRON HCL 4 MG/2ML IJ SOLN: 4.0000 mg | Freq: Four times a day (QID) | INTRAMUSCULAR | Status: DC | PRN

## 2017-07-04 MED ORDER — METHOCARBAMOL 500 MG PO TABS
500.0000 mg | ORAL_TABLET | Freq: Four times a day (QID) | ORAL | Status: DC | PRN
  Administered 2017-07-04 – 2017-07-05 (×2): 500 mg via ORAL
  Filled 2017-07-04 (×2): qty 1

## 2017-07-04 MED ORDER — ONDANSETRON HCL 4 MG/2ML IJ SOLN
INTRAMUSCULAR | Status: DC | PRN
  Administered 2017-07-04: 4 mg via INTRAVENOUS

## 2017-07-04 MED ORDER — LIDOCAINE HCL (CARDIAC) 20 MG/ML IV SOLN
INTRAVENOUS | Status: DC | PRN
  Administered 2017-07-04: 100 mg via INTRAVENOUS

## 2017-07-04 MED ORDER — HEMOSTATIC AGENTS (NO CHARGE) OPTIME
TOPICAL | Status: DC | PRN
  Administered 2017-07-04: 1 via TOPICAL

## 2017-07-04 MED ORDER — PHENYLEPHRINE HCL 10 MG/ML IJ SOLN
INTRAMUSCULAR | Status: DC | PRN
  Administered 2017-07-04: 80 ug via INTRAVENOUS

## 2017-07-04 SURGICAL SUPPLY — 77 items
APL SKNCLS STERI-STRIP NONHPOA (GAUZE/BANDAGES/DRESSINGS) ×4
BANDAGE ACE 4X5 VEL STRL LF (GAUZE/BANDAGES/DRESSINGS) ×2 IMPLANT
BENZOIN TINCTURE PRP APPL 2/3 (GAUZE/BANDAGES/DRESSINGS) ×6 IMPLANT
BIT DRILL SRG 14X2.2XFLT CHK (BIT) IMPLANT
BIT DRL SRG 14X2.2XFLT CHK (BIT) ×2
BLADE CLIPPER SURG (BLADE) IMPLANT
BNDG CMPR 9X4 STRL LF SNTH (GAUZE/BANDAGES/DRESSINGS) ×2
BNDG ESMARK 4X9 LF (GAUZE/BANDAGES/DRESSINGS) ×2 IMPLANT
BNDG GAUZE ELAST 4 BULKY (GAUZE/BANDAGES/DRESSINGS) ×2 IMPLANT
BONE CERV LORDOTIC 14.5X12X6 (Bone Implant) ×4 IMPLANT
BONE CERV LORDOTIC 14.5X12X8 (Bone Implant) ×4 IMPLANT
BUR ROUND FLUTED 4 SOFT TCH (BURR) ×1 IMPLANT
BUR ROUND FLUTED 4MM SOFT TCH (BURR) ×1
CLOSURE WOUND 1/2 X4 (GAUZE/BANDAGES/DRESSINGS) ×1
COLLAR CERV LO CONTOUR FIRM DE (SOFTGOODS) ×2 IMPLANT
CORD BIPOLAR FORCEPS 12FT (ELECTRODE) ×4 IMPLANT
CORDS BIPOLAR (ELECTRODE) ×4 IMPLANT
COVER SURGICAL LIGHT HANDLE (MISCELLANEOUS) ×6 IMPLANT
CRADLE DONUT ADULT HEAD (MISCELLANEOUS) ×4 IMPLANT
CUFF TOURNIQUET SINGLE 24IN (TOURNIQUET CUFF) ×2 IMPLANT
DRAPE C-ARM 42X72 X-RAY (DRAPES) ×4 IMPLANT
DRAPE HALF SHEET 40X57 (DRAPES) ×4 IMPLANT
DRAPE MICROSCOPE LEICA (MISCELLANEOUS) ×4 IMPLANT
DRILL BIT SKYLINE 14MM (BIT) ×4
DURAPREP 26ML APPLICATOR (WOUND CARE) ×6 IMPLANT
DURAPREP 6ML APPLICATOR 50/CS (WOUND CARE) ×4 IMPLANT
ELECT REM PT RETURN 9FT ADLT (ELECTROSURGICAL) ×4
ELECTRODE REM PT RTRN 9FT ADLT (ELECTROSURGICAL) ×2 IMPLANT
EVACUATOR 1/8 PVC DRAIN (DRAIN) ×4 IMPLANT
FORCEPS BIPOLAR SPETZLER 8 1.0 (NEUROSURGERY SUPPLIES) ×2 IMPLANT
GAUZE SPONGE 4X4 12PLY STRL (GAUZE/BANDAGES/DRESSINGS) ×4 IMPLANT
GAUZE XEROFORM 1X8 LF (GAUZE/BANDAGES/DRESSINGS) ×2 IMPLANT
GLOVE BIOGEL PI IND STRL 8 (GLOVE) ×4 IMPLANT
GLOVE BIOGEL PI INDICATOR 8 (GLOVE) ×4
GLOVE ORTHO TXT STRL SZ7.5 (GLOVE) ×8 IMPLANT
GLOVE SURG SS PI 7.0 STRL IVOR (GLOVE) ×2 IMPLANT
GOWN STRL REUS W/ TWL LRG LVL3 (GOWN DISPOSABLE) ×2 IMPLANT
GOWN STRL REUS W/ TWL XL LVL3 (GOWN DISPOSABLE) ×2 IMPLANT
GOWN STRL REUS W/TWL 2XL LVL3 (GOWN DISPOSABLE) ×4 IMPLANT
GOWN STRL REUS W/TWL LRG LVL3 (GOWN DISPOSABLE) ×4
GOWN STRL REUS W/TWL XL LVL3 (GOWN DISPOSABLE) ×4
GRAFT BNE SPCR VG2 14.5X12X6 (Bone Implant) IMPLANT
GRAFT BNE SPCR VG2 14.5X12X8 (Bone Implant) IMPLANT
HEAD HALTER (SOFTGOODS) ×4 IMPLANT
HEMOSTAT SURGICEL 2X14 (HEMOSTASIS) IMPLANT
HOVERMATT SINGLE USE (MISCELLANEOUS) ×2 IMPLANT
KIT BASIN OR (CUSTOM PROCEDURE TRAY) ×4 IMPLANT
KIT ROOM TURNOVER OR (KITS) ×4 IMPLANT
MANIFOLD NEPTUNE II (INSTRUMENTS) ×4 IMPLANT
NDL 25GX 5/8IN NON SAFETY (NEEDLE) ×2 IMPLANT
NEEDLE 25GX 5/8IN NON SAFETY (NEEDLE) ×4 IMPLANT
NS IRRIG 1000ML POUR BTL (IV SOLUTION) ×4 IMPLANT
PACK ORTHO CERVICAL (CUSTOM PROCEDURE TRAY) ×4 IMPLANT
PACK ORTHO EXTREMITY (CUSTOM PROCEDURE TRAY) ×4 IMPLANT
PAD ARMBOARD 7.5X6 YLW CONV (MISCELLANEOUS) ×8 IMPLANT
PAD CAST 4YDX4 CTTN HI CHSV (CAST SUPPLIES) IMPLANT
PADDING CAST COTTON 4X4 STRL (CAST SUPPLIES) ×8
PATTIES SURGICAL .5 X.5 (GAUZE/BANDAGES/DRESSINGS) IMPLANT
PIN TEMP SKYLINE THREADED (PIN) ×2 IMPLANT
PLATE SKYLINE 2 LEVEL 34MM (Plate) ×2 IMPLANT
RESTRAINT LIMB HOLDER UNIV (RESTRAINTS) ×2 IMPLANT
SCREW VAR SELF TAP SKYLINE 14M (Screw) ×12 IMPLANT
STRIP CLOSURE SKIN 1/2X4 (GAUZE/BANDAGES/DRESSINGS) ×3 IMPLANT
SUCTION FRAZIER HANDLE 10FR (MISCELLANEOUS) ×2
SUCTION TUBE FRAZIER 10FR DISP (MISCELLANEOUS) IMPLANT
SURGIFLO W/THROMBIN 8M KIT (HEMOSTASIS) ×2 IMPLANT
SUT BONE WAX W31G (SUTURE) ×4 IMPLANT
SUT ETHILON 4 0 PS 2 18 (SUTURE) ×6 IMPLANT
SUT VIC AB 3-0 X1 27 (SUTURE) ×4 IMPLANT
SUT VICRYL 4-0 PS2 18IN ABS (SUTURE) ×8 IMPLANT
TAPE CLOTH SURG 4X10 WHT LF (GAUZE/BANDAGES/DRESSINGS) ×2 IMPLANT
TOWEL OR 17X24 6PK STRL BLUE (TOWEL DISPOSABLE) ×4 IMPLANT
TOWEL OR 17X26 10 PK STRL BLUE (TOWEL DISPOSABLE) ×4 IMPLANT
TRAY FOLEY CATH SILVER 16FR (SET/KITS/TRAYS/PACK) IMPLANT
TUBE CONNECTING 12'X1/4 (SUCTIONS) ×1
TUBE CONNECTING 12X1/4 (SUCTIONS) ×1 IMPLANT
WATER STERILE IRR 1000ML POUR (IV SOLUTION) ×4 IMPLANT

## 2017-07-04 NOTE — Transfer of Care (Signed)
Immediate Anesthesia Transfer of Care Note  Patient: Jerome Sims  Procedure(s) Performed: C5-6, C6-7, ANTERIOR CERVICAL DECOMPRESSION/DISCECTOMY FUSION,  ALLOGRAFT PLATE (N/A Spine Cervical) LEFT CARPAL TUNNEL RELEASE (Left Wrist)  Patient Location: PACU  Anesthesia Type:General  Level of Consciousness: awake and alert   Airway & Oxygen Therapy: Patient Spontanous Breathing and Patient connected to nasal cannula oxygen  Post-op Assessment: Report given to RN, Post -op Vital signs reviewed and stable and Patient moving all extremities  Post vital signs: Reviewed and stable  Last Vitals:  Vitals Value Taken Time  BP 159/89 07/04/2017  5:02 PM  Temp 36.5 C 07/04/2017  5:01 PM  Pulse 76 07/04/2017  5:07 PM  Resp 14 07/04/2017  5:07 PM  SpO2 100 % 07/04/2017  5:07 PM  Vitals shown include unvalidated device data.  Last Pain:  Vitals:   07/04/17 1701  TempSrc:   PainSc: Asleep         Complications: No apparent anesthesia complications

## 2017-07-04 NOTE — Anesthesia Procedure Notes (Signed)
Procedure Name: Intubation Date/Time: 07/04/2017 1:22 PM Performed by: Broderick Fonseca T, CRNA Pre-anesthesia Checklist: Emergency Drugs available, Patient identified, Suction available and Patient being monitored Patient Re-evaluated:Patient Re-evaluated prior to induction Oxygen Delivery Method: Circle system utilized Preoxygenation: Pre-oxygenation with 100% oxygen Induction Type: Combination inhalational/ intravenous induction Ventilation: Mask ventilation without difficulty and Oral airway inserted - appropriate to patient size Laryngoscope Size: Miller and 3 Grade View: Grade I Tube type: Oral Tube size: 7.5 mm Number of attempts: 1 Airway Equipment and Method: Patient positioned with wedge pillow and Stylet Placement Confirmation: ETT inserted through vocal cords under direct vision,  positive ETCO2 and breath sounds checked- equal and bilateral Secured at: 21 cm Tube secured with: Tape Dental Injury: Teeth and Oropharynx as per pre-operative assessment

## 2017-07-04 NOTE — Op Note (Signed)
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  Left carpal tunnel syndrome.  Postop diagnosis: Same  Procedure: C5-6, C6-7 ( 2 level) anterior cervical discectomy and fusion, allograft and plate.  Left carpal tunnel release.  Surgeon: Rodell Perna MD  Assistant: Benjiman Core PA-C medically necessary and present for the entire cervical procedure.  No assist for carpal tunnel.  Anesthesia: General  Tourniquet: 250 x 4 minutes for left carpal tunnel release.  Drains: One Hemovac neck  Procedure: After induction general anesthesia orotracheal intubation standard prepping and draping with arms tucked at side [place] restraints due to the patient's body habitus short neck and obesity slides were available for the arms but were not needed.  Yellow pads were used for protection of the ulnar nerve head halter traction was applied without weight.  Area squared with towels sterile skin marker for planned incision starting at the midline extending of the left Betadine Steri-Drape sterile male standard at the head and thyroid sheet.  After timeout procedure incision was made in the neck extending to the left.  Blunt dissection after dividing the platysma in line with the skin incision was proceeded down to the palpable large osteophyte at C6-7 also C4-5.  Clot retractors were not deep enough and the millimeter blades were used self-retaining retractor in combination with the smooth plates cephalad caudad longest available in the Cloward set.  Visualization was difficult due to the extreme depth.  Short 25 needle was placed in the C5-6 level confirmed with C arm after it was sterilely draped and traction was pulled on wrist restraints for visualization.  Discectomy was performed at C5-6 marking it as soon as the needle was removed and operative microscope was draped and brought in.  Discectomy was performed at C5-6 Cloward curettes micro dissection with 1 and 2 mm Kerrisons were  used to remove posterior spurs bridging and complete takedown of the posterior longitudinal ligament.  Uncovertebral joints were stripped.  Decompression was performed down to the dura trial sizing was performed and a 6 mm graft was placed at the C5-6 level.  Self-retaining retractor was removed continued dissection caudally and then replacement of the self retaining retractors that had to be repositioned a few times due to the depth and make sure that the esophagus was carefully protected and it was difficult to keep the teeth blades underneath the longus Coley.  Esophagus was carefully protected throughout the case.  At C6-7 endplate spurs were removed anteriorly as well as C5-6.  Dissection down to the posterior longitudinal ligament was performed removing the bulging disc and a 8 mm graft was placed which gave her a secure fit.  Skyline Depuy plate was selected with 14 mm screws x6 after C arm was used to confirm good position of the plate AP and lateral.  Screws were placed final spot pictures were taken and once this was confirmed good position all screws were locked down with a tiny screwdriver.  Hemovac was placed in and out technique the left in line with the skin incision.  Platysma reapproximated after copious irrigation.  Operative field was dry.  Surgi-Flo was placed in the depth before graft was placed in epidural space was dry.  There was room on the sides of the graft for egressive fluid.  Platysma closed with 3-0 Vicryl 4-0 Vicryl subcuticular closure tincture benzoin Steri-Strips postop dressing and tape was applied.  Soft cervical collar was applied.  Arm was taken out of the wrap and placed on an  arm board.  The arm was placed on a arm table secured.  Proximal arm tourniquet was applied prepping with DuraPrep.  Repeat antibiotics were not needed patient received 3 g of Ancef at the beginning before timeout procedure.  Arm was wrapped in Esmarch after prepping and draping of the extremity she  tourniquet inflated sterile skin marker was used.  Incision was made along the ulnar aspect of the median nerve dividing the tight transverse carpal ligament.  There was distal radial takeoff of the thenar motor branch which was visualized.  Hourglass deformity of the nerve tourniquet deflated bipolar cautery was used for hemostasis that was used initially and subcutaneous tissue after skin incision.  Carpal canal was dry copious irrigation closure of the skin with 4-0 nylon and soft dressing was applied.  Benjiman Core PA-C was not medically necessary for assistance in the carpal tunnel release but did assist with entire cervical procedure.

## 2017-07-04 NOTE — Anesthesia Preprocedure Evaluation (Signed)
Anesthesia Evaluation  Patient identified by MRN, date of birth, ID band Patient awake    Reviewed: Allergy & Precautions, NPO status , Patient's Chart, lab work & pertinent test results  Airway Mallampati: III       Dental no notable dental hx. (+) Teeth Intact   Pulmonary sleep apnea and Continuous Positive Airway Pressure Ventilation , former smoker,    Pulmonary exam normal breath sounds clear to auscultation       Cardiovascular hypertension, Normal cardiovascular exam Rhythm:Regular Rate:Normal     Neuro/Psych negative psych ROS   GI/Hepatic negative GI ROS, Neg liver ROS,   Endo/Other  diabetes, Type 2, Oral Hypoglycemic AgentsMorbid obesity  Renal/GU      Musculoskeletal   Abdominal (+) + obese,   Peds  Hematology negative hematology ROS (+)   Anesthesia Other Findings   Reproductive/Obstetrics                             Anesthesia Physical Anesthesia Plan  ASA: III  Anesthesia Plan: General   Post-op Pain Management:    Induction: Intravenous  PONV Risk Score and Plan: 3 and Ondansetron, Dexamethasone and Midazolam  Airway Management Planned: Oral ETT  Additional Equipment:   Intra-op Plan:   Post-operative Plan: Extubation in OR  Informed Consent: I have reviewed the patients History and Physical, chart, labs and discussed the procedure including the risks, benefits and alternatives for the proposed anesthesia with the patient or authorized representative who has indicated his/her understanding and acceptance.   Dental advisory given  Plan Discussed with: CRNA and Surgeon  Anesthesia Plan Comments:         Anesthesia Quick Evaluation

## 2017-07-04 NOTE — Progress Notes (Signed)
Orthopedic Tech Progress Note Patient Details:  Jerome Sims 09-08-1955 [id number]  Ortho Devices Type of Ortho Device: Soft collar Ortho Device/Splint Location: bedside Ortho Device/Splint Interventions: Criss Alvine 07/04/2017, 6:25 PM

## 2017-07-04 NOTE — H&P (Signed)
Jerome Sims is an 62 y.o. male.   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  Progressively worsening symptoms.  Failed conservative treatment.    Past Medical History:  Diagnosis Date  . Arthritis   . COPD (chronic obstructive pulmonary disease) ([place])   . Diabetes mellitus without complication ([place])   . History of kidney stones   . Hypertension    HX OF 1 YEAR AGO NEVER TOOK MEDS FOR  . Obesity   . Sleep apnea   . Tobacco use     Past Surgical History:  Procedure Laterality Date  . APPENDECTOMY    . CARDIAC CATHETERIZATION     years ago   . CARPAL TUNNEL RELEASE Right   . CHOLECYSTECTOMY    . COLONOSCOPY N/A 01/04/2017   Procedure: COLONOSCOPY;  Surgeon: Doran Stabler, MD;  Location: [facility];  Service: Gastroenterology;  Laterality: N/A;  . KNEE SURGERY Right     Family History  Problem Relation Age of Onset  . Heart defect Maternal Grandmother   . AAA (abdominal aortic aneurysm) Mother   . AAA (abdominal aortic aneurysm) Father   . Colon cancer Neg Hx   . Esophageal cancer Neg Hx   . Pancreatic cancer Neg Hx   . Rectal cancer Neg Hx   . Stomach cancer Neg Hx   . Diabetes Neg Hx    Social History:  reports that he has quit smoking. He has a 30.00 pack-year smoking history. He has never used smokeless tobacco. He reports that he does not drink alcohol or use drugs.  Allergies: No Known Allergies  Medications Prior to Admission  Medication Sig Dispense Refill  . glipiZIDE (GLIPIZIDE XL) 2.5 MG 24 hr tablet Take 1 tablet (2.5 mg total) by mouth daily with breakfast. 30 tablet 5  . ibuprofen (ADVIL,MOTRIN) 200 MG tablet Take 400 mg by mouth every 8 (eight) hours as needed (for pain/headaches.).    Marland Kitchen triamcinolone ointment (KENALOG) 0.5 % Apply 1 application topically 2 (two) times daily. (Patient taking  differently: Apply 1 application topically 2 (two) times daily as needed (for eczema.). ) 30 g 6  . glucose blood (ONETOUCH VERIO) test strip USE TO TEST BLOOD GLUCOSE ONCE DAILY 100 each 12  . Lancets (ONETOUCH ULTRASOFT) lancets USE TO TEST BLOOD GLUCOSE ONCE DAILY 100 each 12    Results for orders placed or performed during the hospital encounter of 07/04/17 (from the past 48 hour(s))  Glucose, capillary     Status: Abnormal   Collection Time: 07/04/17 10:37 AM  Result Value Ref Range   Glucose-Capillary 112 (H) 65 - 99 mg/dL   Comment 1 Notify RN    No results found.  Review of Systems  Constitutional: Negative.   HENT: Negative.   Respiratory: Negative.   Cardiovascular: Negative.   Gastrointestinal: Negative.   Musculoskeletal: Positive for neck pain.  Skin: Negative.   Neurological: Positive for tingling.  Psychiatric/Behavioral: Negative.     Blood pressure (!) 154/83, pulse 72, temperature 98 F (36.7 C), temperature source Oral, resp. rate 20, height 5\' 9"  (1.753 m), weight (!) 332 lb 14.4 oz (151 kg), SpO2 98 %. Physical Exam  Constitutional: He is oriented to person, place, and time. No distress.  HENT:  Head: Normocephalic and atraumatic.  Eyes: Pupils are equal, round, and reactive to light. EOM are normal.  Respiratory: No  respiratory distress.  GI: He exhibits no distension.  Musculoskeletal:  Ortho Exam patient has brachial plexus tenderness on the left negative on the right.  He has about 80% rotation lateral tilting.  No atrophy the upper extremities.  Biceps triceps wrist flexion extension is intact.  Decreased sensation left C6 distribution versus right.  C4, C5 C7 and T1 are normal.  Well-healed carpal tunnel scar on the right.  No thenar atrophy on the left.  Negative Phalen's on the left.  Upper extremity reflexes are 1+ and symmetrical.  No lower extremity clonus knee and ankle jerk are intact anterior tib EHL is intact.  He has pain with internal rotation  of his left hip at 20 degrees.  Pain with figure-of-four left hip and extremes of hip range of motion.  Straight leg raising 90 degrees.  Neurological: He is alert and oriented to person, place, and time.  Skin: Skin is warm and dry.  Psychiatric: He has a normal mood and affect.     Assessment/Plan C5-6, C6-7 HNP Left carpal tunnel syndrome  We will proceed with C5-7 ACDF and left carpal tunnel release as scheduled.  Surgical procedure along with possible risks and complications discussed.  All questions answered.    Benjiman Core, PA-C 07/04/2017, 11:39 AM

## 2017-07-04 NOTE — Interval H&P Note (Signed)
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 After consideration of risks, benefits and other options for treatment, the patient has consented to  Procedure(s): C5-6, C6-7, ANTERIOR CERVICAL DECOMPRESSION/DISCECTOMY FUSION,  ALLOGRAFT PLATE (N/A) LEFT CARPAL TUNNEL RELEASE (Left) as a surgical intervention .  The patient's history has been reviewed, patient examined, no change in status, stable for surgery.  I have reviewed the patient's chart and labs.  Questions were answered to the patient's satisfaction.     Marybelle Killings

## 2017-07-04 NOTE — Plan of Care (Signed)

## 2017-07-05 ENCOUNTER — Encounter (HOSPITAL_COMMUNITY): Payer: Self-pay | Admitting: Orthopaedic Surgery

## 2017-07-05 LAB — GLUCOSE, CAPILLARY: GLUCOSE-CAPILLARY: 168 mg/dL — AB (ref 65–99)

## 2017-07-05 MED ORDER — HYDROCODONE-ACETAMINOPHEN 5-325 MG PO TABS: 1.0000 | ORAL_TABLET | ORAL | 0 refills | Status: DC | PRN

## 2017-07-05 NOTE — Discharge Summary (Signed)
Patient ID: NORRIN SHREFFLER MRN: [id number] DOB/AGE: 1956-02-06 62 y.o.  Admit date: 07/04/2017 Discharge date: 07/05/2017  Admission Diagnoses:  Active Problems:   Cervical spinal stenosis   Discharge Diagnoses:  Active Problems:   Cervical spinal stenosis  status post Procedure(s): C5-6, C6-7, ANTERIOR CERVICAL DECOMPRESSION/DISCECTOMY FUSION,  ALLOGRAFT PLATE LEFT CARPAL TUNNEL RELEASE  Past Medical History:  Diagnosis Date  . Arthritis   . COPD (chronic obstructive pulmonary disease) ([place])   . Diabetes mellitus without complication ([place])   . History of kidney stones   . Hypertension    HX OF 1 YEAR AGO NEVER TOOK MEDS FOR  . Obesity   . Sleep apnea   . Tobacco use     Surgeries: Procedure(s): C5-6, C6-7, ANTERIOR CERVICAL DECOMPRESSION/DISCECTOMY FUSION,  ALLOGRAFT PLATE LEFT CARPAL TUNNEL RELEASE on 07/04/2017   Consultants:   Discharged Condition: Improved  Hospital Course: TRITON HEIDRICH is an 62 y.o. male who was admitted 07/04/2017 for operative treatment of cervical HNP/stenosis and left carpal tunnel syndrome. Patient failed conservative treatments (please see the history and physical for the specifics) and had severe unremitting pain that affects sleep, daily activities and work/hobbies. After pre-op clearance, the patient was taken to the operating room on 07/04/2017 and underwent  Procedure(s): C5-6, C6-7, ANTERIOR CERVICAL DECOMPRESSION/DISCECTOMY FUSION,  ALLOGRAFT PLATE LEFT CARPAL TUNNEL RELEASE.    Patient was given perioperative antibiotics:  Anti-infectives (From admission, onward)   Start     Dose/Rate Route Frequency Ordered Stop   07/04/17 2200  ceFAZolin (ANCEF) IVPB 1 g/50 mL premix     1 g 100 mL/hr over 30 Minutes Intravenous Every 8 hours 07/04/17 1813 07/05/17 0556   07/04/17 1130  ceFAZolin (ANCEF) 3 g in dextrose 5 % 50 mL IVPB     3 g 130 mL/hr over 30 Minutes Intravenous To [facility] 07/01/17 1334 07/04/17 1838        Patient was given sequential compression devices and early ambulation to prevent DVT.   Patient benefited maximally from hospital stay and there were no complications. At the time of discharge, the patient was urinating/moving their bowels without difficulty, tolerating a regular diet, pain is controlled with oral pain medications and they have been cleared by PT/OT.   Recent vital signs:  Patient Vitals for the past 24 hrs:  BP Temp Temp src Pulse Resp SpO2  07/05/17 0700 135/66 98.3 F (36.8 C) Oral 61 18 97 %  07/05/17 0400 (!) 143/81 97.8 F (36.6 C) Oral 66 20 97 %  07/04/17 2331 134/71 98.4 F (36.9 C) Oral 75 (!) 22 94 %  07/04/17 2225 - - - 75 20 95 %  07/04/17 2000 132/83 97.9 F (36.6 C) Oral 72 20 95 %  07/04/17 1810 128/87 98.3 F (36.8 C) - 81 18 97 %  07/04/17 1755 - 97.7 F (36.5 C) - - - -  07/04/17 1747 (!) 141/82 - - 73 11 93 %  07/04/17 1732 138/68 - - 79 (!) 23 95 %  07/04/17 1717 (!) 158/89 - - 74 11 98 %  07/04/17 1702 (!) 159/89 - - 74 14 96 %  07/04/17 1701 - 97.7 F (36.5 C) - - - -     Recent laboratory studies: No results for input(s): WBC, HGB, HCT, PLT, NA, K, CL, CO2, BUN, CREATININE, GLUCOSE, INR, CALCIUM in the last 72 hours.  Invalid input(s): PT, 2   Discharge Medications:   Allergies as of 07/05/2017   No  Known Allergies     Medication List    STOP taking these medications   ibuprofen 200 MG tablet Commonly known as:  ADVIL,MOTRIN     TAKE these medications   glipiZIDE 2.5 MG 24 hr tablet Commonly known as:  GLIPIZIDE XL Take 1 tablet (2.5 mg total) by mouth daily with breakfast.   glucose blood test strip Commonly known as:  ONETOUCH VERIO USE TO TEST BLOOD GLUCOSE ONCE DAILY   HYDROcodone-acetaminophen 5-325 MG tablet Commonly known as:  NORCO Take 1-2 tablets by mouth every 4 (four) hours as needed for moderate pain.   methocarbamol 500 MG tablet Commonly known as:  ROBAXIN Take 1 tablet (500 mg total) by mouth every  6 (six) hours as needed for muscle spasms.   onetouch ultrasoft lancets USE TO TEST BLOOD GLUCOSE ONCE DAILY   triamcinolone ointment 0.5 % Commonly known as:  KENALOG Apply 1 application topically 2 (two) times daily. What changed:    when to take this  reasons to take this       Diagnostic Studies: Dg Chest 2 View  Result Date: 06/28/2017 CLINICAL DATA:  Preoperative examination prior cervical fusion. History of COPD, former smoker, diabetes. EXAM: CHEST - 2 VIEW COMPARISON:  Chest x-ray of June 23, 2015 FINDINGS: The lungs are mildly hyperinflated with hemidiaphragm flattening. There is no focal infiltrate. There is no pleural effusion. The heart and pulmonary vascularity are normal. The mediastinum is normal in width. The trachea is midline. The bony thorax exhibits no acute abnormality. IMPRESSION: COPD.  There is no active cardiopulmonary disease. Electronically Signed   By: David  Martinique M.D.   On: 06/28/2017 10:54   Dg Cervical Spine 2-3 Views  Result Date: 07/04/2017 CLINICAL DATA:  C5-6 and C6-7 ACDF EXAM: DG C-ARM 61-120 MIN; CERVICAL SPINE - 2-3 VIEW COMPARISON:  Cervical MRI 03/09/2017 FINDINGS: Lateral intra procedural fluoroscopy shows sequential stages of C5-6 and C6-7 ACDF with ventral plate. As permitted by overlapping soft tissues, there is unremarkable hardware positioning. IMPRESSION: Fluoroscopy for C5-6 and C6-7 ACDF. Electronically Signed   By: Monte Fantasia M.D.   On: 07/04/2017 16:21   Dg C-arm 1-60 Min  Result Date: 07/04/2017 CLINICAL DATA:  C5-6 and C6-7 ACDF EXAM: DG C-ARM 61-120 MIN; CERVICAL SPINE - 2-3 VIEW COMPARISON:  Cervical MRI 03/09/2017 FINDINGS: Lateral intra procedural fluoroscopy shows sequential stages of C5-6 and C6-7 ACDF with ventral plate. As permitted by overlapping soft tissues, there is unremarkable hardware positioning. IMPRESSION: Fluoroscopy for C5-6 and C6-7 ACDF. Electronically Signed   By: Monte Fantasia M.D.   On: 07/04/2017  16:21   Dg C-arm 1-60 Min  Result Date: 07/04/2017 CLINICAL DATA:  C5-6 and C6-7 ACDF EXAM: DG C-ARM 61-120 MIN; CERVICAL SPINE - 2-3 VIEW COMPARISON:  Cervical MRI 03/09/2017 FINDINGS: Lateral intra procedural fluoroscopy shows sequential stages of C5-6 and C6-7 ACDF with ventral plate. As permitted by overlapping soft tissues, there is unremarkable hardware positioning. IMPRESSION: Fluoroscopy for C5-6 and C6-7 ACDF. Electronically Signed   By: Monte Fantasia M.D.   On: 07/04/2017 16:21   Dg C-arm 1-60 Min  Result Date: 07/04/2017 CLINICAL DATA:  C5-6 and C6-7 ACDF EXAM: DG C-ARM 61-120 MIN; CERVICAL SPINE - 2-3 VIEW COMPARISON:  Cervical MRI 03/09/2017 FINDINGS: Lateral intra procedural fluoroscopy shows sequential stages of C5-6 and C6-7 ACDF with ventral plate. As permitted by overlapping soft tissues, there is unremarkable hardware positioning. IMPRESSION: Fluoroscopy for C5-6 and C6-7 ACDF. Electronically Signed   By: Angelica Chessman  Watts M.D.   On: 07/04/2017 16:21      Follow-up Information    Lanae Crumbly, PA-C Follow up in 1 week(s).   Specialties:  Physician Assistant, Orthopedic Surgery Contact information: [place] [address] [phone]           Discharge Plan:  discharge to home  Disposition:     Signed: Benjiman Core  07/05/2017, 3:49 PM

## 2017-07-05 NOTE — Progress Notes (Signed)
Pt doing well. Pt and family given D/C instructions with Rx's, verbal understanding was provided. Pt's incisions are clean and dry with no sign of infection. Pt's IV was removed prior to D/C. Pt D/C'd home per MD order. Pt is stable @ D/C and has no other needs at this time. Holli Humbles, RN

## 2017-07-05 NOTE — Discharge Instructions (Signed)
Shower with extra collar covered in saran wrap. Dry off then reapply dry collar. Wear collar at all times. See dr. Lorin Mercy or Benjiman Core PA-C in one week.

## 2017-07-05 NOTE — Progress Notes (Signed)
   Subjective: 1 Day Post-Op Procedure(s) (LRB): C5-6, C6-7, ANTERIOR CERVICAL DECOMPRESSION/DISCECTOMY FUSION,  ALLOGRAFT PLATE (N/A) LEFT CARPAL TUNNEL RELEASE (Left) Patient reports pain as mild.    Objective: Vital signs in last 24 hours: Temp:  [97.7 F (36.5 C)-98.4 F (36.9 C)] 97.8 F (36.6 C) (03/26 0400) Pulse Rate:  [66-81] 66 (03/26 0400) Resp:  [11-23] 20 (03/26 0400) BP: (128-159)/(68-89) 143/81 (03/26 0400) SpO2:  [93 %-98 %] 97 % (03/26 0400) Weight:  [332 lb 14.4 oz (151 kg)] 332 lb 14.4 oz (151 kg) (03/25 1059)  Intake/Output from previous day: 03/25 0701 - 03/26 0700 In: 903 [I.V.:903] Out: 182 [Urine:2; Drains:80; Blood:100] Intake/Output this shift: No intake/output data recorded.  No results for input(s): HGB in the last 72 hours. No results for input(s): WBC, RBC, HCT, PLT in the last 72 hours. No results for input(s): NA, K, CL, CO2, BUN, CREATININE, GLUCOSE, CALCIUM in the last 72 hours. No results for input(s): LABPT, INR in the last 72 hours.  Neurologically intact Dg Cervical Spine 2-3 Views  Result Date: 07/04/2017 CLINICAL DATA:  C5-6 and C6-7 ACDF EXAM: DG C-ARM 61-120 MIN; CERVICAL SPINE - 2-3 VIEW COMPARISON:  Cervical MRI 03/09/2017 FINDINGS: Lateral intra procedural fluoroscopy shows sequential stages of C5-6 and C6-7 ACDF with ventral plate. As permitted by overlapping soft tissues, there is unremarkable hardware positioning. IMPRESSION: Fluoroscopy for C5-6 and C6-7 ACDF. Electronically Signed   By: Monte Fantasia M.D.   On: 07/04/2017 16:21   Dg C-arm 1-60 Min  Result Date: 07/04/2017 CLINICAL DATA:  C5-6 and C6-7 ACDF EXAM: DG C-ARM 61-120 MIN; CERVICAL SPINE - 2-3 VIEW COMPARISON:  Cervical MRI 03/09/2017 FINDINGS: Lateral intra procedural fluoroscopy shows sequential stages of C5-6 and C6-7 ACDF with ventral plate. As permitted by overlapping soft tissues, there is unremarkable hardware positioning. IMPRESSION: Fluoroscopy for C5-6 and  C6-7 ACDF. Electronically Signed   By: Monte Fantasia M.D.   On: 07/04/2017 16:21   Dg C-arm 1-60 Min  Result Date: 07/04/2017 CLINICAL DATA:  C5-6 and C6-7 ACDF EXAM: DG C-ARM 61-120 MIN; CERVICAL SPINE - 2-3 VIEW COMPARISON:  Cervical MRI 03/09/2017 FINDINGS: Lateral intra procedural fluoroscopy shows sequential stages of C5-6 and C6-7 ACDF with ventral plate. As permitted by overlapping soft tissues, there is unremarkable hardware positioning. IMPRESSION: Fluoroscopy for C5-6 and C6-7 ACDF. Electronically Signed   By: Monte Fantasia M.D.   On: 07/04/2017 16:21   Dg C-arm 1-60 Min  Result Date: 07/04/2017 CLINICAL DATA:  C5-6 and C6-7 ACDF EXAM: DG C-ARM 61-120 MIN; CERVICAL SPINE - 2-3 VIEW COMPARISON:  Cervical MRI 03/09/2017 FINDINGS: Lateral intra procedural fluoroscopy shows sequential stages of C5-6 and C6-7 ACDF with ventral plate. As permitted by overlapping soft tissues, there is unremarkable hardware positioning. IMPRESSION: Fluoroscopy for C5-6 and C6-7 ACDF. Electronically Signed   By: Monte Fantasia M.D.   On: 07/04/2017 16:21    Assessment/Plan: 1 Day Post-Op Procedure(s) (LRB): C5-6, C6-7, ANTERIOR CERVICAL DECOMPRESSION/DISCECTOMY FUSION,  ALLOGRAFT PLATE (N/A) LEFT CARPAL TUNNEL RELEASE (Left) Plan: discharge home. Office one week.   Marybelle Killings 07/05/2017, 7:47 AM

## 2017-07-06 NOTE — Anesthesia Postprocedure Evaluation (Signed)
Anesthesia Post Note  Patient: Jerome Sims  Procedure(s) Performed: C5-6, C6-7, ANTERIOR CERVICAL DECOMPRESSION/DISCECTOMY FUSION,  ALLOGRAFT PLATE (N/A Spine Cervical) LEFT CARPAL TUNNEL RELEASE (Left Wrist)     Patient location during evaluation: PACU Anesthesia Type: General Level of consciousness: awake Pain management: pain level controlled Vital Signs Assessment: post-procedure vital signs reviewed and stable Respiratory status: spontaneous breathing Cardiovascular status: stable Postop Assessment: no apparent nausea or vomiting Anesthetic complications: no    Last Vitals:  Vitals:   07/05/17 0400 07/05/17 0700  BP: (!) 143/81 135/66  Pulse: 66 61  Resp: 20 18  Temp: 36.6 C 36.8 C  SpO2: 97% 97%    Last Pain:  Vitals:   07/05/17 0940  TempSrc:   PainSc: 6    Pain Goal: Patients Stated Pain Goal: 2 (07/05/17 0940)               Kaycee Haycraft JR,JOHN [place]

## 2017-07-07 ENCOUNTER — Encounter (HOSPITAL_COMMUNITY): Payer: Self-pay | Admitting: Orthopaedic Surgery

## 2017-07-07 ENCOUNTER — Telehealth (INDEPENDENT_AMBULATORY_CARE_PROVIDER_SITE_OTHER): Payer: Self-pay

## 2017-07-07 NOTE — Telephone Encounter (Signed)
Patient called triage wanting to let you know that his surgery was on 07/04/17, but when getting in his sisters car today, he hit his head. He stated he thinks he is ok but wanted you to be aware.  He denied any dizziness, confusion or slurred speech. I advised if any change in symptoms to call 9-1-1.  He also was confused about when he was supposed to come in for post op appt. He said he had a paper to come in on 04/01 too see DR Lorin Mercy asst but Jeneen Rinks is not here. His post op appt looks like it is scheduled for 04/05 with Dr Lorin Mercy.   Please call him to clarify.  [phone]

## 2017-07-07 NOTE — OR Nursing (Signed)
LATE ENTRY: Entered missing implants and missing supplies.  Verified with request to purchase paperwork with RN circulator signature.

## 2017-07-07 NOTE — Telephone Encounter (Signed)
I called and spoke with patient. Appt on 4/5 at 12:45 p with Dr. Lorin Mercy. Advised of schedule changes and that is where we probably had him confused. Also advised, per Dr. Lorin Mercy, neck should be fine. Plate and graft in neck that will not go anywhere. Patient to call if he has any further questions or concerns.

## 2017-07-12 ENCOUNTER — Ambulatory Visit (INDEPENDENT_AMBULATORY_CARE_PROVIDER_SITE_OTHER): Payer: Self-pay | Admitting: Orthopaedic Surgery

## 2017-07-13 ENCOUNTER — Inpatient Hospital Stay (INDEPENDENT_AMBULATORY_CARE_PROVIDER_SITE_OTHER): Payer: Self-pay | Admitting: Orthopaedic Surgery

## 2017-07-15 ENCOUNTER — Ambulatory Visit (INDEPENDENT_AMBULATORY_CARE_PROVIDER_SITE_OTHER): Payer: Self-pay

## 2017-07-15 ENCOUNTER — Encounter (INDEPENDENT_AMBULATORY_CARE_PROVIDER_SITE_OTHER): Payer: Self-pay | Admitting: Orthopaedic Surgery

## 2017-07-15 ENCOUNTER — Ambulatory Visit (INDEPENDENT_AMBULATORY_CARE_PROVIDER_SITE_OTHER): Payer: Self-pay | Admitting: Orthopaedic Surgery

## 2017-07-15 VITALS — BP 169/87 | HR 73 | Ht 68.0 in | Wt 317.0 lb

## 2017-07-15 DIAGNOSIS — G5602 Carpal tunnel syndrome, left upper limb: Secondary | ICD-10-CM

## 2017-07-15 DIAGNOSIS — Z981 Arthrodesis status: Secondary | ICD-10-CM

## 2017-07-15 MED ORDER — HYDROCODONE-ACETAMINOPHEN 5-325 MG PO TABS: 1.0000 | ORAL_TABLET | Freq: Four times a day (QID) | ORAL | 0 refills | Status: DC | PRN

## 2017-07-15 MED ORDER — METHOCARBAMOL 500 MG PO TABS: 500.0000 mg | ORAL_TABLET | Freq: Three times a day (TID) | ORAL | 0 refills | Status: DC | PRN

## 2017-07-15 NOTE — Progress Notes (Addendum)
Post-Op Visit Note   Patient: Jerome Sims           Date of Birth: 05/16/1955           MRN: [id number] Visit Date: 07/15/2017 PCP: Dorothyann Peng, NP   Assessment & Plan: Patient returns now 10 days post 2 level cervical fusion.  He had some drainage around the proximal sutures after it got wet and he had a little bit of purulent drainage coming from the sutures and he removed the sutures from his carpal tunnel on his own and this was done on Sunday 1 day ago.  No purulent drainage.  Proximal portion of his incision for 10 mm is separated 3 mm.  We applied a wrist splint that he can use intermittently remove it when he washes his hand due to the proximal portion dehiscence.  He does not need antibiotics and preop PCR was negative.  Chief Complaint:  Chief Complaint  Patient presents with  . Neck - Routine Post Op  . Left Hand - Routine Post Op   Visit Diagnoses:  1. S/P cervical spinal fusion   2. Carpal tunnel syndrome of left wrist     Plan: Patient can apply soap and water to his left carpal tunnel incision.  He is use triple antibiotic cream which is fine he can add some peroxide as needed.  Recheck 5 weeks with lateral flexion-extension cervical spine x-ray.  Follow-Up Instructions: No follow-ups on file.   Orders:  Orders Placed This Encounter  Procedures  . XR Cervical Spine 2 or 3 views   No orders of the defined types were placed in this encounter.   Imaging: Xr Cervical Spine 2 Or 3 Views  Result Date: 07/15/2017 AP lateral cervical spine x-ray and oblique x-ray with swimmer's view demonstrates C5-6 C6-7 2 level cervical fusion with allograft and plate.  Good position of plate and screws. Impression: Satisfactory postop 2 level cervical fusion at C5-6 C6-7.   PMFS History: Patient Active Problem List   Diagnosis Date Noted  . S/P cervical spinal fusion 07/15/2017  . Cervical spinal stenosis 07/04/2017  . Diabetes ([place]) 05/31/2017  . Protrusion of cervical  intervertebral disc 03/24/2017  . Left hip pain 03/24/2017  . Carpal tunnel syndrome of left wrist 12/15/2016  . Kidney stones 09/23/2016  . Heavy smoker (more than 20 cigarettes per day) 09/21/2016  . Sun-damaged skin 09/21/2016  . Morbid obesity with BMI of 45.0-49.9, adult ([place]) 09/20/2016  . OSA on CPAP 09/20/2016  . Other emphysema ([place]) 09/20/2016   Past Medical History:  Diagnosis Date  . Arthritis   . COPD (chronic obstructive pulmonary disease) ([place])   . Diabetes mellitus without complication ([place])   . History of kidney stones   . Hypertension    HX OF 1 YEAR AGO NEVER TOOK MEDS FOR  . Obesity   . Sleep apnea   . Tobacco use     Family History  Problem Relation Age of Onset  . Heart defect Maternal Grandmother   . AAA (abdominal aortic aneurysm) Mother   . AAA (abdominal aortic aneurysm) Father   . Colon cancer Neg Hx   . Esophageal cancer Neg Hx   . Pancreatic cancer Neg Hx   . Rectal cancer Neg Hx   . Stomach cancer Neg Hx   . Diabetes Neg Hx     Past Surgical History:  Procedure Laterality Date  . ANTERIOR CERVICAL DECOMP/DISCECTOMY FUSION N/A 07/04/2017   Procedure: C5-6, C6-7, ANTERIOR  CERVICAL DECOMPRESSION/DISCECTOMY FUSION,  ALLOGRAFT PLATE;  Surgeon: Marybelle Killings, MD;  Location: [facility];  Service: Orthopedics;  Laterality: N/A;  . APPENDECTOMY    . CARDIAC CATHETERIZATION     years ago   . CARPAL TUNNEL RELEASE Right   . CARPAL TUNNEL RELEASE Left 07/04/2017   Procedure: LEFT CARPAL TUNNEL RELEASE;  Surgeon: Marybelle Killings, MD;  Location: [facility];  Service: Orthopedics;  Laterality: Left;  . CHOLECYSTECTOMY    . COLONOSCOPY N/A 01/04/2017   Procedure: COLONOSCOPY;  Surgeon: Doran Stabler, MD;  Location: [facility];  Service: Gastroenterology;  Laterality: N/A;  . KNEE SURGERY Right    Social History   Occupational History  . Not on file  Tobacco Use  . Smoking status: Former Smoker    Packs/day: 1.00    Years: 30.00    Pack years: 30.00  .  Smokeless tobacco: Never Used  . Tobacco comment: QUIT October 18 2016  Substance and Sexual Activity  . Alcohol use: No    Alcohol/week: 0.0 oz  . Drug use: No  . Sexual activity: Not on file

## 2017-07-17 ENCOUNTER — Other Ambulatory Visit (INDEPENDENT_AMBULATORY_CARE_PROVIDER_SITE_OTHER): Payer: Self-pay | Admitting: Surgery

## 2017-07-18 NOTE — Telephone Encounter (Signed)
OK for # 40 tabs. 1 po tid prn pain. thanks

## 2017-07-18 NOTE — Telephone Encounter (Signed)
Sent to pharmacy 

## 2017-07-18 NOTE — Telephone Encounter (Signed)
Ok for refill? 

## 2017-07-21 ENCOUNTER — Encounter (INDEPENDENT_AMBULATORY_CARE_PROVIDER_SITE_OTHER): Payer: Self-pay | Admitting: Adult Health

## 2017-07-21 NOTE — Progress Notes (Signed)
No charge. 

## 2017-08-23 ENCOUNTER — Ambulatory Visit (INDEPENDENT_AMBULATORY_CARE_PROVIDER_SITE_OTHER): Payer: Self-pay

## 2017-08-23 ENCOUNTER — Encounter (INDEPENDENT_AMBULATORY_CARE_PROVIDER_SITE_OTHER): Payer: Self-pay | Admitting: Orthopaedic Surgery

## 2017-08-23 ENCOUNTER — Ambulatory Visit (INDEPENDENT_AMBULATORY_CARE_PROVIDER_SITE_OTHER): Payer: Self-pay | Admitting: Orthopaedic Surgery

## 2017-08-23 VITALS — BP 157/92 | HR 66 | Ht 68.0 in | Wt 317.0 lb

## 2017-08-23 DIAGNOSIS — Z981 Arthrodesis status: Secondary | ICD-10-CM

## 2017-08-23 NOTE — Progress Notes (Signed)
Post-Op Visit Note   Patient: Jerome Sims           Date of Birth: [birth year]           MRN: [id number] Visit Date: 08/23/2017 PCP: Dorothyann Peng, NP   Assessment & Plan: Post 2 level cervical fusion and left carpal tunnel release.  He had been out of work since November 18, 2016.  His neck is well-healed.  He still has some pain and discomfort with range of motion of his left shoulder.  Mildly positive impingement.  Chief Complaint:  Chief Complaint  Patient presents with  . Neck - Routine Post Op   Visit Diagnoses:  1. S/P cervical spinal fusion     Plan: Patient is healed from his cervical fusion.  He has some impingement in his left shoulder but previous MRI scan of his shoulder with contrast showed no rotator cuff tear and no labral tear with mild degenerative changes seen in the shoulder joint and at the acromioclavicular joint.  We discussed possibility of a subacromial injection however he does have diabetes and we discussed that this may should his sugars up some.  He will call if he has persistent problems with the shoulder.  Work slip given for work resumption.  Follow-Up Instructions: No follow-ups on file.   Orders:  Orders Placed This Encounter  Procedures  . XR Cervical Spine 2 or 3 views   No orders of the defined types were placed in this encounter.   Imaging: Xr Cervical Spine 2 Or 3 Views  Result Date: 08/23/2017 AP lateral swimmer's flexion-extension x-rays obtained of the cervical spine and reviewed.  This shows C5-6, C6-7 anterior cervical discectomy and fusion with good position of plate screws and graft.  No motion is noted on flexion extension. Impression: Satisfactory postop C5-6, C6-7 fusion.  His short neck and body habitus makes it difficult for visualization even on swimmer's views but there is does not appear to be any motion.   PMFS History: Patient Active Problem List   Diagnosis Date Noted  . S/P cervical spinal fusion 07/15/2017  .  Cervical spinal stenosis 07/04/2017  . Diabetes ([facility]) 05/31/2017  . Protrusion of cervical intervertebral disc 03/24/2017  . Left hip pain 03/24/2017  . Carpal tunnel syndrome of left wrist 12/15/2016  . Kidney stones 09/23/2016  . Heavy smoker (more than 20 cigarettes per day) 09/21/2016  . Sun-damaged skin 09/21/2016  . Morbid obesity with BMI of 45.0-49.9, adult ([facility]) 09/20/2016  . OSA on CPAP 09/20/2016  . Other emphysema ([facility]) 09/20/2016   Past Medical History:  Diagnosis Date  . Arthritis   . COPD (chronic obstructive pulmonary disease) ([facility])   . Diabetes mellitus without complication ([facility])   . History of kidney stones   . Hypertension    HX OF 1 YEAR AGO NEVER TOOK MEDS FOR  . Obesity   . Sleep apnea   . Tobacco use     Family History  Problem Relation Age of Onset  . Heart defect Maternal Grandmother   . AAA (abdominal aortic aneurysm) Mother   . AAA (abdominal aortic aneurysm) Father   . Colon cancer Neg Hx   . Esophageal cancer Neg Hx   . Pancreatic cancer Neg Hx   . Rectal cancer Neg Hx   . Stomach cancer Neg Hx   . Diabetes Neg Hx     Past Surgical History:  Procedure Laterality Date  . ANTERIOR CERVICAL DECOMP/DISCECTOMY FUSION N/A 07/04/2017   Procedure:  C5-6, C6-7, ANTERIOR CERVICAL DECOMPRESSION/DISCECTOMY FUSION,  ALLOGRAFT PLATE;  Surgeon: Marybelle Killings, MD;  Location: [facility];  Service: Orthopedics;  Laterality: N/A;  . APPENDECTOMY    . CARDIAC CATHETERIZATION     years ago   . CARPAL TUNNEL RELEASE Right   . CARPAL TUNNEL RELEASE Left 07/04/2017   Procedure: LEFT CARPAL TUNNEL RELEASE;  Surgeon: Marybelle Killings, MD;  Location: [facility];  Service: Orthopedics;  Laterality: Left;  . CHOLECYSTECTOMY    . COLONOSCOPY N/A 01/04/2017   Procedure: COLONOSCOPY;  Surgeon: Doran Stabler, MD;  Location: [facility];  Service: Gastroenterology;  Laterality: N/A;  . KNEE SURGERY Right    Social History   Occupational History  . Not on file  Tobacco Use  .  Smoking status: Former Smoker    Packs/day: 1.00    Years: 30.00    Pack years: 30.00  . Smokeless tobacco: Never Used  . Tobacco comment: QUIT October 18 2016  Substance and Sexual Activity  . Alcohol use: No    Alcohol/week: 0.0 oz  . Drug use: No  . Sexual activity: Not on file

## 2018-01-09 ENCOUNTER — Telehealth: Payer: Self-pay | Admitting: Adult Health

## 2018-01-09 NOTE — Telephone Encounter (Signed)
Received a request for records for the patient from [employer], [place] from [facility] request that the records be sent to [employer], [address], [place] [address] or emailed to [email or web address] or faxed to [phone].  Request given to Margarett M. to process.

## 2018-05-26 ENCOUNTER — Encounter (INDEPENDENT_AMBULATORY_CARE_PROVIDER_SITE_OTHER): Payer: Self-pay | Admitting: Ophthalmology

## 2018-07-23 IMAGING — DX DG CERVICAL SPINE COMPLETE 4+V
6 series · 6 of 6 positions shown · non-contrast
Comparison: None.

CLINICAL DATA: Fall 2 months ago with persistent neck pain, initial
encounter

EXAM:
CERVICAL SPINE - COMPLETE 4+ VIEW

[c-spine lat]
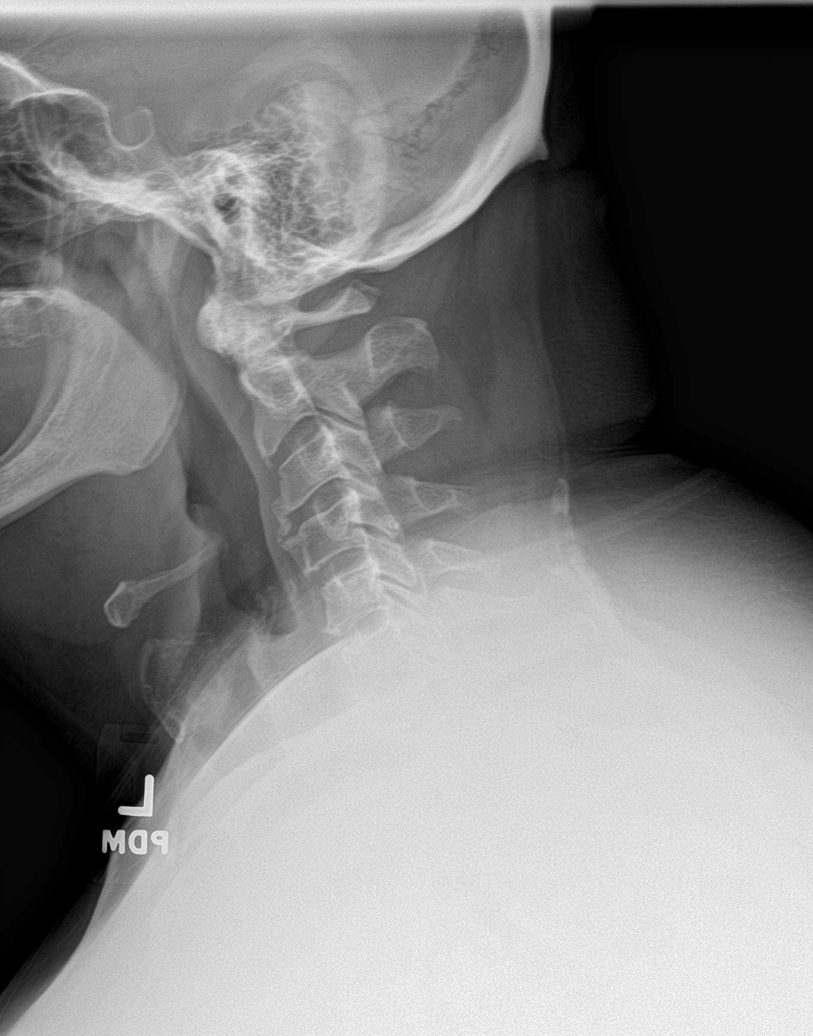

[c-spine obl (1 of 2)]
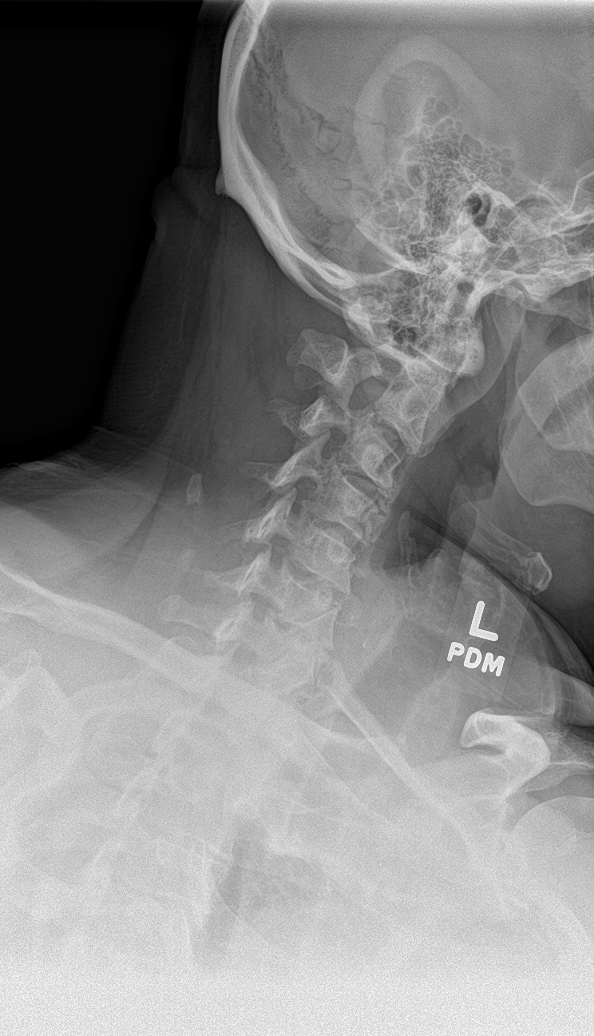

[c-spine obl (2 of 2)]
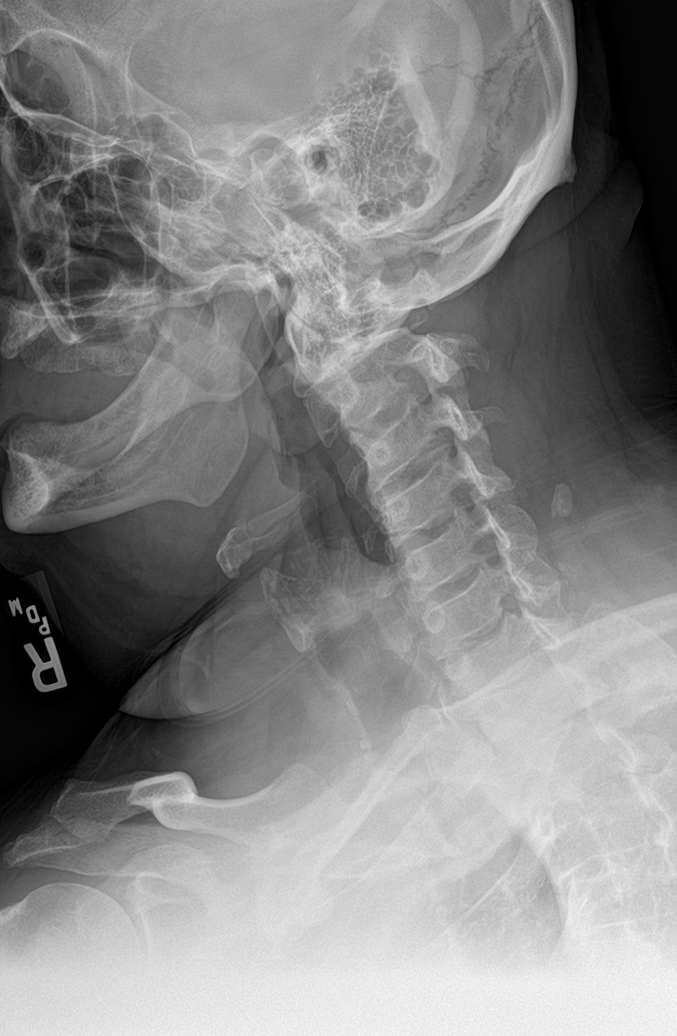

[c-spine ap]
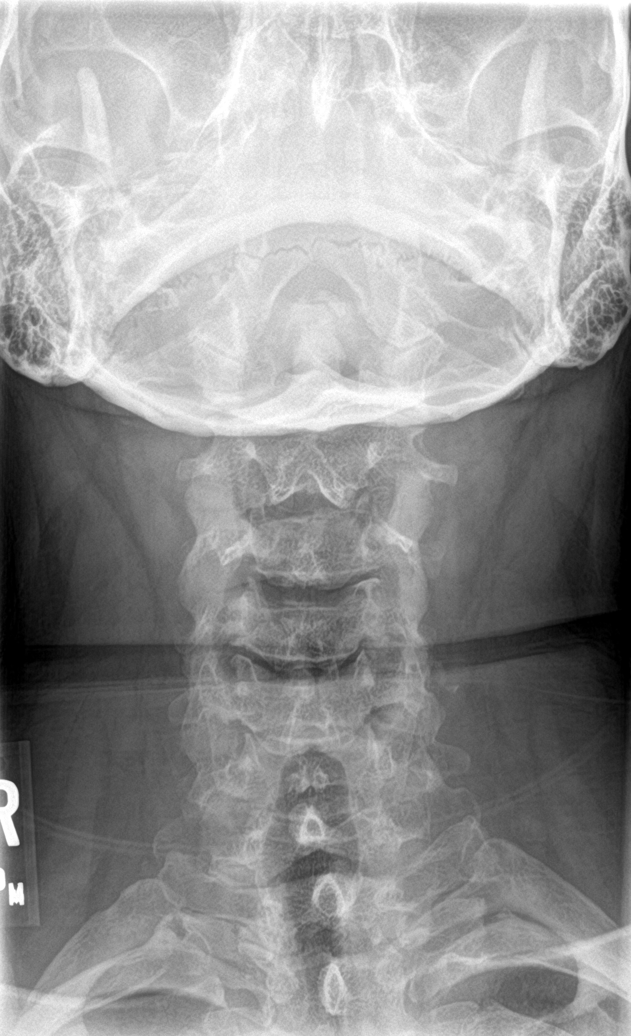

[c-spine open mouth]
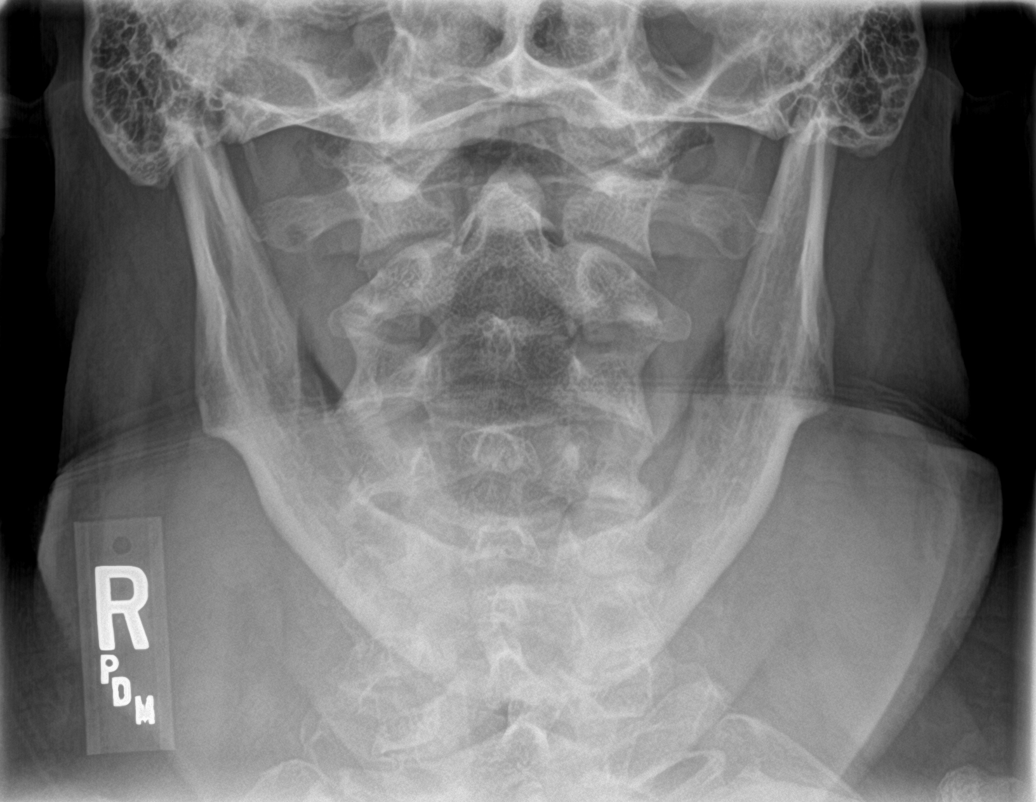

[swimmer]
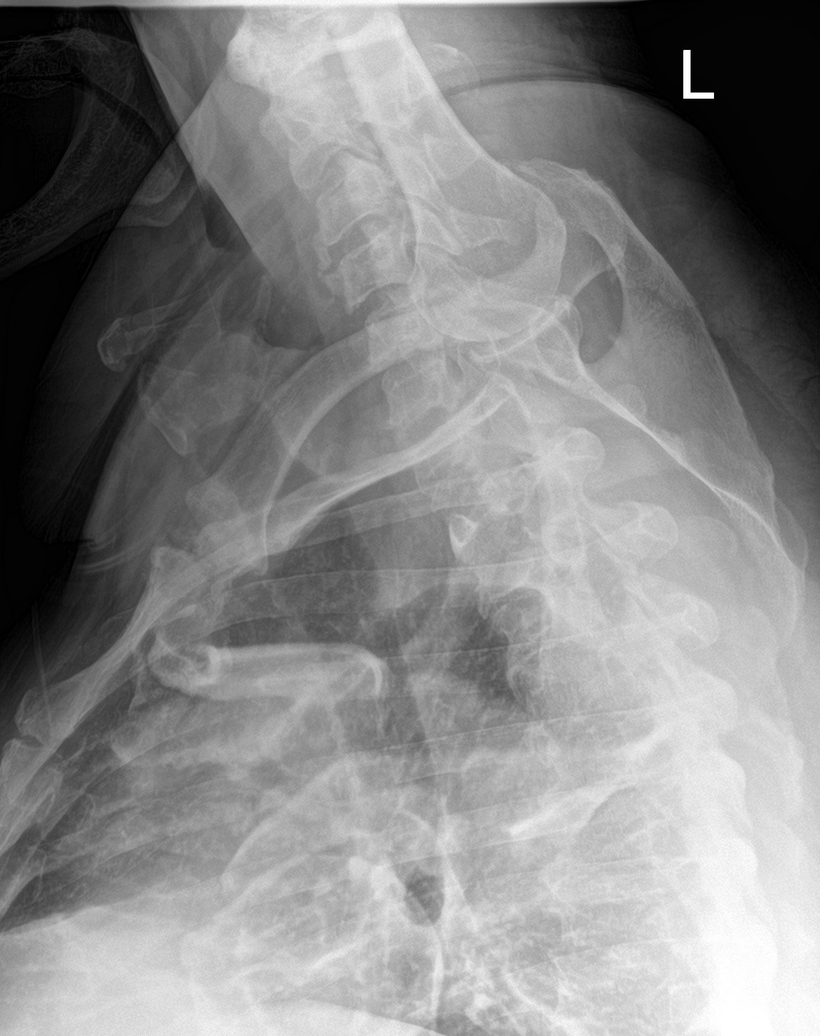

[6 of 6 positions shown; findings below may reference images not displayed]

FINDINGS: Seven cervical segments are visualized. Vertebral body height is
well maintained. No anterolisthesis is noted. Multilevel osteophytic
changes are noted from C3-C7. Mild facet hypertrophic changes are
seen. Mild neural foraminal changes are noted bilaterally
particularly C3-4 and C4-5. No soft tissue abnormality is seen. The
odontoid is within normal limits.
IMPRESSION: Mild degenerative change without acute abnormality.

## 2019-01-14 IMAGING — RF DG C-ARM 61-120 MIN
1 series · 6 of 6 positions shown · non-contrast
Comparison: Cervical MRI 03/09/2017

CLINICAL DATA: C5-6 and C6-7 ACDF

EXAM:
DG C-ARM 61-120 MIN; CERVICAL SPINE - 2-3 VIEW

[Series 1: run · 6 of 6 slices shown]
[im 1/6]
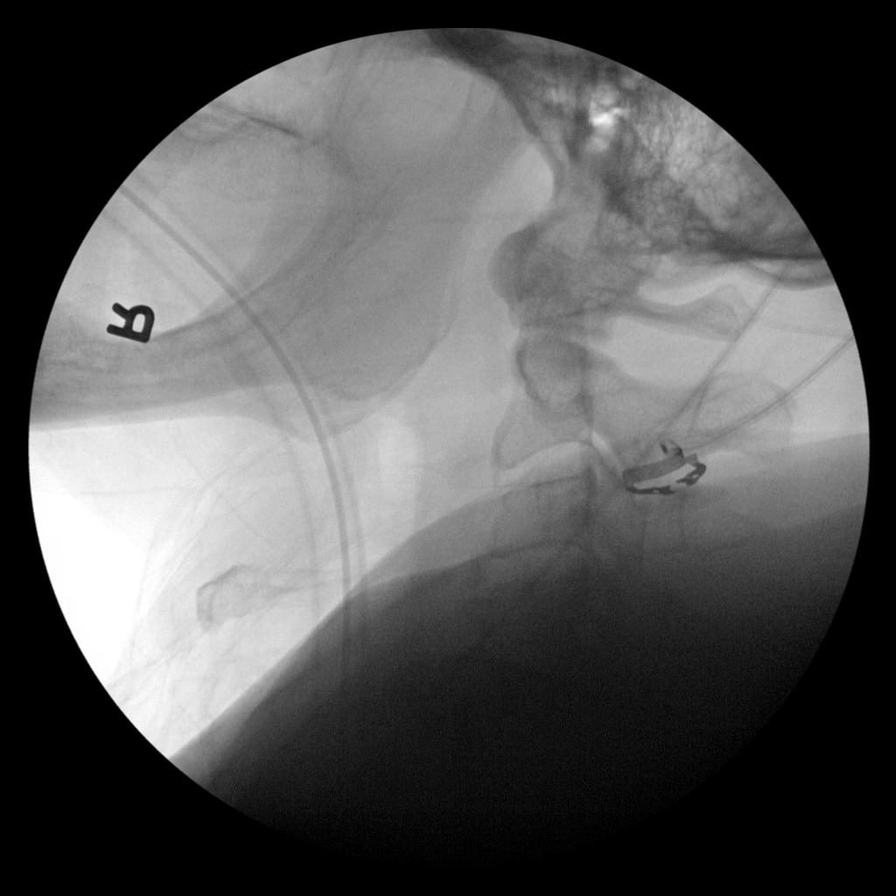
[im 2/6]
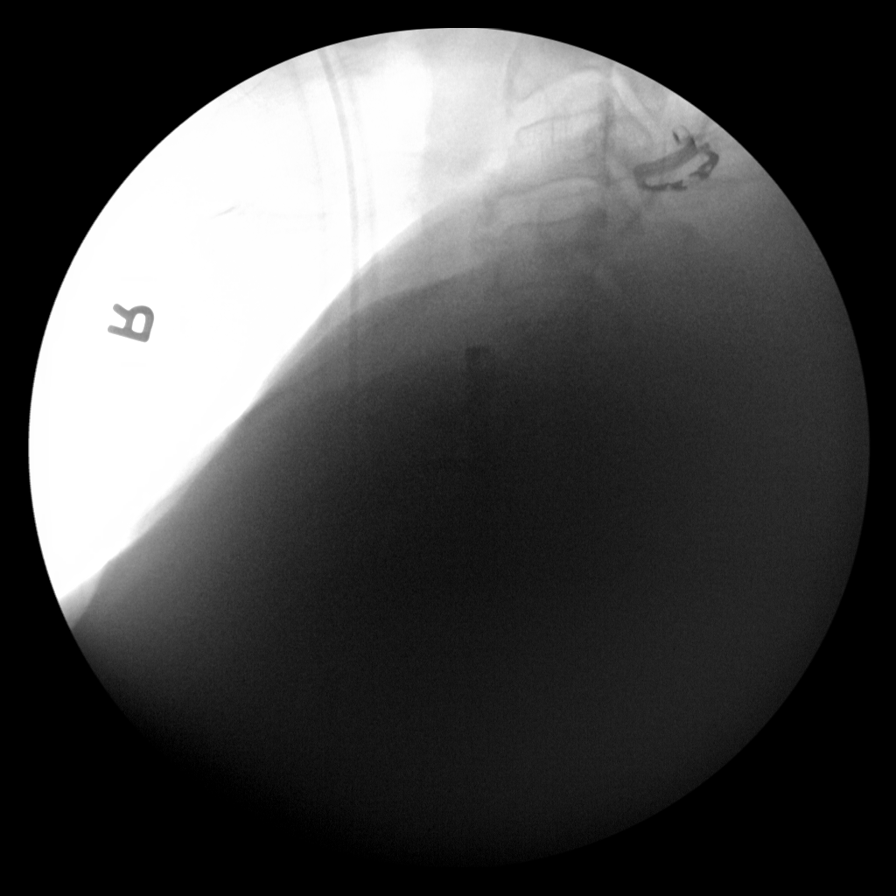
[im 3/6]
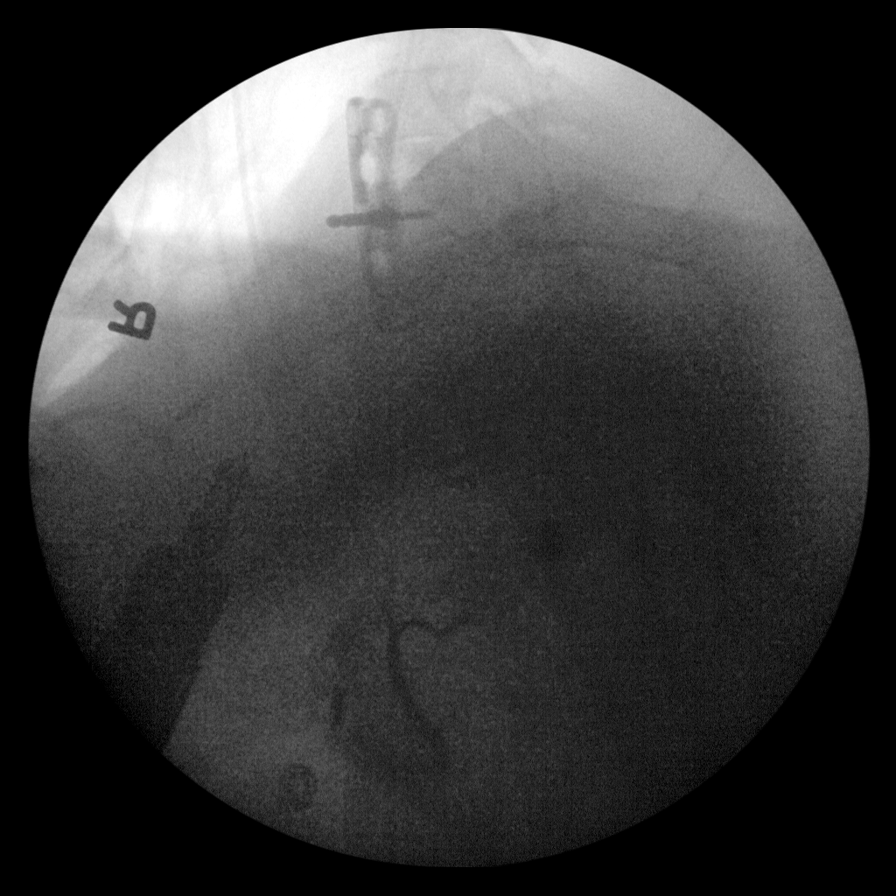
[im 4/6]
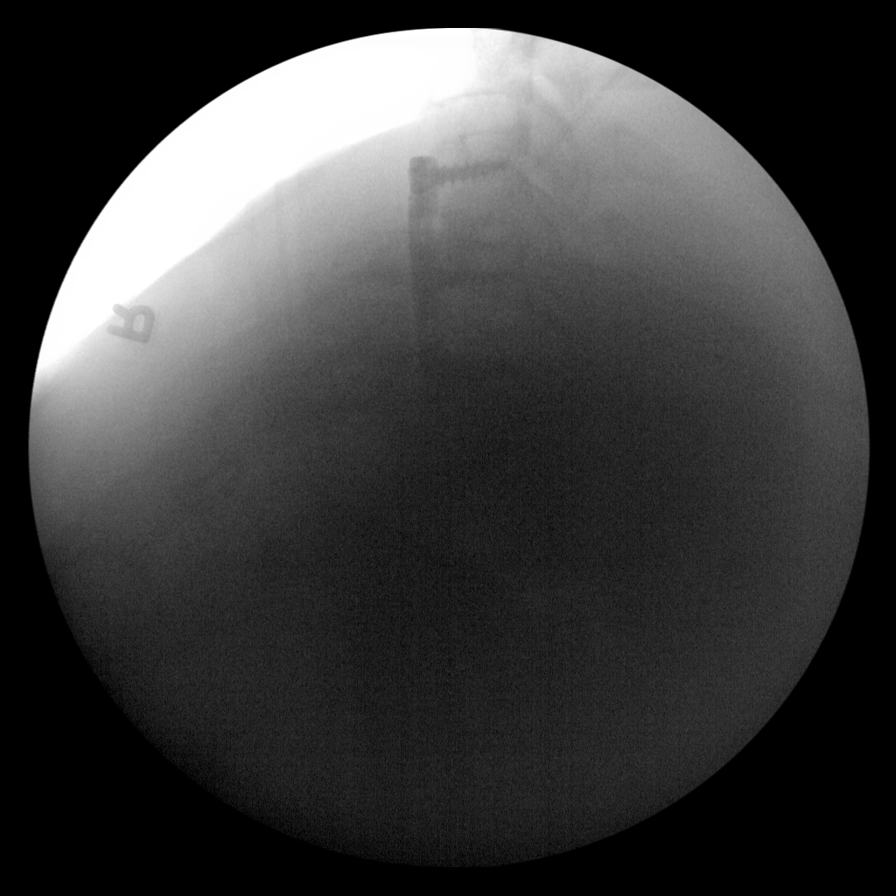
[im 5/6]
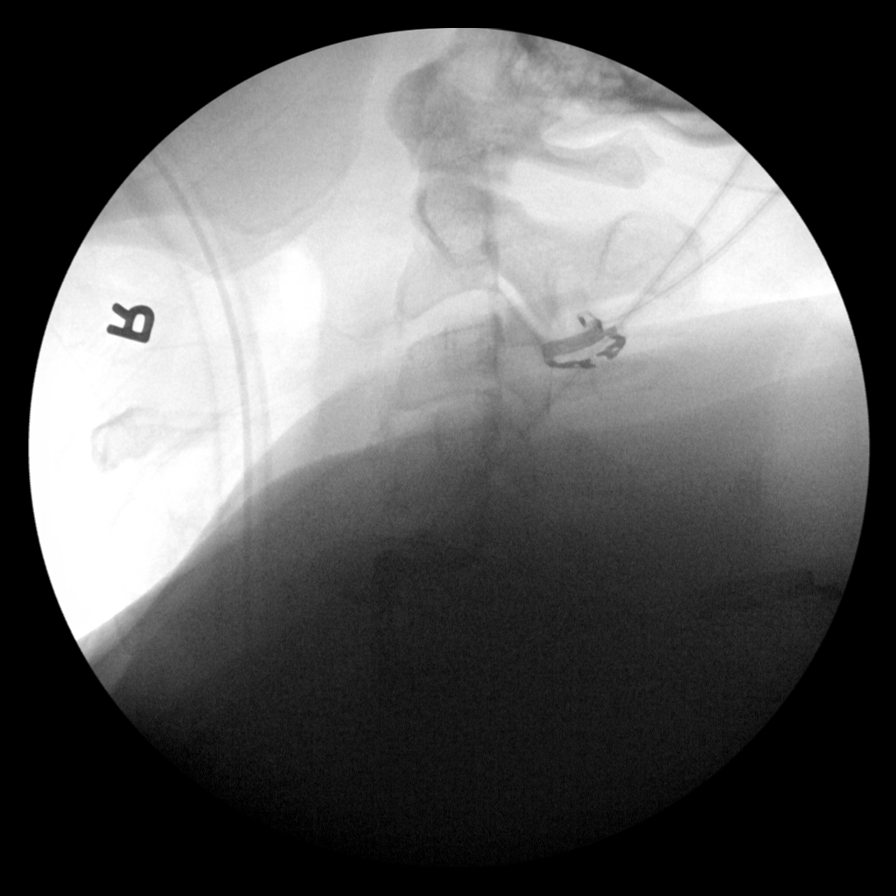
[im 6/6]
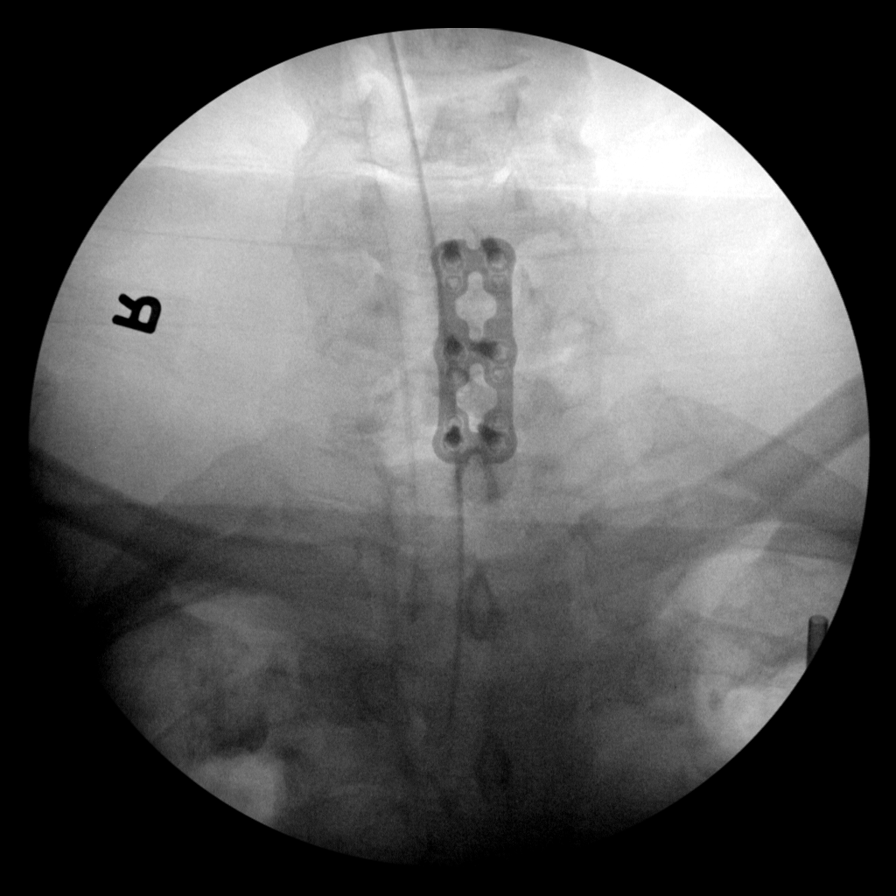

[6 of 6 positions shown; findings below may reference images not displayed]

FINDINGS: Lateral intra procedural fluoroscopy shows sequential stages of C5-6
and C6-7 ACDF with ventral plate. As permitted by overlapping soft
tissues, there is unremarkable hardware positioning.
IMPRESSION: Fluoroscopy for C5-6 and C6-7 ACDF.

## 2020-05-30 ENCOUNTER — Encounter: Payer: Self-pay | Admitting: Gastroenterology

## 2021-04-12 HISTORY — PX: OTHER SURGICAL HISTORY: SHX169

## 2021-10-02 ENCOUNTER — Ambulatory Visit: Payer: Self-pay | Admitting: Orthopaedic Surgery

## 2021-10-20 ENCOUNTER — Encounter: Payer: Self-pay | Admitting: Surgery

## 2021-10-20 ENCOUNTER — Ambulatory Visit: Payer: Medicare Other | Admitting: Surgery

## 2021-10-20 ENCOUNTER — Ambulatory Visit: Payer: Self-pay

## 2021-10-20 ENCOUNTER — Ambulatory Visit: Payer: Medicare Other | Admitting: Orthopaedic Surgery

## 2021-10-20 VITALS — Ht 67.75 in | Wt 303.8 lb

## 2021-10-20 DIAGNOSIS — M1711 Unilateral primary osteoarthritis, right knee: Secondary | ICD-10-CM | POA: Diagnosis not present

## 2021-10-20 DIAGNOSIS — M25561 Pain in right knee: Secondary | ICD-10-CM | POA: Diagnosis not present

## 2021-10-20 DIAGNOSIS — G8929 Other chronic pain: Secondary | ICD-10-CM

## 2021-10-20 NOTE — Progress Notes (Signed)
Office Visit Note   Patient: Jerome Sims           Date of Birth: [birth year]           MRN: [id number] Visit Date: 10/20/2021              Requested by: Dorothyann Peng, NP [address],  [place] [address] PCP: Dorothyann Peng, NP   Assessment & Plan: Visit Diagnoses:  1. Chronic pain of right knee   2. Unilateral primary osteoarthritis, right knee   Failed conservative treatment with multiple injections  Plan: Today I advised patient that since he has failed conservative treatment with multiple injections which include Depo-Medrol and viscosupplementation that best treatment option would be surgical intervention with total knee replacement.  Patient states that he would like to have this done sometime in October or November of this year.  I reviewed right knee x-rays with Dr. Lorin Mercy today.  He agrees with scheduling.  We will need preop medical clearance.  I will see patient back for preop history and physical after we receive the clearance.  Follow-Up Instructions: Return if symptoms worsen or fail to improve.   Orders:  Orders Placed This Encounter  Procedures   XR KNEE 3 VIEW RIGHT   No orders of the defined types were placed in this encounter.     Procedures: No procedures performed   Clinical Data: No additional findings.   Subjective: Chief Complaint  Patient presents with   Neck - Pain    HPI 66 year old white male with known history of end-stage DJD comes in with complaints of worsening chronic right knee pain.  Patient states that he had a knee arthroscopy several years ago in [place].  He has been followed by a physician with [facility] and has had multiple injections.  List includes:  Procedures: 04/18/20: bilateral synvisc 01/11/20: bilateral steroid knee inj 09/21/19: right knee synvisc 08/31/19: left knee synvisc 04/18/20: bilateral knee synvisc 07/18/20: bilateral steroid knee inj 12/10/20:  bilateral steroid knee inj 03/13/21: right knee steroid inj Jan 2023 right knee synvisc  States that his physician told him that he can no longer receive steroid injections.  He is actually scheduled to have repeat right knee Synvisc injection this Friday.  States that the Synvisc is only been helping for 3 weeks.  Has pain with ambulation, squatting, stairs.  This is having a significant negative impact on his quality of life.  He is already been told that he will need definitive treatment with right total knee replacement and is hoping to be able to schedule this for October or November of this year.  Review of Systems No current cardiopulmonary GI/GU issues  Objective: Vital Signs: Ht 5' 7.75" (1.721 m)   Wt (!) 303 lb 12.8 oz (137.8 kg)   BMI 46.53 kg/m   Physical Exam Constitutional:      Appearance: He is obese.  HENT:     Head: Normocephalic and atraumatic.     Nose: Nose normal.  Pulmonary:     Effort: No respiratory distress.  Musculoskeletal:     Comments: Gait is somewhat antalgic.  Right knee good range of motion.  Positive patellofemoral crepitus.  Some swelling without palpable effusion.  Medial lateral joint line tenderness.  Ligaments are stable.  Calf nontender.  Neurovascular intact.  Neurological:     Mental Status: He is alert and oriented to person, place, and time.     Ortho Exam  Specialty  Comments:  No specialty comments available.  Imaging: No results found.   PMFS History: Patient Active Problem List   Diagnosis Date Noted   S/P cervical spinal fusion 07/15/2017   Diabetes ([facility]) 05/31/2017   Left hip pain 03/24/2017   Carpal tunnel syndrome of left wrist 12/15/2016   Kidney stones 09/23/2016   Heavy smoker (more than 20 cigarettes per day) 09/21/2016   Sun-damaged skin 09/21/2016   Morbid obesity with BMI of 45.0-49.9, adult ([facility]) 09/20/2016   OSA on CPAP 09/20/2016   Other emphysema ([facility]) 09/20/2016   Past Medical History:  Diagnosis  Date   Arthritis    COPD (chronic obstructive pulmonary disease) ([facility])    Diabetes mellitus without complication ([facility])    History of kidney stones    Hypertension    HX OF 1 YEAR AGO NEVER TOOK MEDS FOR   Obesity    Sleep apnea    Tobacco use     Family History  Problem Relation Age of Onset   Heart defect Maternal Grandmother    AAA (abdominal aortic aneurysm) Mother    AAA (abdominal aortic aneurysm) Father    Colon cancer Neg Hx    Esophageal cancer Neg Hx    Pancreatic cancer Neg Hx    Rectal cancer Neg Hx    Stomach cancer Neg Hx    Diabetes Neg Hx     Past Surgical History:  Procedure Laterality Date   ANTERIOR CERVICAL DECOMP/DISCECTOMY FUSION N/A 07/04/2017   Procedure: C5-6, C6-7, ANTERIOR CERVICAL DECOMPRESSION/DISCECTOMY FUSION,  ALLOGRAFT PLATE;  Surgeon: Marybelle Killings, MD;  Location: [facility];  Service: Orthopedics;  Laterality: N/A;   APPENDECTOMY     CARDIAC CATHETERIZATION     years ago    CARPAL TUNNEL RELEASE Right    CARPAL TUNNEL RELEASE Left 07/04/2017   Procedure: LEFT CARPAL TUNNEL RELEASE;  Surgeon: Marybelle Killings, MD;  Location: [facility];  Service: Orthopedics;  Laterality: Left;   CHOLECYSTECTOMY     COLONOSCOPY N/A 01/04/2017   Procedure: COLONOSCOPY;  Surgeon: Doran Stabler, MD;  Location: [facility];  Service: Gastroenterology;  Laterality: N/A;   KNEE SURGERY Right    Social History   Occupational History   Not on file  Tobacco Use   Smoking status: Former    Packs/day: 1.00    Years: 30.00    Total pack years: 30.00    Types: Cigarettes   Smokeless tobacco: Never   Tobacco comments:    QUIT October 18 2016  Vaping Use   Vaping Use: Never used  Substance and Sexual Activity   Alcohol use: No    Alcohol/week: 0.0 standard drinks of alcohol   Drug use: No   Sexual activity: Not on file

## 2021-12-31 ENCOUNTER — Telehealth: Payer: Self-pay

## 2021-12-31 NOTE — Telephone Encounter (Signed)
LVM that patient needs to be reestablish with a provider since last OV was 2019.  Also sent mychart message.

## 2022-02-10 ENCOUNTER — Encounter: Payer: Self-pay | Admitting: Orthopaedic Surgery

## 2022-02-10 ENCOUNTER — Ambulatory Visit (INDEPENDENT_AMBULATORY_CARE_PROVIDER_SITE_OTHER): Payer: Medicare Other | Admitting: Orthopaedic Surgery

## 2022-02-10 VITALS — BP 154/85 | HR 75 | Ht 67.8 in | Wt 308.0 lb

## 2022-02-10 DIAGNOSIS — M1711 Unilateral primary osteoarthritis, right knee: Secondary | ICD-10-CM

## 2022-02-10 NOTE — Progress Notes (Signed)
Office Visit Note   Patient: Jerome Sims           Date of Birth: 01-Dec-1955           MRN: [id number] Visit Date: 02/10/2022              Requested by: Dorothyann Peng, NP [address],  [place] [address] PCP: Dorothyann Peng, NP   Assessment & Plan: Visit Diagnoses:  1. Unilateral primary osteoarthritis, right knee     Plan: Patient with right knee osteoarthritis failed injections, anti-inflammatories, both cortisone and Visco injections, Tylenol, use of a cane and knee sleeve.  Plan right total knee arthroplasty we discussed spinal anesthesia, abductor block, Exparel Marcaine, likely overnight stay.  Has a family available once he is discharged and we discussed home physical therapy followed by outpatient therapy.  Questions elicited and answered he understands request to proceed.  Follow-Up Instructions: No follow-ups on file.   Orders:  No orders of the defined types were placed in this encounter.  No orders of the defined types were placed in this encounter.     Procedures: No procedures performed   Clinical Data: No additional findings.   Subjective: Chief Complaint  Patient presents with   Right Knee - Pain    HPI 66 year old male here to discuss total knee arthroplasty.  Patient states he has pain all the time he has been taking anti-inflammatories meloxicam plus Tylenol without relief.  He been seen by another orthopedist in [place] but they were not contracted with his insurance and he was told that total knee arthroplasty was recommendation.  Patient does have some problems with sleep apnea and it is previous cervical fusion which is doing well.  Patient has diabetes last A1c was 7.1.  Review of Systems negative for stroke or cardiac problems.  Positive sleep apnea and history of smoking in the past.  Possible right knee primary osteoarthritis.   Objective: Vital Signs: BP (!) 154/85   Pulse 75   Ht 5' 7.8" (1.722 m)   Wt (!) 308 lb (139.7  kg)   BMI 47.11 kg/m   Physical Exam Constitutional:      Appearance: He is well-developed.  HENT:     Head: Normocephalic and atraumatic.     Right Ear: External ear normal.     Left Ear: External ear normal.  Eyes:     Pupils: Pupils are equal, round, and reactive to light.  Neck:     Thyroid: No thyromegaly.     Trachea: No tracheal deviation.  Cardiovascular:     Rate and Rhythm: Normal rate.  Pulmonary:     Effort: Pulmonary effort is normal.     Breath sounds: No wheezing.  Abdominal:     General: Bowel sounds are normal.     Palpations: Abdomen is soft.  Musculoskeletal:     Cervical back: Neck supple.  Skin:    General: Skin is warm and dry.     Capillary Refill: Capillary refill takes less than 2 seconds.  Neurological:     Mental Status: He is alert and oriented to person, place, and time.  Psychiatric:        Behavior: Behavior normal.        Thought Content: Thought content normal.        Judgment: Judgment normal.     Ortho Exam right knee crepitus.  Negative logroll hips.  Knee and ankle jerk are intact.  Crepitus with knee range of motion.  Medial lateral joint line tenderness.  Specialty Comments:  No specialty comments available.  Imaging: Knee radiograph 10/20/2021.  Standing AP both knees lateral right knee sunrise patella x-ray demonstrates right knee lateral bone-on-bone compartment changes.  Patellofemoral degenerative changes and 50% narrowing of medial joint space.    PMFS History: Patient Active Problem List   Diagnosis Date Noted   Unilateral primary osteoarthritis, right knee 02/14/2022   S/P cervical spinal fusion 07/15/2017   Diabetes ([facility]) 05/31/2017   Left hip pain 03/24/2017   Carpal tunnel syndrome of left wrist 12/15/2016   Kidney stones 09/23/2016   Heavy smoker (more than 20 cigarettes per day) 09/21/2016   Sun-damaged skin 09/21/2016   Morbid obesity with BMI of 45.0-49.9, adult ([facility]) 09/20/2016   OSA on CPAP 09/20/2016    Other emphysema ([facility]) 09/20/2016   Past Medical History:  Diagnosis Date   Arthritis    COPD (chronic obstructive pulmonary disease) ([facility])    Diabetes mellitus without complication ([facility])    History of kidney stones    Hypertension    HX OF 1 YEAR AGO NEVER TOOK MEDS FOR   Obesity    Sleep apnea    Tobacco use     Family History  Problem Relation Age of Onset   Heart defect Maternal Grandmother    AAA (abdominal aortic aneurysm) Mother    AAA (abdominal aortic aneurysm) Father    Colon cancer Neg Hx    Esophageal cancer Neg Hx    Pancreatic cancer Neg Hx    Rectal cancer Neg Hx    Stomach cancer Neg Hx    Diabetes Neg Hx     Past Surgical History:  Procedure Laterality Date   ANTERIOR CERVICAL DECOMP/DISCECTOMY FUSION N/A 07/04/2017   Procedure: C5-6, C6-7, ANTERIOR CERVICAL DECOMPRESSION/DISCECTOMY FUSION,  ALLOGRAFT PLATE;  Surgeon: Marybelle Killings, MD;  Location: [facility];  Service: Orthopedics;  Laterality: N/A;   APPENDECTOMY     CARDIAC CATHETERIZATION     years ago    CARPAL TUNNEL RELEASE Right    CARPAL TUNNEL RELEASE Left 07/04/2017   Procedure: LEFT CARPAL TUNNEL RELEASE;  Surgeon: Marybelle Killings, MD;  Location: [facility];  Service: Orthopedics;  Laterality: Left;   CHOLECYSTECTOMY     COLONOSCOPY N/A 01/04/2017   Procedure: COLONOSCOPY;  Surgeon: Doran Stabler, MD;  Location: [facility];  Service: Gastroenterology;  Laterality: N/A;   KNEE SURGERY Right    Social History   Occupational History   Not on file  Tobacco Use   Smoking status: Former    Packs/day: 1.00    Years: 30.00    Total pack years: 30.00    Types: Cigarettes   Smokeless tobacco: Never   Tobacco comments:    QUIT October 18 2016  Vaping Use   Vaping Use: Never used  Substance and Sexual Activity   Alcohol use: No    Alcohol/week: 0.0 standard drinks of alcohol   Drug use: No   Sexual activity: Not on file

## 2022-02-14 DIAGNOSIS — M1711 Unilateral primary osteoarthritis, right knee: Secondary | ICD-10-CM | POA: Insufficient documentation

## 2022-02-15 ENCOUNTER — Ambulatory Visit: Payer: Medicare Other | Attending: Cardiology | Admitting: Cardiology

## 2022-02-15 ENCOUNTER — Encounter: Payer: Self-pay | Admitting: Cardiology

## 2022-02-15 VITALS — BP 128/84 | HR 68 | Ht 68.0 in | Wt 312.2 lb

## 2022-02-15 DIAGNOSIS — E118 Type 2 diabetes mellitus with unspecified complications: Secondary | ICD-10-CM

## 2022-02-15 DIAGNOSIS — J438 Other emphysema: Secondary | ICD-10-CM | POA: Diagnosis not present

## 2022-02-15 DIAGNOSIS — Z01810 Encounter for preprocedural cardiovascular examination: Secondary | ICD-10-CM | POA: Diagnosis not present

## 2022-02-15 DIAGNOSIS — G4733 Obstructive sleep apnea (adult) (pediatric): Secondary | ICD-10-CM

## 2022-02-15 DIAGNOSIS — E785 Hyperlipidemia, unspecified: Secondary | ICD-10-CM

## 2022-02-15 DIAGNOSIS — I10 Essential (primary) hypertension: Secondary | ICD-10-CM

## 2022-02-15 DIAGNOSIS — E119 Type 2 diabetes mellitus without complications: Secondary | ICD-10-CM

## 2022-02-15 DIAGNOSIS — Z01818 Encounter for other preprocedural examination: Secondary | ICD-10-CM

## 2022-02-15 DIAGNOSIS — E1169 Type 2 diabetes mellitus with other specified complication: Secondary | ICD-10-CM

## 2022-02-15 DIAGNOSIS — Z6841 Body Mass Index (BMI) 40.0 and over, adult: Secondary | ICD-10-CM

## 2022-02-15 NOTE — Progress Notes (Signed)
Primary Care Provider: Dorothyann Peng, NP [place] Cardiologist: Glenetta Hew, MD Electrophysiologist: None  Clinic Note: Chief Complaint  Patient presents with   Establish Care   Pre-op Exam    Total knee surgery   ===================================  ASSESSMENT/PLAN   Problem List Items Addressed This Visit       Cardiology Problems   Essential hypertension (Chronic)    BP pretty well controlled on pretty much modest dose of ACE inhibitor.  Appropriate for someone who has diabetes      Relevant Medications   lisinopril (ZESTRIL) 2.5 MG tablet   Hyperlipidemia associated with type 2 diabetes mellitus ([place]) (Chronic)    On any medications at present.  Warrants evaluation for baseline cardiovascular risk, but this should not keep him from having his knee surgery.  We can address this once his knee surgery is complete      Relevant Medications   lisinopril (ZESTRIL) 2.5 MG tablet     Other   Morbid obesity with BMI of 45.0-49.9, adult (HCC)    Clearly playing a role in dyspnea but also his back and knee pain.  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  He is trying to adjust diet now with diabetes and hypertension.  Hard to lose weight and is not able to walk.      Preoperative cardiovascular examination - Primary    He has pending surgery which is a low risk surgery from a cardiac standpoint. No known coronary disease and no active cardiac symptoms.  No chest pain or pressure with rest exertion.  No CHF symptoms of PND, orthopnea. He does have diabetes, not on insulin.  Normal renal function.  No history of stroke.  Based on the revised cardiac risk index, he is a class I risk patient-relatively low risk patient for a relatively low risk surgery.  Would recommend proceeding with surgery without cardiac evaluation at this point.  We can reassess his cardiovascular risk later on Monday to assess his exercise tolerance without knee pain.  Cardiology  will be available if necessary, but he should be fine with a low risk surgery.  Recommend proceeding to the OR without further evaluation.  He is unable to achieve 4 METS, but has not had any anginal or heart failure symptoms.    .      Relevant Orders   EKG 12-Lead (Completed)   OSA on CPAP (Chronic)    BiPAP 15 being we have reassessed and placed back on CPAP.  Needs to work with sleep medicine to figure out the best way.      Other emphysema ([place]) (Chronic)    Long-term smoker.  Likely contributing to exertional dyspnea.      Type 2 diabetes mellitus without complication, without long-term current use of insulin (HCC) (Chronic)    No recent A1c checked.  He is currently on Ozempic relatively newly started.  He seems to be tolerating it now.  With rest of the abdominal pain and discomfort.  Less Pain. He is also on glipizide.      Relevant Medications   lisinopril (ZESTRIL) 2.5 MG tablet   ===================================  HPI:    Jerome Sims is a morbidly obese 66 y.o. male former smoker (former drug use (cocaine, marijuana, barbiturates etc.) with PMH notable for HTN, DM-2, OSA but not on CPAP (intolerant) who is being seen today for the Preop Cardiovascular Evaluation at the request of Dorothyann Peng, NP.  Jerome Sims has been bouncing back  and forth between [facility] and [employer] based on his insurance company.  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  He does continue work some odd jobs here and there to help with bills.  Family history notable for father died from ruptured AAA, mother with COPD but also AAA.  Mother had a stroke.  He himself apparently had a cardiac evaluation 20 to 30 years ago with "changes in the heart due to cocaine use".  Jerome Sims was seen on July 01, 2019 by Dr. Dimas Alexandria for uncontrolled hypertension thought to be potentially related to being on NSAIDs.Marland Kitchen  Blood  pressures were better controlled after he restarted lisinopril at [place].  Put on salt restriction as well.  He was seen on September 14, 2021 by Riki Altes, MD [facility]) episodes of chest pain over the last several months.  Lasting 3 to 4 minutes some days where they would happen 8-10 times a day.  Not related to exertion still good progress.  No other associated symptoms.  Patient was referred to cardiology  Recent Hospitalizations: None  Reviewed  CV studies:    The following studies were reviewed today: (if available, images/films reviewed: From Epic Chart or Care Everywhere) None   Interval History:   Jerome Sims presents here today for preop evaluation for right knee surgery.  There is concerned because of the June visit for chest pain.  When asked about that episode, he mentioned that he had been started on Ozempic and was feeling gas pains.  It was left upper quadrant pain more so than Chest pain.  He indicated that once he stopped the Ozempic, this gas issue resolved, and he no longer had any more pain.  He has not had any more chest pain or abdominal pain since June.  He quit smoking several years ago.  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  He also stopped using CPAP, indicating that he could not tolerate the mask.  He is pretty sedentary at this point.  Limited by his back and knee pain.  He went on disability because of his back pain but right now it is needed as limiting the most.  Other than the obvious deconditioning related fatigue and exertional dyspnea, the only cardiac symptom he notes is mild end of day swelling.  CV Review of Symptoms (Summary) Cardiovascular ROS: positive for - dyspnea on exertion, edema, and exertional dyspnea is related to deconditioning negative for - chest pain, irregular heartbeat, orthopnea, palpitations, paroxysmal nocturnal dyspnea, rapid heart rate, shortness of breath, or  lightheadedness or dizziness, syncope/near syncope or TIA/amaurosis fugax, claudication  REVIEWED OF SYSTEMS   Review of Systems  Constitutional:  Positive for malaise/fatigue (Quite deconditioned.  Morbidly obese).  HENT:  Positive for congestion. Negative for nosebleeds.   Respiratory:  Positive for shortness of breath (Exertional). Negative for cough.   Cardiovascular:  Negative for claudication.  Gastrointestinal:  Negative for abdominal pain, blood in stool and melena.  Genitourinary:  Negative for dysuria and hematuria.  Musculoskeletal:  Positive for back pain and joint pain (Right knee pain).  Neurological:  Negative for dizziness, focal weakness and weakness.  Psychiatric/Behavioral:  Negative for depression and memory loss. The patient is not nervous/anxious.   All other systems reviewed and are negative.  I have reviewed and (if needed) personally updated the patient's problem list, medications,  allergies, past medical and surgical history, social and family history.   PAST MEDICAL HISTORY   Past Medical History:  Diagnosis Date   Arthritis    COPD (chronic obstructive pulmonary disease) ([place])    Diabetes mellitus without complication (HCC)    History of kidney stones    Hypertension    HX OF 1 YEAR AGO NEVER TOOK MEDS FOR   Obesity    Sleep apnea    Tobacco use     PAST SURGICAL HISTORY   Past Surgical History:  Procedure Laterality Date   ANTERIOR CERVICAL DECOMP/DISCECTOMY FUSION N/A 07/04/2017   Procedure: C5-6, C6-7, ANTERIOR CERVICAL DECOMPRESSION/DISCECTOMY FUSION,  ALLOGRAFT PLATE;  Surgeon: Marybelle Killings, MD;  Location: [facility];  Service: Orthopedics;  Laterality: N/A;   APPENDECTOMY     CARDIAC CATHETERIZATION     years ago    CARPAL TUNNEL RELEASE Right    CARPAL TUNNEL RELEASE Left 07/04/2017   Procedure: LEFT CARPAL TUNNEL RELEASE;  Surgeon: Marybelle Killings, MD;  Location: [facility];  Service: Orthopedics;  Laterality: Left;   CHOLECYSTECTOMY      COLONOSCOPY N/A 01/04/2017   Procedure: COLONOSCOPY;  Surgeon: Doran Stabler, MD;  Location: [facility];  Service: Gastroenterology;  Laterality: N/A;   KNEE SURGERY Right     Immunization History  Administered Date(s) Administered   Influenza Inj Mdck Quad Pf 01/22/2020   Influenza,inj,Quad PF,6+ Mos 01/03/2017, 01/12/2019, 01/07/2021   Influenza-Unspecified 12/15/2021   PNEUMOCOCCAL CONJUGATE-20 01/21/2021   Pneumococcal Polysaccharide-23 01/22/2020   Respiratory Syncytial Virus Vaccine,Recomb Aduvanted(Arexvy) 12/15/2021   Tdap 11/13/2014, 03/06/2019, 12/15/2021   Zoster Recombinat (Shingrix) 01/22/2020, 06/23/2020    MEDICATIONS/ALLERGIES   Current Meds  Medication Sig   acetaminophen (TYLENOL) 500 MG tablet Take 500 mg by mouth every 4 (four) hours as needed for mild pain.   glucose blood (ONETOUCH VERIO) test strip USE TO TEST BLOOD GLUCOSE ONCE DAILY   ibuprofen (ADVIL) 600 MG tablet Take by mouth.   Lancets (ONETOUCH ULTRASOFT) lancets USE TO TEST BLOOD GLUCOSE ONCE DAILY   lisinopril (ZESTRIL) 2.5 MG tablet Take 1 tablet by mouth daily.   meloxicam (MOBIC) 15 MG tablet Take 1 tablet by mouth daily.   methocarbamol (ROBAXIN) 500 MG tablet Take 1 tablet (500 mg total) by mouth every 8 (eight) hours as needed for muscle spasms.   OZEMPIC, 1 MG/DOSE, 4 MG/3ML SOPN Inject into the skin.   tiZANidine (ZANAFLEX) 4 MG tablet Take 4 mg by mouth at bedtime as needed.   triamcinolone ointment (KENALOG) 0.5 % Apply 1 application topically 2 (two) times daily. 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    Family History  Problem Relation Age of Onset   Heart defect Maternal Grandmother    AAA (abdominal aortic aneurysm) Mother    AAA (abdominal aortic aneurysm) Father    Colon cancer Neg Hx    Esophageal cancer Neg Hx    Pancreatic cancer Neg Hx    Rectal cancer Neg Hx    Stomach cancer Neg Hx    Diabetes Neg Hx     OBJCTIVE -PE, EKG, labs   Wt Readings from Last 3 Encounters:  02/15/22 (!) 312 lb 3.2 oz (141.6 kg)  02/10/22 (!) 308 lb (139.7 kg)  10/20/21 (!) 303 lb 12.8 oz (137.8 kg)    Physical Exam: BP 128/84 (BP Location: Left Arm, Patient Position: Sitting)   Pulse 68   Ht _0  (1.727 m)   Wt (!) 312 lb 3.2 oz (141.6 kg)   SpO2 96%   BMI 47.47 kg/m  Physical Exam Vitals reviewed.  Constitutional:      General: He is not in acute distress.    Appearance: He is obese. He is ill-appearing (Seizure chronically disabled). He is not toxic-appearing.     Comments: Well-groomed.  HENT:     Head: Normocephalic and atraumatic.  Neck:     Vascular: No carotid bruit or JVD (Unable to assess due to body habitus).  Cardiovascular:     Rate and Rhythm: Normal rate and regular rhythm. No extrasystoles are present.    Chest Wall: PMI is not displaced.     Pulses: Decreased pulses (Diminished due to body habitus.  Difficult to palpate.).     Heart sounds: S1 normal and S2 normal. Heart sounds are distant. No murmur heard.    No friction rub. No gallop.  Pulmonary:     Effort: Pulmonary effort is normal. No respiratory distress.     Breath sounds: No wheezing, rhonchi or rales.     Comments: Distant breath sounds but seem to be clear Abdominal:     General: Bowel sounds are normal. There is no  distension.     Palpations: Abdomen is soft.     Tenderness: There is no abdominal tenderness. There is no guarding or rebound.     Comments: Obesity unable to assess HSM  Musculoskeletal:        General: Swelling (Mild ankle swelling mostly right leg) present. Normal range of motion.     Cervical back: Normal range of motion and neck supple.  Skin:    General: Skin is warm and dry.  Neurological:     General: No focal deficit present.     Mental Status: He is alert and oriented to person, place, and time.     Gait: Gait abnormal (Significant antalgic gait).  Psychiatric:        Mood and Affect: Mood normal.        Behavior: Behavior normal.        Thought Content: Thought content normal.        Judgment: Judgment normal.      Adult ECG Report  Rate: 60;  Rhythm: normal sinus rhythm and borderline low voltage otherwise normal axis, intervals and durations. ;   Narrative Interpretation: Stable  Recent Labs: No recent labs. [place] Related to Lipid Panel Component 01/07/21 01/22/20  Triglycerides 109 89  Cholesterol 155 164  HDL 31 Low  36 Low   LDL Calculated 102 High   110 High    Non-HDL Cholesterol 124  128  CBC Component 05/07/21 04/30/21 04/22/21 Very worried about 01/07/21  WBC 4.9 9.7 6.4 4.9  RBC 4.33 4.75 4.46 4.28  HGB 12.6 Low  13.7 13.1 12.8 Low   HCT 36.8 Low  40.7 38.1 Low  37.3 Low   Platelet 301 375 199 196   Comprehensive Metabolic Panel Component 18/33/58 04/30/21 04/23/21  Sodium 139 137 132 Low   Potassium 3.8 3.9 3.8  Chloride 107 103 99  CO2 25.0 28.0 22.0  Anion Gap 7 6 Low  11  BUN 6 Low  16 30 High   Creatinine 0.90 1.00 1.30  eGFR CKD-EPI (2021) Male >90  85  62   Glucose 121 High  180 High  569 High Panic   Calcium 8.5 8.5 8.1 Low   Albumin 3.4 3.5 --  Total Protein 6.4 Low  7.0 --  Total Bilirubin 0.6 0.8 --  AST 28 31 --  ALT 22 26 --  Alkaline Phosphatase 93 101 --    Lab Results  Component Value Date   HGBA1C 6.6  (H) 06/28/2017   Lab Results  Component Value Date   TSH 2.99 10/26/2016    ================================================== I spent a total of 19 minutes with the patient spent in direct patient consultation.  Additional time spent with chart review  / charting (studies, outside notes, etc): 32 min Total Time: 51 min  Current medicines are reviewed at length with the patient today.  (+/- concerns) none  Notice: This dictation was prepared with Dragon dictation along with smart phrase technology. Any transcriptional errors that result from this process are unintentional and may not be corrected upon review.   Studies Ordered:  Orders Placed This Encounter  Procedures   EKG 12-Lead   No orders of the defined types were placed in this encounter.   Patient Instructions / Medication Changes & Studies & Tests Ordered   Patient Instructions  Medication Instructions:   No changes  *If you need a refill on your cardiac medications before your next appointment, please call your pharmacy*   Lab Work: Not Needed    Testing/Procedures:  Not needed  Follow-Up: At [facility], you and your health needs are our priority.  As part of our continuing mission to provide you with exceptional heart care, we have created designated Provider Care Teams.  These Care Teams include your primary Cardiologist (physician) and Advanced Practice Providers (APPs -  Physician Assistants and Nurse Practitioners) who all work together to provide you with the care you need, when you need it.     Your next appointment:   5 month(s)  The format for your next appointment:   In Person  Provider:   Glenetta Hew, MD    Other Instructions  Okay to have surgery with Dr Gretel Acre, MD, MS Glenetta Hew, M.D., M.S. Interventional Cardiologist  [place]  Pager # [phone] Phone # [phone] [address] [place], [place] [address]   Thank  you for choosing [facility] at [facility]!!

## 2022-02-15 NOTE — Patient Instructions (Signed)
Medication Instructions:   No changes  *If you need a refill on your cardiac medications before your next appointment, please call your pharmacy*   Lab Work: Not Needed    Testing/Procedures:  Not needed  Follow-Up: At [facility], you and your health needs are our priority.  As part of our continuing mission to provide you with exceptional heart care, we have created designated Provider Care Teams.  These Care Teams include your primary Cardiologist (physician) and Advanced Practice Providers (APPs -  Physician Assistants and Nurse Practitioners) who all work together to provide you with the care you need, when you need it.     Your next appointment:   5 month(s)  The format for your next appointment:   In Person  Provider:   Glenetta Hew, MD    Other Instructions  Okay to have surgery with Dr Lorin Mercy

## 2022-02-21 ENCOUNTER — Encounter: Payer: Self-pay | Admitting: Cardiology

## 2022-02-21 DIAGNOSIS — I10 Essential (primary) hypertension: Secondary | ICD-10-CM | POA: Insufficient documentation

## 2022-02-21 DIAGNOSIS — E1169 Type 2 diabetes mellitus with other specified complication: Secondary | ICD-10-CM | POA: Insufficient documentation

## 2022-02-21 DIAGNOSIS — Z01810 Encounter for preprocedural cardiovascular examination: Secondary | ICD-10-CM | POA: Insufficient documentation

## 2022-02-21 NOTE — Assessment & Plan Note (Signed)
BiPAP 15 being we have reassessed and placed back on CPAP.  Needs to work with sleep medicine to figure out the best way.

## 2022-02-21 NOTE — Assessment & Plan Note (Signed)
On any medications at present.  Warrants evaluation for baseline cardiovascular risk, but this should not keep him from having his knee surgery.  We can address this once his knee surgery is complete

## 2022-02-21 NOTE — Assessment & Plan Note (Signed)
BP pretty well controlled on pretty much modest dose of ACE inhibitor.  Appropriate for someone who has diabetes

## 2022-02-21 NOTE — Assessment & Plan Note (Signed)
Long-term smoker.  Likely contributing to exertional dyspnea.

## 2022-02-21 NOTE — Assessment & Plan Note (Signed)
No recent A1c checked.  He is currently on Ozempic relatively newly started.  He seems to be tolerating it now.  With rest of the abdominal pain and discomfort.  Less Pain. He is also on glipizide.

## 2022-02-21 NOTE — Assessment & Plan Note (Signed)
He has pending surgery which is a low risk surgery from a cardiac standpoint. No known coronary disease and no active cardiac symptoms.  No chest pain or pressure with rest exertion.  No CHF symptoms of PND, orthopnea. He does have diabetes, not on insulin.  Normal renal function.  No history of stroke.  Based on the revised cardiac risk index, he is a class I risk patient-relatively low risk patient for a relatively low risk surgery.  Would recommend proceeding with surgery without cardiac evaluation at this point.  We can reassess his cardiovascular risk later on Monday to assess his exercise tolerance without knee pain.  Cardiology will be available if necessary, but he should be fine with a low risk surgery.  Recommend proceeding to the OR without further evaluation.  He is unable to achieve 4 METS, but has not had any anginal or heart failure symptoms.    Marland Kitchen

## 2022-02-21 NOTE — Assessment & Plan Note (Signed)
Clearly playing a role in dyspnea but also his back and knee pain.  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  He is trying to adjust diet now with diabetes and hypertension.  Hard to lose weight and is not able to walk.

## 2022-03-31 ENCOUNTER — Other Ambulatory Visit: Payer: Self-pay | Admitting: Physician Assistant

## 2022-04-13 ENCOUNTER — Other Ambulatory Visit: Payer: Self-pay

## 2022-04-15 ENCOUNTER — Other Ambulatory Visit: Payer: Self-pay | Admitting: Physician Assistant

## 2022-04-15 ENCOUNTER — Encounter: Payer: Self-pay | Admitting: Physician Assistant

## 2022-04-15 ENCOUNTER — Ambulatory Visit (INDEPENDENT_AMBULATORY_CARE_PROVIDER_SITE_OTHER): Payer: Medicare Other | Admitting: Physician Assistant

## 2022-04-15 VITALS — BP 132/79 | HR 74 | Temp 98.6°F | Ht 68.5 in | Wt 313.6 lb

## 2022-04-15 DIAGNOSIS — M1711 Unilateral primary osteoarthritis, right knee: Secondary | ICD-10-CM

## 2022-04-15 NOTE — Progress Notes (Signed)
Office Visit Note   Patient: Jerome Sims           Date of Birth: 03-Nov-1955           MRN: [id number] Visit Date: 04/15/2022              Requested by: Dorothyann Peng, NP [place],  [place] [id number] PCP: Dorothyann Peng, NP  Chief Complaint  Patient presents with   Right Knee - Pain    Pre op      HPI: Jerome Sims is a pleasant 67 year old gentleman who is a patient of Dr. Lorin Mercy.  He comes in today for his preoperative history and physical for his upcoming right total knee arthroplasty.  He has failed conservative treatment and had an extensive discussion with Dr. Lorin Mercy regarding surgical expectations and recovery.  He says his health has been stable.  He denies any recent fever or chills.  His BMI currently is 46.  He is a diabetic and takes Ozempic.  I do not see a hemoglobin A1c in the chart since a year ago which was 7.1 though he says he has had 1 recently and it was 6.4 or so.  Denies any history of anesthesia complications denies any history of blood clots  Assessment & Plan: Visit Diagnoses: Right knee osteoarthritis.  Plan: Reviewed the risks of surgery with him which include bleeding infection anesthesia complications neurovascular damage blood clots need for revision.  Had an extensive discussion with him regarding to continue to keep his weight under control and monitor his blood sugars.  He does live alone and from reviewing clinic notes this was discussed with Dr. Lorin Mercy will plan on home health physical therapy all of his questions were answered today and H&P was dictated into the hospital system  Follow-Up Instructions: No follow-ups on file.   Ortho Exam  Patient is alert, oriented, no adenopathy, well-dressed, normal affect, normal respiratory effort. Right knee no effusion no erythema.  Compartments are soft and nontender he is neurovascular intact distally.  Imaging: No results found. No images are attached to the encounter.  Labs: Lab  Results  Component Value Date   HGBA1C 6.6 (H) 06/28/2017   HGBA1C 7.3 (H) 06/21/2017   HGBA1C 12.8 04/27/2017   ESRSEDRATE 20 04/27/2017   CRP 1.3 04/27/2017     Lab Results  Component Value Date   ALBUMIN 3.3 (L) 06/28/2017   ALBUMIN 3.8 04/27/2017   ALBUMIN 3.8 10/26/2016    No results found for: "MG" No results found for: "VD25OH"  No results found for: "PREALBUMIN"    Latest Ref Rng & Units 06/28/2017    9:54 AM 04/27/2017    2:31 PM 10/26/2016    5:39 PM  CBC EXTENDED  WBC 4.0 - 10.5 K/uL 6.7  5.9  6.8   RBC 4.22 - 5.81 MIL/uL 4.37  4.91  4.55   Hemoglobin 13.0 - 17.0 g/dL 13.2  14.9  14.0   HCT 39.0 - 52.0 % 39.3  43.1  40.6   Platelets 150 - 400 K/uL 206  197.0  217.0   NEUT# 1.4 - 7.7 K/uL  3.0  3.5   Lymph# 0.7 - 4.0 K/uL  2.3  2.5      Body mass index is 46.99 kg/m.  Orders:  No orders of the defined types were placed in this encounter.  No orders of the defined types were placed in this encounter.    Procedures: No procedures performed  Clinical Data: No  additional findings.  ROS:  All other systems negative, except as noted in the HPI. Review of Systems  Objective: Vital Signs: BP 132/79 (BP Location: Left Arm, Patient Position: Sitting, Cuff Size: Large)   Pulse 74   Temp 98.6 F (37 C) (Oral)   Ht 5' 8.5" (1.74 m)   Wt (!) 313 lb 9.6 oz (142.2 kg)   BMI 46.99 kg/m   Specialty Comments:  No specialty comments available.  PMFS History: Patient Active Problem List   Diagnosis Date Noted   Essential hypertension 02/21/2022   Hyperlipidemia associated with type 2 diabetes mellitus ([place]) 02/21/2022   Preoperative cardiovascular examination 02/21/2022   Unilateral primary osteoarthritis, right knee 02/14/2022   S/P cervical spinal fusion 07/15/2017   Type 2 diabetes mellitus without complication, without long-term current use of insulin ([place]) 05/31/2017   Left hip pain 03/24/2017   Carpal tunnel syndrome of left wrist 12/15/2016    Kidney stones 09/23/2016   Heavy smoker (more than 20 cigarettes per day) 09/21/2016   Sun-damaged skin 09/21/2016   Morbid obesity with BMI of 45.0-49.9, adult ([place]) 09/20/2016   OSA on CPAP 09/20/2016   Other emphysema ([place]) 09/20/2016   Past Medical History:  Diagnosis Date   Arthritis    COPD (chronic obstructive pulmonary disease) (HCC)    Diabetes mellitus without complication ([place])    History of kidney stones    Hypertension    HX OF 1 YEAR AGO NEVER TOOK MEDS FOR   Obesity    Sleep apnea    Tobacco use     Family History  Problem Relation Age of Onset   Heart defect Maternal Grandmother    AAA (abdominal aortic aneurysm) Mother    AAA (abdominal aortic aneurysm) Father    Colon cancer Neg Hx    Esophageal cancer Neg Hx    Pancreatic cancer Neg Hx    Rectal cancer Neg Hx    Stomach cancer Neg Hx    Diabetes Neg Hx     Past Surgical History:  Procedure Laterality Date   ANTERIOR CERVICAL DECOMP/DISCECTOMY FUSION N/A 07/04/2017   Procedure: C5-6, C6-7, ANTERIOR CERVICAL DECOMPRESSION/DISCECTOMY FUSION,  ALLOGRAFT PLATE;  Surgeon: Marybelle Killings, MD;  Location: [facility];  Service: Orthopedics;  Laterality: N/A;   APPENDECTOMY     CARDIAC CATHETERIZATION     years ago    CARPAL TUNNEL RELEASE Right    CARPAL TUNNEL RELEASE Left 07/04/2017   Procedure: LEFT CARPAL TUNNEL RELEASE;  Surgeon: Marybelle Killings, MD;  Location: [facility];  Service: Orthopedics;  Laterality: Left;   CHOLECYSTECTOMY     COLONOSCOPY N/A 01/04/2017   Procedure: COLONOSCOPY;  Surgeon: Doran Stabler, MD;  Location: [facility];  Service: Gastroenterology;  Laterality: N/A;   KNEE SURGERY Right    Social History   Occupational History   Not on file  Tobacco Use   Smoking status: Former    Packs/day: 1.00    Years: 30.00    Total pack years: 30.00    Types: Cigarettes   Smokeless tobacco: Never   Tobacco comments:    QUIT October 18 2016  Vaping Use   Vaping Use: Never used  Substance and Sexual  Activity   Alcohol use: No    Alcohol/week: 0.0 standard drinks of alcohol   Drug use: No   Sexual activity: Not on file

## 2022-04-15 NOTE — H&P (View-Only) (Signed)
TOTAL KNEE ADMISSION H&P  Patient is being admitted for right total knee arthroplasty.  Subjective:  Chief Complaint:right knee pain.  HPI: Jerome Sims, 67 y.o. male, has a history of pain and functional disability in the right knee due to arthritis and has failed non-surgical conservative treatments for greater than 12 weeks to includeNSAID's and/or analgesics, corticosteriod injections, weight reduction as appropriate, and activity modification.  Onset of symptoms was gradual, starting 4 years ago with gradually worsening course since that time. The patient noted no past surgery on the right knee(s).  Patient currently rates pain in the right knee(s) at 6 out of 10 with activity. Patient has night pain, worsening of pain with activity and weight bearing, and pain with passive range of motion.  Patient has evidence of subchondral cysts and periarticular osteophytes by imaging studies.. There is no active infection.  He denies any history of recent fever or chills or flu type symptoms.  Denies any recent change in his health status.  He was a smoker but quit 5 years ago.  He is a diabetic most recent A1c that I find is 7.1 but that was over a year ago and he says he has had 1 since that was in the sixes.  No history of anesthesia complications.  Patient Active Problem List   Diagnosis Date Noted   Essential hypertension 02/21/2022   Hyperlipidemia associated with type 2 diabetes mellitus ([place]) 02/21/2022   Preoperative cardiovascular examination 02/21/2022   Unilateral primary osteoarthritis, right knee 02/14/2022   S/P cervical spinal fusion 07/15/2017   Type 2 diabetes mellitus without complication, without long-term current use of insulin ([place]) 05/31/2017   Left hip pain 03/24/2017   Carpal tunnel syndrome of left wrist 12/15/2016   Kidney stones 09/23/2016   Heavy smoker (more than 20 cigarettes per day) 09/21/2016   Sun-damaged skin 09/21/2016   Morbid obesity with BMI of 45.0-49.9,  adult ([place]) 09/20/2016   OSA on CPAP 09/20/2016   Other emphysema ([place]) 09/20/2016   Past Medical History:  Diagnosis Date   Arthritis    COPD (chronic obstructive pulmonary disease) (HCC)    Diabetes mellitus without complication ([place])    History of kidney stones    Hypertension    HX OF 1 YEAR AGO NEVER TOOK MEDS FOR   Obesity    Sleep apnea    Tobacco use     Past Surgical History:  Procedure Laterality Date   ANTERIOR CERVICAL DECOMP/DISCECTOMY FUSION N/A 07/04/2017   Procedure: C5-6, C6-7, ANTERIOR CERVICAL DECOMPRESSION/DISCECTOMY FUSION,  ALLOGRAFT PLATE;  Surgeon: Marybelle Killings, MD;  Location: [facility];  Service: Orthopedics;  Laterality: N/A;   APPENDECTOMY     CARDIAC CATHETERIZATION     years ago    CARPAL TUNNEL RELEASE Right    CARPAL TUNNEL RELEASE Left 07/04/2017   Procedure: LEFT CARPAL TUNNEL RELEASE;  Surgeon: Marybelle Killings, MD;  Location: [facility];  Service: Orthopedics;  Laterality: Left;   CHOLECYSTECTOMY     COLONOSCOPY N/A 01/04/2017   Procedure: COLONOSCOPY;  Surgeon: Doran Stabler, MD;  [facility];  Service: Gastroenterology;  Laterality: N/A;   KNEE SURGERY Right     Current Outpatient Medications  Medication Sig Dispense Refill Last Dose   acetaminophen (TYLENOL) 500 MG tablet Take 500 mg by mouth every 4 (four) hours as needed for mild pain.      glucose blood (ONETOUCH VERIO) test strip USE TO TEST BLOOD GLUCOSE ONCE DAILY 100 each 12  ibuprofen (ADVIL) 600 MG tablet Take by mouth.      Lancets (ONETOUCH ULTRASOFT) lancets USE TO TEST BLOOD GLUCOSE ONCE DAILY 100 each 12    lisinopril (ZESTRIL) 2.5 MG tablet Take 1 tablet by mouth daily.      meloxicam (MOBIC) 15 MG tablet Take 1 tablet by mouth daily.      methocarbamol (ROBAXIN) 500 MG tablet Take 1 tablet (500 mg total) by mouth every 8 (eight) hours as needed for muscle spasms. 40 tablet 0    OZEMPIC, 1 MG/DOSE, 4 MG/3ML SOPN Inject into the skin.      tiZANidine (ZANAFLEX) 4 MG  tablet Take 4 mg by mouth at bedtime as needed.      triamcinolone ointment (KENALOG) 0.5 % Apply 1 application topically 2 (two) times daily. (Patient taking differently: Apply 1 application  topically 2 (two) times daily as needed (for eczema.).) 30 g 6    No current facility-administered medications for this visit.   No Known Allergies  Social History   Tobacco Use   Smoking status: Former    Packs/day: 1.00    Years: 30.00    Total pack years: 30.00    Types: Cigarettes   Smokeless tobacco: Never   Tobacco comments:    QUIT October 18 2016  Substance Use Topics   Alcohol use: No    Alcohol/week: 0.0 standard drinks of alcohol    Family History  Problem Relation Age of Onset   Heart defect Maternal Grandmother    AAA (abdominal aortic aneurysm) Mother    AAA (abdominal aortic aneurysm) Father    Colon cancer Neg Hx    Esophageal cancer Neg Hx    Pancreatic cancer Neg Hx    Rectal cancer Neg Hx    Stomach cancer Neg Hx    Diabetes Neg Hx      Review of Systems  All other systems reviewed and are negative.   Objective:  Physical Exam    Objective: Vital Signs: BP (!) 154/85   Pulse 75   Ht 5' 7.8" (1.722 m)   Wt (!) 308 lb (139.7 kg)   BMI 47.11 kg/m    Physical Exam Constitutional:      Appearance: He is well-developed.  HENT:     Head: Normocephalic and atraumatic.     Right Ear: External ear normal.     Left Ear: External ear normal.  Eyes:     Pupils: Pupils are equal, round, and reactive to light.  Neck:     Thyroid: No thyromegaly.     Trachea: No tracheal deviation.  Cardiovascular:     Rate and Rhythm: Normal rate.  Pulmonary:     Effort: Pulmonary effort is normal.     Breath sounds: No wheezing.  Lungs clear bilaterally Abdominal:     General: Bowel sounds are normal.     Palpations: Abdomen is soft.  Musculoskeletal:     Cervical back: Neck supple.  Skin:    General: Skin is warm and dry.     Capillary Refill: Capillary refill takes  less than 2 seconds.  Neurological:     Mental Status: He is alert and oriented to person, place, and time.  Psychiatric:        Behavior: Behavior normal.        Thought Content: Thought content normal.        Judgment: Judgment normal.       Office Visit Note  Patient: MOO GRAVLEY                                           Date of Birth: [birth year]                                                      MRN: [id number] Visit Date: 02/10/2022                                                                     Requested by: Dorothyann Peng, NP [place],  [place] [address] PCP: Dorothyann Peng, NP     Assessment & Plan: Visit Diagnoses:  1. Unilateral primary osteoarthritis, right knee       Plan: Patient with right knee osteoarthritis failed injections, anti-inflammatories, both cortisone and Visco injections, Tylenol, use of a cane and knee sleeve.  Plan right total knee arthroplasty we discussed spinal anesthesia, abductor block, Exparel Marcaine, likely overnight stay.  Has a family available once he is discharged and we discussed home physical therapy followed by outpatient therapy.  Questions elicited and answered he understands request to proceed.   Follow-Up Instructions: No follow-ups on file.    Orders:  No orders of the defined types were placed in this encounter.   No orders of the defined types were placed in this encounter.        Procedures: No procedures performed     Clinical Data: No additional findings.     Subjective:    Chief Complaint  Patient presents with   Right Knee - Pain      HPI 67 year old male here to discuss total knee arthroplasty.  Patient states he has pain all the time he has been taking anti-inflammatories meloxicam plus Tylenol without relief.  He been seen by another orthopedist in [place] but they were not contracted with his insurance and he was told that total knee arthroplasty was recommendation.   Patient does have some problems with sleep apnea and it is previous cervical fusion which is doing well.  Patient has diabetes last A1c was 7.1.   Review of Systems negative for stroke or cardiac problems.  Positive sleep apnea and history of smoking in the past.  Possible right knee primary osteoarthritis.     Objective: Vital Signs: BP (!) 154/85   Pulse 75   Ht 5' 7.8" (1.722 m)   Wt (!) 308 lb (139.7 kg)   BMI 47.11 kg/m    Physical Exam Constitutional:      Appearance: He is well-developed.  HENT:     Head: Normocephalic and atraumatic.     Right Ear: External ear normal.     Left Ear: External ear normal.  Eyes:     Pupils: Pupils are equal, round, and reactive to light.  Neck:     Thyroid: No thyromegaly.     Trachea: No tracheal deviation.  Cardiovascular:     Rate and Rhythm: Normal rate.  Pulmonary:  Effort: Pulmonary effort is normal.     Breath sounds: No wheezing.  Abdominal:     General: Bowel sounds are normal.     Palpations: Abdomen is soft.  Musculoskeletal:     Cervical back: Neck supple.  Skin:    General: Skin is warm and dry.     Capillary Refill: Capillary refill takes less than 2 seconds.  Neurological:     Mental Status: He is alert and oriented to person, place, and time.  Psychiatric:        Behavior: Behavior normal.        Thought Content: Thought content normal.        Judgment: Judgment normal.        Ortho Exam right knee crepitus.  Negative logroll hips.  Knee and ankle jerk are intact.  Crepitus with knee range of motion.  Medial lateral joint line tenderness.     Vital signs in last 24 hours: '@VSRANGES'$ @  Labs:   Estimated body mass index is 46.99 kg/m as calculated from the following:   Height as of an earlier encounter on 04/15/22: 5' 8.5" (1.74 m).   Weight as of an earlier encounter on 04/15/22: 142.2 kg.   Imaging Review Plain radiographs demonstrate severe degenerative joint disease of the right knee(s). The overall  alignment ismild varus. The bone quality appears to be good for age and reported activity level.      Assessment/Plan:  Plan: Patient with right knee osteoarthritis failed injections, anti-inflammatories, both cortisone and Visco injections, Tylenol, use of a cane and knee sleeve.  Plan right total knee arthroplasty we discussed spinal anesthesia, abductor block, Exparel Marcaine, likely overnight stay.  Has a family available once he is discharged and we discussed home physical therapy followed by outpatient therapy.  Questions elicited and answered.  Surgical risks again were reviewed patient wishes to proceed these include but are not limited to bleeding infection anesthesia complications blood clots need for revision surgery End stage arthritis, right knee   The patient history, physical examination, clinical judgment of the provider and imaging studies are consistent with end stage degenerative joint disease of the right knee(s) and total knee arthroplasty is deemed medically necessary. The treatment options including medical management, injection therapy arthroscopy and arthroplasty were discussed at length. The risks and benefits of total knee arthroplasty were presented and reviewed. The risks due to aseptic loosening, infection, stiffness, patella tracking problems, thromboembolic complications and other imponderables were discussed. The patient acknowledged the explanation, agreed to proceed with the plan and consent was signed. Patient is being admitted for inpatient treatment for surgery, pain control, PT, OT, prophylactic antibiotics, VTE prophylaxis, progressive ambulation and ADL's and discharge planning. The patient is planning to be discharged home with home health services     Patient's anticipated LOS is less than 2 midnights, meeting these requirements: - Younger than 31 - Lives within 1 hour of care - Has a competent adult at home to recover with post-op recover - NO history  of  - Chronic pain requiring opiods  - Diabetes  - Coronary Artery Disease  - Heart failure  - Heart attack  - Stroke  - DVT/VTE  - Cardiac arrhythmia  - Respiratory Failure/COPD  - Renal failure  - Anemia  - Advanced Liver disease

## 2022-04-15 NOTE — H&P (Signed)
TOTAL KNEE ADMISSION H&P  Patient is being admitted for right total knee arthroplasty.  Subjective:  Chief Complaint:right knee pain.  HPI: Jerome Sims, 67 y.o. male, has a history of pain and functional disability in the right knee due to arthritis and has failed non-surgical conservative treatments for greater than 12 weeks to includeNSAID's and/or analgesics, corticosteriod injections, weight reduction as appropriate, and activity modification.  Onset of symptoms was gradual, starting 4 years ago with gradually worsening course since that time. The patient noted no past surgery on the right knee(s).  Patient currently rates pain in the right knee(s) at 6 out of 10 with activity. Patient has night pain, worsening of pain with activity and weight bearing, and pain with passive range of motion.  Patient has evidence of subchondral cysts and periarticular osteophytes by imaging studies.. There is no active infection.  He denies any history of recent fever or chills or flu type symptoms.  Denies any recent change in his health status.  He was a smoker but quit 5 years ago.  He is a diabetic most recent A1c that I find is 7.1 but that was over a year ago and he says he has had 1 since that was in the sixes.  No history of anesthesia complications.  Patient Active Problem List   Diagnosis Date Noted   Essential hypertension 02/21/2022   Hyperlipidemia associated with type 2 diabetes mellitus ([place]) 02/21/2022   Preoperative cardiovascular examination 02/21/2022   Unilateral primary osteoarthritis, right knee 02/14/2022   S/P cervical spinal fusion 07/15/2017   Type 2 diabetes mellitus without complication, without long-term current use of insulin ([place]) 05/31/2017   Left hip pain 03/24/2017   Carpal tunnel syndrome of left wrist 12/15/2016   Kidney stones 09/23/2016   Heavy smoker (more than 20 cigarettes per day) 09/21/2016   Sun-damaged skin 09/21/2016   Morbid obesity with BMI of 45.0-49.9,  adult ([place]) 09/20/2016   OSA on CPAP 09/20/2016   Other emphysema ([place]) 09/20/2016   Past Medical History:  Diagnosis Date   Arthritis    COPD (chronic obstructive pulmonary disease) (H[facility])    Diabetes mellitus without complication ([place])    History of kidney stones    Hypertension    HX OF 1 YEAR AGO NEVER TOOK MEDS FOR   Obesity    Sleep apnea    Tobacco use     Past Surgical History:  Procedure Laterality Date   ANTERIOR CERVICAL DECOMP/DISCECTOMY FUSION N/A 07/04/2017   Procedure: C5-6, C6-7, ANTERIOR CERVICAL DECOMPRESSION/DISCECTOMY FUSION,  ALLOGRAFT PLATE;  Surgeon: Marybelle Killings, MD;  Location: [facility];  Service: Orthopedics;  Laterality: N/A;   APPENDECTOMY     CARDIAC CATHETERIZATION     years ago    CARPAL TUNNEL RELEASE Right    CARPAL TUNNEL RELEASE Left 07/04/2017   Procedure: LEFT CARPAL TUNNEL RELEASE;  Surgeon: Marybelle Killings, MD;  Location: [facility];  Service: Orthopedics;  Laterality: Left;   CHOLECYSTECTOMY     COLONOSCOPY N/A 01/04/2017   Procedure: COLONOSCOPY;  Surgeon: Doran Stabler, MD;  Location: [facility];  Service: Gastroenterology;  Laterality: N/A;   KNEE SURGERY Right     Current Outpatient Medications  Medication Sig Dispense Refill Last Dose   acetaminophen (TYLENOL) 500 MG tablet Take 500 mg by mouth every 4 (four) hours as needed for mild pain.      glucose blood (ONETOUCH VERIO) test strip USE TO TEST BLOOD GLUCOSE ONCE DAILY 100 each 12  ibuprofen (ADVIL) 600 MG tablet Take by mouth.      Lancets (ONETOUCH ULTRASOFT) lancets USE TO TEST BLOOD GLUCOSE ONCE DAILY 100 each 12    lisinopril (ZESTRIL) 2.5 MG tablet Take 1 tablet by mouth daily.      meloxicam (MOBIC) 15 MG tablet Take 1 tablet by mouth daily.      methocarbamol (ROBAXIN) 500 MG tablet Take 1 tablet (500 mg total) by mouth every 8 (eight) hours as needed for muscle spasms. 40 tablet 0    OZEMPIC, 1 MG/DOSE, 4 MG/3ML SOPN Inject into the skin.      tiZANidine (ZANAFLEX) 4 MG  tablet Take 4 mg by mouth at bedtime as needed.      triamcinolone ointment (KENALOG) 0.5 % Apply 1 application topically 2 (two) times daily. (Patient taking differently: Apply 1 application  topically 2 (two) times daily as needed (for eczema.).) 30 g 6    No current facility-administered medications for this visit.   No Known Allergies  Social History   Tobacco Use   Smoking status: Former    Packs/day: 1.00    Years: 30.00    Total pack years: 30.00    Types: Cigarettes   Smokeless tobacco: Never   Tobacco comments:    QUIT October 18 2016  Substance Use Topics   Alcohol use: No    Alcohol/week: 0.0 standard drinks of alcohol    Family History  Problem Relation Age of Onset   Heart defect Maternal Grandmother    AAA (abdominal aortic aneurysm) Mother    AAA (abdominal aortic aneurysm) Father    Colon cancer Neg Hx    Esophageal cancer Neg Hx    Pancreatic cancer Neg Hx    Rectal cancer Neg Hx    Stomach cancer Neg Hx    Diabetes Neg Hx      Review of Systems  All other systems reviewed and are negative.   Objective:  Physical Exam    Objective: Vital Signs: BP (!) 154/85   Pulse 75   Ht 5' 7.8" (1.722 m)   Wt (!) 308 lb (139.7 kg)   BMI 47.11 kg/m    Physical Exam Constitutional:      Appearance: He is well-developed.  HENT:     Head: Normocephalic and atraumatic.     Right Ear: External ear normal.     Left Ear: External ear normal.  Eyes:     Pupils: Pupils are equal, round, and reactive to light.  Neck:     Thyroid: No thyromegaly.     Trachea: No tracheal deviation.  Cardiovascular:     Rate and Rhythm: Normal rate.  Pulmonary:     Effort: Pulmonary effort is normal.     Breath sounds: No wheezing.  Lungs clear bilaterally Abdominal:     General: Bowel sounds are normal.     Palpations: Abdomen is soft.  Musculoskeletal:     Cervical back: Neck supple.  Skin:    General: Skin is warm and dry.     Capillary Refill: Capillary refill takes  less than 2 seconds.  Neurological:     Mental Status: He is alert and oriented to person, place, and time.  Psychiatric:        Behavior: Behavior normal.        Thought Content: Thought content normal.        Judgment: Judgment normal.       Office Visit Note  Patient: Jerome Sims                                           Date of Birth: 10/11/1955                                                      MRN: [id number] Visit Date: 02/10/2022                                                                     Requested by: Dorothyann Peng, NP [address] [place],  [place] [address] PCP: Dorothyann Peng, NP     Assessment & Plan: Visit Diagnoses:  1. Unilateral primary osteoarthritis, right knee       Plan: Patient with right knee osteoarthritis failed injections, anti-inflammatories, both cortisone and Visco injections, Tylenol, use of a cane and knee sleeve.  Plan right total knee arthroplasty we discussed spinal anesthesia, abductor block, Exparel Marcaine, likely overnight stay.  Has a family available once he is discharged and we discussed home physical therapy followed by outpatient therapy.  Questions elicited and answered he understands request to proceed.   Follow-Up Instructions: No follow-ups on file.    Orders:  No orders of the defined types were placed in this encounter.   No orders of the defined types were placed in this encounter.        Procedures: No procedures performed     Clinical Data: No additional findings.     Subjective:    Chief Complaint  Patient presents with   Right Knee - Pain      HPI 67 year old male here to discuss total knee arthroplasty.  Patient states he has pain all the time he has been taking anti-inflammatories meloxicam plus Tylenol without relief.  He been seen by another orthopedist in [place] but they were not contracted with his insurance and he was told that total knee arthroplasty was recommendation.   Patient does have some problems with sleep apnea and it is previous cervical fusion which is doing well.  Patient has diabetes last A1c was 7.1.   Review of Systems negative for stroke or cardiac problems.  Positive sleep apnea and history of smoking in the past.  Possible right knee primary osteoarthritis.     Objective: Vital Signs: BP (!) 154/85   Pulse 75   Ht 5' 7.8" (1.722 m)   Wt (!) 308 lb (139.7 kg)   BMI 47.11 kg/m    Physical Exam Constitutional:      Appearance: He is well-developed.  HENT:     Head: Normocephalic and atraumatic.     Right Ear: External ear normal.     Left Ear: External ear normal.  Eyes:     Pupils: Pupils are equal, round, and reactive to light.  Neck:     Thyroid: No thyromegaly.     Trachea: No tracheal deviation.  Cardiovascular:     Rate and Rhythm: Normal rate.  Pulmonary:  Effort: Pulmonary effort is normal.     Breath sounds: No wheezing.  Abdominal:     General: Bowel sounds are normal.     Palpations: Abdomen is soft.  Musculoskeletal:     Cervical back: Neck supple.  Skin:    General: Skin is warm and dry.     Capillary Refill: Capillary refill takes less than 2 seconds.  Neurological:     Mental Status: He is alert and oriented to person, place, and time.  Psychiatric:        Behavior: Behavior normal.        Thought Content: Thought content normal.        Judgment: Judgment normal.        Ortho Exam right knee crepitus.  Negative logroll hips.  Knee and ankle jerk are intact.  Crepitus with knee range of motion.  Medial lateral joint line tenderness.     Vital signs in last 24 hours: '@VSRANGES'$ @  Labs:   Estimated body mass index is 46.99 kg/m as calculated from the following:   Height as of an earlier encounter on 04/15/22: 5' 8.5" (1.74 m).   Weight as of an earlier encounter on 04/15/22: 142.2 kg.   Imaging Review Plain radiographs demonstrate severe degenerative joint disease of the right knee(s). The overall  alignment ismild varus. The bone quality appears to be good for age and reported activity level.      Assessment/Plan:  Plan: Patient with right knee osteoarthritis failed injections, anti-inflammatories, both cortisone and Visco injections, Tylenol, use of a cane and knee sleeve.  Plan right total knee arthroplasty we discussed spinal anesthesia, abductor block, Exparel Marcaine, likely overnight stay.  Has a family available once he is discharged and we discussed home physical therapy followed by outpatient therapy.  Questions elicited and answered.  Surgical risks again were reviewed patient wishes to proceed these include but are not limited to bleeding infection anesthesia complications blood clots need for revision surgery End stage arthritis, right knee   The patient history, physical examination, clinical judgment of the provider and imaging studies are consistent with end stage degenerative joint disease of the right knee(s) and total knee arthroplasty is deemed medically necessary. The treatment options including medical management, injection therapy arthroscopy and arthroplasty were discussed at length. The risks and benefits of total knee arthroplasty were presented and reviewed. The risks due to aseptic loosening, infection, stiffness, patella tracking problems, thromboembolic complications and other imponderables were discussed. The patient acknowledged the explanation, agreed to proceed with the plan and consent was signed. Patient is being admitted for inpatient treatment for surgery, pain control, PT, OT, prophylactic antibiotics, VTE prophylaxis, progressive ambulation and ADL's and discharge planning. The patient is planning to be discharged home with home health services     Patient's anticipated LOS is less than 2 midnights, meeting these requirements: - Younger than 64 - Lives within 1 hour of care - Has a competent adult at home to recover with post-op recover - NO history  of  - Chronic pain requiring opiods  - Diabetes  - Coronary Artery Disease  - Heart failure  - Heart attack  - Stroke  - DVT/VTE  - Cardiac arrhythmia  - Respiratory Failure/COPD  - Renal failure  - Anemia  - Advanced Liver disease

## 2022-04-19 NOTE — Pre-Procedure Instructions (Signed)
Surgical Instructions    Your procedure is scheduled on Monday, January 15.  Report to [facility] Main Entrance "A" at 10:30 A.M., then check in with the Admitting office.  Call this number if you have problems the morning of surgery:  [phone]   If you have any questions prior to your surgery date call [phone]: Open Monday-Friday 8am-4pm If you experience any cold or flu symptoms such as cough, fever, chills, shortness of breath, etc. between now and your scheduled surgery, please notify us at the above number     Remember:  Do not eat after midnight the night before your surgery  You may drink clear liquids until 9:30AM the morning of your surgery.   Clear liquids allowed are: Water, Non-Citrus Juices (without pulp), Carbonated Beverages, Clear Tea, Black Coffee ONLY (NO MILK, CREAM OR POWDERED CREAMER of any kind), and Gatorade  Patient Instructions  The night before surgery:  No food after midnight. ONLY clear liquids after midnight    The day of surgery (if you have diabetes): Drink ONE (1) 12 oz G2 given to you in your pre admission testing appointment by 9:30AM the morning of surgery. Drink in one sitting. Do not sip.  This drink was given to you during your hospital  pre-op appointment visit.  Nothing else to drink after completing the  12 oz bottle of G2.         If you have questions, please contact your surgeon's office.     Take these medicines the morning of surgery with A SIP OF WATER:  acetaminophen (TYLENOL) if needed tiZANidine (ZANAFLEX)  if needed   As of today, STOP taking any Aspirin (unless otherwise instructed by your surgeon) Aleve, Naproxen, Ibuprofen, Motrin, Advil, Goody's, BC's, all herbal medications, fish oil, and all vitamins.  WHAT DO I DO ABOUT MY DIABETES MEDICATION?  **PLEASE HOLD Ozempic for 7 days prior to surgery. DO NOT TAKE AFTER 04/18/22.   Do not take oral diabetes medicines (pills) the morning of surgery.   The day of  surgery, do not take other diabetes injectables, including Byetta (exenatide), Bydureon (exenatide ER), Victoza (liraglutide), or Trulicity (dulaglutide).  If your CBG is greater than 220 mg/dL, you may take  of your sliding scale (correction) dose of insulin.   HOW TO MANAGE YOUR DIABETES BEFORE AND AFTER SURGERY  Why is it important to control my blood sugar before and after surgery? Improving blood sugar levels before and after surgery helps healing and can limit problems. A way of improving blood sugar control is eating a healthy diet by:  Eating less sugar and carbohydrates  Increasing activity/exercise  Talking with your doctor about reaching your blood sugar goals High blood sugars (greater than 180 mg/dL) can raise your risk of infections and slow your recovery, so you will need to focus on controlling your diabetes during the weeks before surgery. Make sure that the doctor who takes care of your diabetes knows about your planned surgery including the date and location.  How do I manage my blood sugar before surgery? Check your blood sugar at least 4 times a day, starting 2 days before surgery, to make sure that the level is not too high or low.  Check your blood sugar the morning of your surgery when you wake up and every 2 hours until you get to the Short Stay unit.  If your blood sugar is less than 70 mg/dL, you will need to treat for low blood sugar: Do not take  insulin. Treat a low blood sugar (less than 70 mg/dL) with  cup of clear juice (cranberry or apple), 4 glucose tablets, OR glucose gel. Recheck blood sugar in 15 minutes after treatment (to make sure it is greater than 70 mg/dL). If your blood sugar is not greater than 70 mg/dL on recheck, call [phone] for further instructions. Report your blood sugar to the short stay nurse when you get to Short Stay.  If you are admitted to the hospital after surgery: Your blood sugar will be checked by the staff and you will  probably be given insulin after surgery (instead of oral diabetes medicines) to make sure you have good blood sugar levels. The goal for blood sugar control after surgery is 80-180 mg/dL.    [facility] is not responsible for any belongings or valuables.    Do NOT Smoke (Tobacco/Vaping)  24 hours prior to your procedure  If you use a CPAP at night, you may bring your mask for your overnight stay.   Contacts, glasses, hearing aids, dentures or partials may not be worn into surgery, please bring cases for these belongings   For patients admitted to the hospital, discharge time will be determined by your treatment team.   Patients discharged the day of surgery will not be allowed to drive home, and someone needs to stay with them for 24 hours.   SURGICAL WAITING ROOM VISITATION Patients having surgery or a procedure may have no more than 2 support people in the waiting area - these visitors may rotate.   Children under the age of 20 must have an adult with them who is not the patient. If the patient needs to stay at the hospital during part of their recovery, the visitor guidelines for inpatient rooms apply. Pre-op nurse will coordinate an appropriate time for 1 support person to accompany patient in pre-op.  This support person may not rotate.   Please refer to [email or web address][employer]hu for the visitor guidelines for Inpatients (after your surgery is over and you are in a regular room).    Special instructions:    Oral Hygiene is also important to reduce your risk of infection.  Remember - BRUSH YOUR TEETH THE MORNING OF SURGERY WITH YOUR REGULAR TOOTHPASTE   [facility]- Preparing For Surgery  Before surgery, you can play an important role. Because skin is not sterile, your skin needs to be as free of germs as possible. You can reduce the number of germs on your skin by washing with CHG (chlorahexidine gluconate) Soap before surgery.  CHG  is an antiseptic cleaner which kills germs and bonds with the skin to continue killing germs even after washing.     Please do not use if you have an allergy to CHG or antibacterial soaps. If your skin becomes reddened/irritated stop using the CHG.  Do not shave (including legs and underarms) for at least 48 hours prior to first CHG shower. It is OK to shave your face.  Please follow these instructions carefully.     Shower the NIGHT BEFORE SURGERY and the MORNING OF SURGERY with CHG Soap.   If you chose to wash your hair, wash your hair first as usual with your normal shampoo. After you shampoo, rinse your hair and body thoroughly to remove the shampoo.  Then [employer] and genitals (private parts) with your normal soap and rinse thoroughly to remove soap.  After that Use CHG Soap as you would any other liquid soap. You can apply CHG directly  to the skin and wash gently with a scrungie or a clean washcloth.   Apply the CHG Soap to your body ONLY FROM THE NECK DOWN.  Do not use on open wounds or open sores. Avoid contact with your eyes, ears, mouth and genitals (private parts). Wash Face and genitals (private parts)  with your normal soap.   Wash thoroughly, paying special attention to the area where your surgery will be performed.  Thoroughly rinse your body with warm water from the neck down.  DO NOT shower/wash with your normal soap after using and rinsing off the CHG Soap.  Pat yourself dry with a CLEAN TOWEL.  Wear CLEAN PAJAMAS to bed the night before surgery  Place CLEAN SHEETS on your bed the night before your surgery  DO NOT SLEEP WITH PETS.   Day of Surgery:  Take a shower with CHG soap. Wear Clean/Comfortable clothing the morning of surgery Do not wear jewelry or makeup. Do not wear lotions, powders, perfumes/cologne or deodorant. Do not shave 48 hours prior to surgery.  Men may shave face and neck. Do not bring valuables to the hospital. Do not wear nail polish, gel  polish, artificial nails, or any other type of covering on natural nails (fingers and toes) If you have artificial nails or gel coating that need to be removed by a nail salon, please have this removed prior to surgery. Artificial nails or gel coating may interfere with anesthesia's ability to adequately monitor your vital signs.   Remember to brush your teeth WITH YOUR REGULAR TOOTHPASTE.    If you received a COVID test during your pre-op visit, it is requested that you wear a mask when out in public, stay away from anyone that may not be feeling well, and notify your surgeon if you develop symptoms. If you have been in contact with anyone that has tested positive in the last 10 days, please notify your surgeon.    Please read over the following fact sheets that you were given.

## 2022-04-20 ENCOUNTER — Encounter (HOSPITAL_COMMUNITY): Payer: Self-pay

## 2022-04-20 ENCOUNTER — Other Ambulatory Visit: Payer: Self-pay

## 2022-04-20 ENCOUNTER — Encounter (HOSPITAL_COMMUNITY)
Admission: RE | Admit: 2022-04-20 | Discharge: 2022-04-20 | Disposition: A | Discharge status: Home / Self Care | Payer: Medicare Other | Source: Ambulatory Visit | Attending: Orthopaedic Surgery | Admitting: Orthopaedic Surgery

## 2022-04-20 VITALS — BP 150/83 | HR 77 | Temp 97.7°F | Resp 19 | Ht 68.0 in | Wt 308.0 lb

## 2022-04-20 DIAGNOSIS — J449 Chronic obstructive pulmonary disease, unspecified: Secondary | ICD-10-CM | POA: Diagnosis not present

## 2022-04-20 DIAGNOSIS — G4730 Sleep apnea, unspecified: Secondary | ICD-10-CM | POA: Insufficient documentation

## 2022-04-20 DIAGNOSIS — Z7985 Long-term (current) use of injectable non-insulin antidiabetic drugs: Secondary | ICD-10-CM | POA: Insufficient documentation

## 2022-04-20 DIAGNOSIS — E119 Type 2 diabetes mellitus without complications: Secondary | ICD-10-CM | POA: Insufficient documentation

## 2022-04-20 DIAGNOSIS — Z9049 Acquired absence of other specified parts of digestive tract: Secondary | ICD-10-CM | POA: Diagnosis not present

## 2022-04-20 DIAGNOSIS — M1711 Unilateral primary osteoarthritis, right knee: Secondary | ICD-10-CM | POA: Insufficient documentation

## 2022-04-20 DIAGNOSIS — Z87442 Personal history of urinary calculi: Secondary | ICD-10-CM | POA: Insufficient documentation

## 2022-04-20 DIAGNOSIS — Z01812 Encounter for preprocedural laboratory examination: Secondary | ICD-10-CM | POA: Diagnosis present

## 2022-04-20 DIAGNOSIS — Z01818 Encounter for other preprocedural examination: Secondary | ICD-10-CM

## 2022-04-20 HISTORY — DX: Other complications of anesthesia, initial encounter: T88.59XA

## 2022-04-20 LAB — BASIC METABOLIC PANEL
Anion gap: 9 (ref 5–15)
BUN: 13 mg/dL (ref 8–23)
CO2: 24 mmol/L (ref 22–32)
Calcium: 9.1 mg/dL (ref 8.9–10.3)
Chloride: 103 mmol/L (ref 98–111)
Creatinine, Ser: 1.13 mg/dL (ref 0.61–1.24)
GFR, Estimated: >60 mL/min (ref >60)
Glucose, Bld: 122 mg/dL — ABNORMAL HIGH (ref 70–99)
Potassium: 4.1 mmol/L (ref 3.5–5.1)
Sodium: 136 mmol/L (ref 135–145)

## 2022-04-20 LAB — TYPE AND SCREEN
ABO/RH(D): A POS
Antibody Screen: NEGATIVE

## 2022-04-20 LAB — CBC
HCT: 40.8 % (ref 39.0–52.0)
Hemoglobin: 14.1 g/dL (ref 13.0–17.0)
MCH: 29.9 pg (ref 26.0–34.0)
MCHC: 34.6 g/dL (ref 30.0–36.0)
MCV: 86.6 fL (ref 80.0–100.0)
Platelets: 230 10*3/uL (ref 150–400)
RBC: 4.71 MIL/uL (ref 4.22–5.81)
RDW: 13.2 % (ref 11.5–15.5)
WBC: 7.7 10*3/uL (ref 4.0–10.5)
nRBC: 0 % (ref 0.0–0.2)

## 2022-04-20 LAB — GLUCOSE, CAPILLARY: Glucose-Capillary: 159 mg/dL — ABNORMAL HIGH (ref 70–99)

## 2022-04-20 LAB — SURGICAL PCR SCREEN
MRSA, PCR: NEGATIVE
Staphylococcus aureus: NEGATIVE

## 2022-04-20 LAB — HEMOGLOBIN A1C
Hgb A1c MFr Bld: 7.2 % — ABNORMAL HIGH (ref 4.8–5.6)
Mean Plasma Glucose: 159.94 mg/dL

## 2022-04-20 NOTE — Progress Notes (Addendum)
PCP -  Dorothyann Peng NP (Has also seen someone at [facility]) Cardiologist -  Glenetta Hew ( Surgery Clearance 02/15/2022  PPM/ICD - Denies  Chest x-ray - NI EKG - 02/15/2022 Stress Test -  " long time ago" ECHO - denies Cardiac Cath - Denies  Sleep Study -  yes, was diagnosed with OSA  CPAP -  Not currently needed  DM TYPE TWO CBG @ PAT appt: 150 Fasting Blood Sugar - 100 Checks Blood Sugar _2____ times a month  Last dose of GLP1 agonist-  Friday, 04/16/2022 GLP1 instructions: Will hold until after surgery  Blood Thinner Instructions: N/A Aspirin Instructions: N/A Called patient to let him know to hold [facility] until after surgery. Left message. 1643 04/20/22  ERAS Protcol - Yes PRE-SURGERY  G2- Given  COVID TEST- NI   Anesthesia review: Yes, Cardiac clearance received  Patient denies shortness of breath, fever, cough and chest pain at PAT appointment   All instructions explained to the patient, with a verbal understanding of the material. Patient agrees to go over the instructions while at home for a better understanding. The opportunity to ask questions was provided.

## 2022-04-21 NOTE — Anesthesia Preprocedure Evaluation (Addendum)
Anesthesia Evaluation  Patient identified by MRN, date of birth, ID band Patient awake    Reviewed: Allergy & Precautions, NPO status , Patient's Chart, lab work & pertinent test results  History of Anesthesia Complications Negative for: history of anesthetic complications  Airway Mallampati: III  TM Distance: >3 FB Neck ROM: Full    Dental  (+) Edentulous Lower, Edentulous Upper   Pulmonary sleep apnea , COPD, former smoker   Pulmonary exam normal        Cardiovascular hypertension, Normal cardiovascular exam     Neuro/Psych    GI/Hepatic   Endo/Other  diabetes (Ozempic last >1 week ago), Type 2  Morbid obesity (BMI 47)  Renal/GU      Musculoskeletal   Abdominal   Peds  Hematology   Anesthesia Other Findings   Reproductive/Obstetrics                             Anesthesia Physical Anesthesia Plan  ASA: 3  Anesthesia Plan: Spinal   Post-op Pain Management: Regional block* and Ofirmev IV (intra-op)*   Induction:   PONV Risk Score and Plan: 2 and Treatment may vary due to age or medical condition, Ondansetron, Propofol infusion, Dexamethasone and Midazolam  Airway Management Planned: Natural Airway and Simple Face Mask  Additional Equipment: None  Intra-op Plan:   Post-operative Plan:   Informed Consent: I have reviewed the patients History and Physical, chart, labs and discussed the procedure including the risks, benefits and alternatives for the proposed anesthesia with the patient or authorized representative who has indicated his/her understanding and acceptance.       Plan Discussed with: CRNA  Anesthesia Plan Comments: (PAT note written 04/21/2022 by Myra Gianotti, PA-C.  )       Anesthesia Quick Evaluation

## 2022-04-21 NOTE — Progress Notes (Signed)
Anesthesia Chart Review:  Case: [id number] Date/Time: 04/26/22 1215   Procedure: RIGHT TOTAL KNEE ARTHROPLASTY (Right: Knee) - RNFA REQUESTED   Anesthesia type: Spinal   Pre-op diagnosis: right knee osteoarthritis   Location: [facility]   Surgeons: Marybelle Killings, MD       DISCUSSION: Patient is a 67 year old male scheduled for the above procedure.  History includes former smoker (quit 04/12/2016), COPD, HTN, DM2, OSA (not using CPAP), nephrolithiasis, osteoarthritis, spinal surgery (C5-7 ACDF 07/04/2017), appendectomy, cholecystectomy.  BMI is consistent with morbid obesity.  He reports he woke up from anesthesia "swinging."  He had a preoperative cardiology evaluation by Dr. Ellyn Hack on 02/15/2022. He wrote, "Preoperative cardiovascular examination - Primary He has pending surgery which is a low risk surgery from a cardiac standpoint. No known coronary disease and no active cardiac symptoms.  No chest pain or pressure with rest exertion.  No CHF symptoms of PND, orthopnea. He does have diabetes, not on insulin.  Normal renal function.  No history of stroke.   Based on the revised cardiac risk index, he is a class I risk patient-relatively low risk patient for a relatively low risk surgery.   Would recommend proceeding with surgery without cardiac evaluation at this point.  We can reassess his cardiovascular risk later on Monday to assess his exercise tolerance without knee pain.  Cardiology will be available if necessary, but he should be fine with a low risk surgery.   Recommend proceeding to the OR without further evaluation.  He is unable to achieve 4 METS, but has not had any anginal or heart failure symptoms."  Reported last dose of Ozempic 04/16/2022.  Anesthesia team to evaluate on the day of surgery.   VS: BP (!) 150/83   Pulse 77   Temp 36.5 C   Resp 19   Ht '5\' 8"'$  (1.727 m)   Wt (!) 139.7 kg   SpO2 97%   BMI 46.83 kg/m    PROVIDERS: Dorothyann Peng, NP is  PCP Glenetta Hew, MD is cardiologist   LABS: Labs reviewed: Acceptable for surgery. (all labs ordered are listed, but only abnormal results are displayed)  Labs Reviewed  GLUCOSE, CAPILLARY - Abnormal; Notable for the following components:      Result Value   Glucose-Capillary 159 (*)    All other components within normal limits  HEMOGLOBIN A1C - Abnormal; Notable for the following components:   Hgb A1c MFr Bld 7.2 (*)    All other components within normal limits  BASIC METABOLIC PANEL - Abnormal; Notable for the following components:   Glucose, Bld 122 (*)    All other components within normal limits  SURGICAL PCR SCREEN  CBC  TYPE AND SCREEN    EKG: 02/15/22: NSR   CV: He reported a remote history of a stress test.  Past Medical History:  Diagnosis Date   Arthritis    Complication of anesthesia    Woke up swinging   COPD (chronic obstructive pulmonary disease) ([place])    Diabetes mellitus without complication (HCC)    History of kidney stones    Hypertension    HX OF 1 YEAR AGO NEVER TOOK MEDS FOR   Obesity    Sleep apnea    Tobacco use     Past Surgical History:  Procedure Laterality Date   ANTERIOR CERVICAL DECOMP/DISCECTOMY FUSION N/A 07/04/2017   Procedure: C5-6, C6-7, ANTERIOR CERVICAL DECOMPRESSION/DISCECTOMY FUSION,  ALLOGRAFT PLATE;  Surgeon: Marybelle Killings, MD;  Location: [facility];  Service: Orthopedics;  Laterality: N/A;   APPENDECTOMY     back ablation  2023   Done at [facility]     years ago    [place] Right    CARPAL TUNNEL RELEASE Left 07/04/2017   Procedure: LEFT CARPAL TUNNEL RELEASE;  Surgeon: Marybelle Killings, MD;  Location: [facility];  Service: Orthopedics;  Laterality: Left;   CHOLECYSTECTOMY     COLONOSCOPY N/A 01/04/2017   Procedure: COLONOSCOPY;  Surgeon: Doran Stabler, MD;  Location: [facility];  Service: Gastroenterology;  Laterality: N/A;   KNEE SURGERY Right     MEDICATIONS:  gabapentin (NEURONTIN)  300 MG capsule   acetaminophen (TYLENOL) 500 MG tablet   glucose blood (ONETOUCH VERIO) test strip   Lancets (ONETOUCH ULTRASOFT) lancets   lisinopril (ZESTRIL) 2.5 MG tablet   meloxicam (MOBIC) 15 MG tablet   methocarbamol (ROBAXIN) 500 MG tablet   OZEMPIC, 1 MG/DOSE, 4 MG/3ML SOPN   tiZANidine (ZANAFLEX) 4 MG tablet   triamcinolone ointment (KENALOG) 0.5 %   No current facility-administered medications for this encounter.    Myra Gianotti, PA-C Surgical Short Stay/Anesthesiology [facility] Phone [phone] [facility] Phone [phone] 04/21/2022 5:28 PM

## 2022-04-26 ENCOUNTER — Observation Stay (HOSPITAL_COMMUNITY)
Admission: RE | Admit: 2022-04-26 | Discharge: 2022-04-29 | Disposition: A | Discharge status: Home Health | Payer: Medicare Other | Source: Ambulatory Visit | Attending: Orthopaedic Surgery | Admitting: Orthopaedic Surgery

## 2022-04-26 ENCOUNTER — Ambulatory Visit (HOSPITAL_COMMUNITY): Payer: Medicare Other | Admitting: Vascular Surgery

## 2022-04-26 ENCOUNTER — Other Ambulatory Visit: Payer: Self-pay

## 2022-04-26 ENCOUNTER — Ambulatory Visit (HOSPITAL_BASED_OUTPATIENT_CLINIC_OR_DEPARTMENT_OTHER): Payer: Medicare Other | Admitting: Certified Registered"

## 2022-04-26 ENCOUNTER — Encounter (HOSPITAL_COMMUNITY): Payer: Self-pay | Admitting: Orthopaedic Surgery

## 2022-04-26 ENCOUNTER — Encounter (HOSPITAL_COMMUNITY)
Admission: RE | Disposition: A | Discharge status: Home Health | Payer: Self-pay | Source: Ambulatory Visit | Attending: Orthopaedic Surgery

## 2022-04-26 DIAGNOSIS — Z79899 Other long term (current) drug therapy: Secondary | ICD-10-CM | POA: Insufficient documentation

## 2022-04-26 DIAGNOSIS — I10 Essential (primary) hypertension: Secondary | ICD-10-CM | POA: Insufficient documentation

## 2022-04-26 DIAGNOSIS — Z87891 Personal history of nicotine dependence: Secondary | ICD-10-CM

## 2022-04-26 DIAGNOSIS — M1711 Unilateral primary osteoarthritis, right knee: Secondary | ICD-10-CM | POA: Diagnosis not present

## 2022-04-26 DIAGNOSIS — Z96659 Presence of unspecified artificial knee joint: Secondary | ICD-10-CM

## 2022-04-26 DIAGNOSIS — J449 Chronic obstructive pulmonary disease, unspecified: Secondary | ICD-10-CM

## 2022-04-26 DIAGNOSIS — E119 Type 2 diabetes mellitus without complications: Secondary | ICD-10-CM | POA: Insufficient documentation

## 2022-04-26 HISTORY — PX: TOTAL KNEE ARTHROPLASTY: SHX125

## 2022-04-26 HISTORY — PX: REPLACEMENT TOTAL KNEE: SUR1224

## 2022-04-26 LAB — GLUCOSE, CAPILLARY
Glucose-Capillary: 164 mg/dL — ABNORMAL HIGH (ref 70–99)
Glucose-Capillary: 111 mg/dL — ABNORMAL HIGH (ref 70–99)
Glucose-Capillary: 263 mg/dL — ABNORMAL HIGH (ref 70–99)

## 2022-04-26 LAB — ABO/RH: ABO/RH(D): A POS

## 2022-04-26 SURGERY — ARTHROPLASTY, KNEE, TOTAL
Anesthesia: Spinal | Site: Knee | Laterality: Right

## 2022-04-26 MED ORDER — FENTANYL CITRATE (PF) 100 MCG/2ML IJ SOLN
INTRAMUSCULAR | Status: AC
  Administered 2022-04-26: 50 ug via INTRAVENOUS
  Filled 2022-04-26: qty 2

## 2022-04-26 MED ORDER — ACETAMINOPHEN 325 MG PO TABS
325.0000 mg | ORAL_TABLET | Freq: Four times a day (QID) | ORAL | Status: DC | PRN
  Administered 2022-04-28: 650 mg via ORAL
  Filled 2022-04-26: qty 2

## 2022-04-26 MED ORDER — SODIUM CHLORIDE 0.9 % IV SOLN: INTRAVENOUS | Status: DC

## 2022-04-26 MED ORDER — GABAPENTIN 300 MG PO CAPS: 300.0000 mg | ORAL_CAPSULE | Freq: Every day | ORAL | Status: DC | PRN

## 2022-04-26 MED ORDER — LACTATED RINGERS IV BOLUS
500.0000 mL | Freq: Once | INTRAVENOUS | Status: AC
  Administered 2022-04-26: 500 mL via INTRAVENOUS

## 2022-04-26 MED ORDER — TRIAMCINOLONE ACETONIDE 0.5 % EX OINT
1.0000 | TOPICAL_OINTMENT | Freq: Two times a day (BID) | CUTANEOUS | Status: DC
  Administered 2022-04-26: 1 via TOPICAL
  Filled 2022-04-26: qty 15

## 2022-04-26 MED ORDER — METOCLOPRAMIDE HCL 5 MG/ML IJ SOLN: 5.0000 mg | Freq: Three times a day (TID) | INTRAMUSCULAR | Status: DC | PRN

## 2022-04-26 MED ORDER — DEXTROSE 5 % IV SOLN
INTRAVENOUS | Status: DC | PRN
  Administered 2022-04-26: 3 g via INTRAVENOUS

## 2022-04-26 MED ORDER — INSULIN ASPART 100 UNIT/ML IJ SOLN
0.0000 [IU] | Freq: Three times a day (TID) | INTRAMUSCULAR | Status: DC
  Administered 2022-04-27: 3 [IU] via SUBCUTANEOUS
  Administered 2022-04-27: 2 [IU] via SUBCUTANEOUS
  Administered 2022-04-27: 3 [IU] via SUBCUTANEOUS
  Administered 2022-04-28 (×2): 2 [IU] via SUBCUTANEOUS
  Administered 2022-04-28: 3 [IU] via SUBCUTANEOUS
  Administered 2022-04-29: 2 [IU] via SUBCUTANEOUS

## 2022-04-26 MED ORDER — DIPHENHYDRAMINE HCL 12.5 MG/5ML PO ELIX: 12.5000 mg | ORAL_SOLUTION | ORAL | Status: DC | PRN

## 2022-04-26 MED ORDER — KETOROLAC TROMETHAMINE 30 MG/ML IJ SOLN
15.0000 mg | Freq: Once | INTRAMUSCULAR | Status: AC
  Administered 2022-04-26: 15 mg via INTRAVENOUS

## 2022-04-26 MED ORDER — BUPIVACAINE-EPINEPHRINE (PF) 0.5% -1:200000 IJ SOLN
INTRAMUSCULAR | Status: DC | PRN
  Administered 2022-04-26: 15 mL via PERINEURAL

## 2022-04-26 MED ORDER — BISACODYL 10 MG RE SUPP: 10.0000 mg | Freq: Every day | RECTAL | Status: DC | PRN

## 2022-04-26 MED ORDER — FENTANYL CITRATE (PF) 100 MCG/2ML IJ SOLN: 25.0000 ug | INTRAMUSCULAR | Status: DC | PRN

## 2022-04-26 MED ORDER — BUPIVACAINE HCL (PF) 0.25 % IJ SOLN
INTRAMUSCULAR | Status: AC
  Filled 2022-04-26: qty 30

## 2022-04-26 MED ORDER — SODIUM CHLORIDE 0.9 % IR SOLN
Status: DC | PRN
  Administered 2022-04-26: 3000 mL

## 2022-04-26 MED ORDER — LACTATED RINGERS IV SOLN: INTRAVENOUS | Status: DC

## 2022-04-26 MED ORDER — PHENOL 1.4 % MT LIQD: 1.0000 | OROMUCOSAL | Status: DC | PRN

## 2022-04-26 MED ORDER — BUPIVACAINE LIPOSOME 1.3 % IJ SUSP
INTRAMUSCULAR | Status: AC
  Filled 2022-04-26: qty 20

## 2022-04-26 MED ORDER — FENTANYL CITRATE (PF) 100 MCG/2ML IJ SOLN: 50.0000 ug | Freq: Once | INTRAMUSCULAR | Status: AC

## 2022-04-26 MED ORDER — PHENYLEPHRINE HCL-NACL 20-0.9 MG/250ML-% IV SOLN
INTRAVENOUS | Status: DC | PRN
  Administered 2022-04-26: 25 ug/min via INTRAVENOUS

## 2022-04-26 MED ORDER — PROPOFOL 500 MG/50ML IV EMUL
INTRAVENOUS | Status: DC | PRN
  Administered 2022-04-26: 25 ug/kg/min via INTRAVENOUS

## 2022-04-26 MED ORDER — TRANEXAMIC ACID-NACL 1000-0.7 MG/100ML-% IV SOLN
INTRAVENOUS | Status: DC | PRN
  Administered 2022-04-26: 1000 mg via INTRAVENOUS

## 2022-04-26 MED ORDER — LIDOCAINE 2% (20 MG/ML) 5 ML SYRINGE
INTRAMUSCULAR | Status: AC
  Filled 2022-04-26: qty 5

## 2022-04-26 MED ORDER — ACETAMINOPHEN 10 MG/ML IV SOLN
INTRAVENOUS | Status: AC
  Filled 2022-04-26: qty 100

## 2022-04-26 MED ORDER — HYDROMORPHONE HCL 1 MG/ML IJ SOLN: 0.5000 mg | INTRAMUSCULAR | Status: DC | PRN

## 2022-04-26 MED ORDER — OXYCODONE HCL 5 MG PO TABS
5.0000 mg | ORAL_TABLET | ORAL | Status: DC | PRN
  Administered 2022-04-26 – 2022-04-28 (×3): 10 mg via ORAL
  Filled 2022-04-26 (×2): qty 2
  Filled 2022-04-26 (×2): qty 1
  Filled 2022-04-26: qty 2

## 2022-04-26 MED ORDER — CEFAZOLIN IN SODIUM CHLORIDE 3-0.9 GM/100ML-% IV SOLN
3.0000 g | INTRAVENOUS | Status: AC
  Administered 2022-04-26: 3 g via INTRAVENOUS
  Filled 2022-04-26 (×2): qty 100

## 2022-04-26 MED ORDER — LIDOCAINE 2% (20 MG/ML) 5 ML SYRINGE
INTRAMUSCULAR | Status: DC | PRN
  Administered 2022-04-26: 20 mg via INTRAVENOUS

## 2022-04-26 MED ORDER — ONDANSETRON HCL 4 MG/2ML IJ SOLN
4.0000 mg | Freq: Four times a day (QID) | INTRAMUSCULAR | Status: DC | PRN
  Administered 2022-04-28: 4 mg via INTRAVENOUS
  Filled 2022-04-26: qty 2

## 2022-04-26 MED ORDER — MIDAZOLAM HCL 2 MG/2ML IJ SOLN
INTRAMUSCULAR | Status: DC | PRN
  Administered 2022-04-26: 1 mg via INTRAVENOUS

## 2022-04-26 MED ORDER — OXYCODONE HCL 5 MG/5ML PO SOLN: 5.0000 mg | Freq: Once | ORAL | Status: DC | PRN

## 2022-04-26 MED ORDER — 0.9 % SODIUM CHLORIDE (POUR BTL) OPTIME
TOPICAL | Status: DC | PRN
  Administered 2022-04-26: 1000 mL

## 2022-04-26 MED ORDER — SEMAGLUTIDE (1 MG/DOSE) 4 MG/3ML ~~LOC~~ SOPN: 1.0000 mg | PEN_INJECTOR | SUBCUTANEOUS | Status: DC

## 2022-04-26 MED ORDER — OXYCODONE HCL 5 MG PO TABS
10.0000 mg | ORAL_TABLET | ORAL | Status: DC | PRN
  Administered 2022-04-27: 10 mg via ORAL
  Administered 2022-04-27 – 2022-04-28 (×2): 15 mg via ORAL
  Administered 2022-04-29 (×2): 10 mg via ORAL
  Filled 2022-04-26: qty 2
  Filled 2022-04-26: qty 3
  Filled 2022-04-26: qty 2
  Filled 2022-04-26: qty 3

## 2022-04-26 MED ORDER — INSULIN DETEMIR 100 UNIT/ML ~~LOC~~ SOLN
8.0000 [IU] | Freq: Every day | SUBCUTANEOUS | Status: DC
  Administered 2022-04-26 – 2022-04-28 (×3): 8 [IU] via SUBCUTANEOUS
  Filled 2022-04-26 (×4): qty 0.08

## 2022-04-26 MED ORDER — MENTHOL 3 MG MT LOZG: 1.0000 | LOZENGE | OROMUCOSAL | Status: DC | PRN

## 2022-04-26 MED ORDER — PHENYLEPHRINE 80 MCG/ML (10ML) SYRINGE FOR IV PUSH (FOR BLOOD PRESSURE SUPPORT)
PREFILLED_SYRINGE | INTRAVENOUS | Status: DC | PRN
  Administered 2022-04-26: 25 ug via INTRAVENOUS
  Administered 2022-04-26 (×2): 80 ug via INTRAVENOUS

## 2022-04-26 MED ORDER — DOCUSATE SODIUM 100 MG PO CAPS
100.0000 mg | ORAL_CAPSULE | Freq: Two times a day (BID) | ORAL | Status: DC
  Administered 2022-04-27 – 2022-04-29 (×5): 100 mg via ORAL
  Filled 2022-04-26 (×5): qty 1

## 2022-04-26 MED ORDER — PROPOFOL 10 MG/ML IV BOLUS
INTRAVENOUS | Status: AC
  Filled 2022-04-26: qty 20

## 2022-04-26 MED ORDER — INSULIN ASPART 100 UNIT/ML IJ SOLN
4.0000 [IU] | Freq: Three times a day (TID) | INTRAMUSCULAR | Status: DC
  Administered 2022-04-27 – 2022-04-29 (×7): 4 [IU] via SUBCUTANEOUS

## 2022-04-26 MED ORDER — MIDAZOLAM HCL 2 MG/2ML IJ SOLN
INTRAMUSCULAR | Status: AC
  Administered 2022-04-26: 1 mg via INTRAVENOUS
  Filled 2022-04-26: qty 2

## 2022-04-26 MED ORDER — TRANEXAMIC ACID-NACL 1000-0.7 MG/100ML-% IV SOLN
INTRAVENOUS | Status: AC
  Filled 2022-04-26: qty 100

## 2022-04-26 MED ORDER — MIDAZOLAM HCL 2 MG/2ML IJ SOLN: 1.0000 mg | Freq: Once | INTRAMUSCULAR | Status: AC

## 2022-04-26 MED ORDER — BUPIVACAINE HCL (PF) 0.25 % IJ SOLN
INTRAMUSCULAR | Status: DC | PRN
  Administered 2022-04-26: 20 mL

## 2022-04-26 MED ORDER — INSULIN ASPART 100 UNIT/ML IJ SOLN
0.0000 [IU] | INTRAMUSCULAR | Status: DC | PRN
  Administered 2022-04-26: 2 [IU] via SUBCUTANEOUS
  Filled 2022-04-26: qty 1

## 2022-04-26 MED ORDER — MIDAZOLAM HCL 2 MG/2ML IJ SOLN
INTRAMUSCULAR | Status: AC
  Filled 2022-04-26: qty 2

## 2022-04-26 MED ORDER — CLONIDINE HCL (ANALGESIA) 100 MCG/ML EP SOLN
EPIDURAL | Status: DC | PRN
  Administered 2022-04-26: 100 ug

## 2022-04-26 MED ORDER — KETOROLAC TROMETHAMINE 15 MG/ML IJ SOLN
INTRAMUSCULAR | Status: AC
  Filled 2022-04-26: qty 1

## 2022-04-26 MED ORDER — PROPOFOL 10 MG/ML IV BOLUS
INTRAVENOUS | Status: DC | PRN
  Administered 2022-04-26: 40 mg via INTRAVENOUS
  Administered 2022-04-26: 30 mg via INTRAVENOUS
  Administered 2022-04-26: 50 mg via INTRAVENOUS
  Administered 2022-04-26: 30 mg via INTRAVENOUS

## 2022-04-26 MED ORDER — FENTANYL CITRATE (PF) 250 MCG/5ML IJ SOLN
INTRAMUSCULAR | Status: AC
  Filled 2022-04-26: qty 5

## 2022-04-26 MED ORDER — BUPIVACAINE IN DEXTROSE 0.75-8.25 % IT SOLN
INTRATHECAL | Status: DC | PRN
  Administered 2022-04-26: 2 mL via INTRATHECAL

## 2022-04-26 MED ORDER — ONDANSETRON HCL 4 MG PO TABS: 4.0000 mg | ORAL_TABLET | Freq: Four times a day (QID) | ORAL | Status: DC | PRN

## 2022-04-26 MED ORDER — ACETAMINOPHEN 10 MG/ML IV SOLN
1000.0000 mg | Freq: Once | INTRAVENOUS | Status: DC | PRN
  Administered 2022-04-26: 1000 mg via INTRAVENOUS

## 2022-04-26 MED ORDER — KETOROLAC TROMETHAMINE 30 MG/ML IJ SOLN
INTRAMUSCULAR | Status: AC
  Filled 2022-04-26: qty 1

## 2022-04-26 MED ORDER — ONDANSETRON HCL 4 MG/2ML IJ SOLN
INTRAMUSCULAR | Status: AC
  Filled 2022-04-26: qty 2

## 2022-04-26 MED ORDER — METOCLOPRAMIDE HCL 5 MG PO TABS: 5.0000 mg | ORAL_TABLET | Freq: Three times a day (TID) | ORAL | Status: DC | PRN

## 2022-04-26 MED ORDER — PROPOFOL 1000 MG/100ML IV EMUL
INTRAVENOUS | Status: AC
  Filled 2022-04-26: qty 100

## 2022-04-26 MED ORDER — BUPIVACAINE LIPOSOME 1.3 % IJ SUSP
INTRAMUSCULAR | Status: DC | PRN
  Administered 2022-04-26: 20 mL

## 2022-04-26 MED ORDER — ONDANSETRON HCL 4 MG/2ML IJ SOLN
INTRAMUSCULAR | Status: DC | PRN
  Administered 2022-04-26: 4 mg via INTRAVENOUS

## 2022-04-26 MED ORDER — LISINOPRIL 2.5 MG PO TABS
2.5000 mg | ORAL_TABLET | Freq: Every day | ORAL | Status: DC
  Administered 2022-04-27 – 2022-04-29 (×3): 2.5 mg via ORAL
  Filled 2022-04-26 (×3): qty 1

## 2022-04-26 MED ORDER — CHLORHEXIDINE GLUCONATE 0.12 % MT SOLN
15.0000 mL | Freq: Once | OROMUCOSAL | Status: AC
  Administered 2022-04-26: 15 mL via OROMUCOSAL
  Filled 2022-04-26: qty 15

## 2022-04-26 MED ORDER — TIZANIDINE HCL 4 MG PO TABS: 4.0000 mg | ORAL_TABLET | Freq: Every evening | ORAL | Status: DC | PRN

## 2022-04-26 MED ORDER — POLYETHYLENE GLYCOL 3350 17 G PO PACK: 17.0000 g | PACK | Freq: Every day | ORAL | Status: DC | PRN

## 2022-04-26 MED ORDER — ORAL CARE MOUTH RINSE: 15.0000 mL | Freq: Once | OROMUCOSAL | Status: AC

## 2022-04-26 MED ORDER — ACETAMINOPHEN 500 MG PO TABS
1000.0000 mg | ORAL_TABLET | Freq: Four times a day (QID) | ORAL | Status: AC
  Administered 2022-04-27 (×4): 1000 mg via ORAL
  Filled 2022-04-26 (×4): qty 2

## 2022-04-26 MED ORDER — OXYCODONE HCL 5 MG PO TABS: 5.0000 mg | ORAL_TABLET | Freq: Once | ORAL | Status: DC | PRN

## 2022-04-26 MED ORDER — ASPIRIN 325 MG PO TBEC
325.0000 mg | DELAYED_RELEASE_TABLET | Freq: Every day | ORAL | Status: DC
  Administered 2022-04-27 – 2022-04-29 (×3): 325 mg via ORAL
  Filled 2022-04-26 (×3): qty 1

## 2022-04-26 SURGICAL SUPPLY — 77 items
ATTUNE MED DOME PAT 38 KNEE (Knees) IMPLANT
ATTUNE PS FEM RT SZ 5 CEM KNEE (Femur) IMPLANT
ATTUNE PSRP INSR SZ5 5 KNEE (Insert) IMPLANT
BAG COUNTER SPONGE SURGICOUNT (BAG) ×1 IMPLANT
BAG SPNG CNTER NS LX DISP (BAG) ×1
BANDAGE ESMARK 6X9 LF (GAUZE/BANDAGES/DRESSINGS) ×1 IMPLANT
BASE TIBIA ATTUNE KNEE SYS SZ6 (Knees) IMPLANT
BLADE SAGITTAL 25.0X1.19X90 (BLADE) ×1 IMPLANT
BLADE SAW SGTL 13X75X1.27 (BLADE) ×1 IMPLANT
BNDG CMPR 9X6 STRL LF SNTH (GAUZE/BANDAGES/DRESSINGS) ×1
BNDG CMPR MED 10X6 ELC LF (GAUZE/BANDAGES/DRESSINGS) ×1
BNDG CMPR STD VLCR NS LF 5.8X4 (GAUZE/BANDAGES/DRESSINGS)
BNDG ELASTIC 4X5.8 VLCR NS LF (GAUZE/BANDAGES/DRESSINGS) ×1 IMPLANT
BNDG ELASTIC 4X5.8 VLCR STR LF (GAUZE/BANDAGES/DRESSINGS) ×1 IMPLANT
BNDG ELASTIC 6X10 VLCR STRL LF (GAUZE/BANDAGES/DRESSINGS) ×1 IMPLANT
BNDG ESMARK 6X9 LF (GAUZE/BANDAGES/DRESSINGS) ×1
BOWL SMART MIX CTS (DISPOSABLE) ×1 IMPLANT
BSPLAT TIB 6 CMNT ROT PLAT STR (Knees) ×1 IMPLANT
CEMENT HV SMART SET (Cement) ×2 IMPLANT
COVER SURGICAL LIGHT HANDLE (MISCELLANEOUS) ×1 IMPLANT
CUFF TOURN SGL QUICK 34 (TOURNIQUET CUFF) ×1
CUFF TOURN SGL QUICK 42 (TOURNIQUET CUFF) IMPLANT
CUFF TRNQT CYL 34X4.125X (TOURNIQUET CUFF) ×1 IMPLANT
DRAPE IMP U-DRAPE 54X76 (DRAPES) IMPLANT
DRAPE ORTHO SPLIT 77X108 STRL (DRAPES) ×2
DRAPE SURG ORHT 6 SPLT 77X108 (DRAPES) ×2 IMPLANT
DRAPE U-SHAPE 47X51 STRL (DRAPES) ×1 IMPLANT
DRSG MEPILEX POST OP 4X12 (GAUZE/BANDAGES/DRESSINGS) IMPLANT
DURAPREP 26ML APPLICATOR (WOUND CARE) ×2 IMPLANT
ELECT REM PT RETURN 9FT ADLT (ELECTROSURGICAL) ×1
ELECTRODE REM PT RTRN 9FT ADLT (ELECTROSURGICAL) ×1 IMPLANT
FACESHIELD WRAPAROUND (MASK) ×2 IMPLANT
FACESHIELD WRAPAROUND OR TEAM (MASK) ×2 IMPLANT
GAUZE PAD ABD 8X10 STRL (GAUZE/BANDAGES/DRESSINGS) ×1 IMPLANT
GAUZE XEROFORM 5X9 LF (GAUZE/BANDAGES/DRESSINGS) ×1 IMPLANT
GLOVE BIOGEL PI IND STRL 8 (GLOVE) ×2 IMPLANT
GLOVE ORTHO TXT STRL SZ7.5 (GLOVE) ×2 IMPLANT
GOWN STRL REUS W/ TWL LRG LVL3 (GOWN DISPOSABLE) ×1 IMPLANT
GOWN STRL REUS W/ TWL XL LVL3 (GOWN DISPOSABLE) ×1 IMPLANT
GOWN STRL REUS W/TWL 2XL LVL3 (GOWN DISPOSABLE) ×1 IMPLANT
GOWN STRL REUS W/TWL LRG LVL3 (GOWN DISPOSABLE) ×1
GOWN STRL REUS W/TWL XL LVL3 (GOWN DISPOSABLE) ×1
HANDPIECE INTERPULSE COAX TIP (DISPOSABLE) ×1
IMMOBILIZER KNEE 22 UNIV (SOFTGOODS) ×1 IMPLANT
KIT BASIN OR (CUSTOM PROCEDURE TRAY) ×1 IMPLANT
KIT TURNOVER KIT B (KITS) ×1 IMPLANT
MANIFOLD NEPTUNE II (INSTRUMENTS) ×1 IMPLANT
MARKER SKIN DUAL TIP RULER LAB (MISCELLANEOUS) ×1 IMPLANT
NDL 18GX1X1/2 (RX/OR ONLY) (NEEDLE) ×1 IMPLANT
NDL HYPO 25GX1X1/2 BEV (NEEDLE) ×1 IMPLANT
NEEDLE 18GX1X1/2 (RX/OR ONLY) (NEEDLE) ×1 IMPLANT
NEEDLE HYPO 25GX1X1/2 BEV (NEEDLE) ×1 IMPLANT
NS IRRIG 1000ML POUR BTL (IV SOLUTION) ×1 IMPLANT
PACK TOTAL JOINT (CUSTOM PROCEDURE TRAY) ×1 IMPLANT
PAD ARMBOARD 7.5X6 YLW CONV (MISCELLANEOUS) ×2 IMPLANT
PADDING CAST COTTON 6X4 STRL (CAST SUPPLIES) ×1 IMPLANT
PIN STEINMAN FIXATION KNEE (PIN) IMPLANT
SET HNDPC FAN SPRY TIP SCT (DISPOSABLE) ×1 IMPLANT
STAPLER VISISTAT 35W (STAPLE) IMPLANT
STOCKING THIGH LG REG (STOCKING) IMPLANT
SUCTION FRAZIER HANDLE 10FR (MISCELLANEOUS) ×1
SUCTION TUBE FRAZIER 10FR DISP (MISCELLANEOUS) ×1 IMPLANT
SUT VIC AB 0 CT1 27 (SUTURE) ×1
SUT VIC AB 0 CT1 27XBRD ANBCTR (SUTURE) ×1 IMPLANT
SUT VIC AB 1 CTX 36 (SUTURE) ×2
SUT VIC AB 1 CTX36XBRD ANBCTR (SUTURE) ×2 IMPLANT
SUT VIC AB 2-0 CT1 27 (SUTURE) ×2
SUT VIC AB 2-0 CT1 TAPERPNT 27 (SUTURE) ×2 IMPLANT
SYR 50ML LL SCALE MARK (SYRINGE) ×1 IMPLANT
SYR CONTROL 10ML LL (SYRINGE) ×1 IMPLANT
TIBIA ATTUNE KNEE SYS BASE SZ6 (Knees) ×1 IMPLANT
TIP HIGH FLOW IRRIGATION COAX (MISCELLANEOUS) IMPLANT
TOWEL GREEN STERILE (TOWEL DISPOSABLE) ×1 IMPLANT
TOWEL GREEN STERILE FF (TOWEL DISPOSABLE) ×1 IMPLANT
TRAY CATH 16FR W/PLASTIC CATH (SET/KITS/TRAYS/PACK) IMPLANT
TRAY FOLEY W/BAG SLVR 16FR (SET/KITS/TRAYS/PACK) ×1
TRAY FOLEY W/BAG SLVR 16FR ST (SET/KITS/TRAYS/PACK) IMPLANT

## 2022-04-26 NOTE — Op Note (Signed)
Plan postop diagnosis: Right knee primary osteoarthritis    Postop diagnosis same  Procedure: Right total knee arthroplasty  Surgeon: Rodell Perna, MD  Assistant: Wandra Arthurs  RNFA  Anesthesia preoperative adductor block plus spinal plus Exparel and Marcaine 20+20  Tourniquet's 300 time 60 minutes  Implants:Implants  CEMENT HV SMART SET - [id number]  Inventory Item: CEMENT HV SMART SET Serial no.: Model/Cat no.: [id number]  Implant name: CEMENT HV SMART SET - [id number] Laterality: Right Area: Knee  Manufacturer: [place] Date of Manufacture:   Action: Implanted Number Used: 2   Device Identifier: Device Identifier Type:   ATTUNE PSRP INSR SZ5 5 KNEE - [id number]  Inventory Item: ATTUNE PSRP INSR SZ5 5 KNEE Serial no.: Model/Cat no.: [id number]  Implant name: ATTUNE PSRP INSR SZ5 5 KNEE - [id number] Laterality: Right Area: Knee  Manufacturer: [place] Date of Manufacture:   Action: Implanted Number Used: 1   Device Identifier: Device Identifier Type:   ATTUNE MED DOME PAT 38 KNEE - [id number]  Inventory Item: ATTUNE MED DOME PAT 38 KNEE Serial no.: Model/Cat no.: [id number]  Implant name: ATTUNE MED DOME PAT 75 KNEE - [id number] Laterality: Right Area: Knee  Manufacturer: [place] Date of Manufacture:   Action: Implanted Number Used: 1   Device Identifier: Device Identifier Type:   ATTUNE PS FEM RT SZ 5 CEM KNEE - [id number]  Inventory Item: ATTUNE PS FEM RT SZ 5 CEM KNEE Serial no.: Model/Cat no.: [id number]  Implant name: ATTUNE PS FEM RT SZ 5 CEM KNEE - [id number] Laterality: Right Area: Knee  Manufacturer: [employer] Date of Manufacture:   Action: Implanted Number Used: 1   Device Identifier: Device Identifier Type:   TIBIA ATTUNE KNEE SYS BASE SZ6 - [id number]   Inventory Item: TIBIA ATTUNE KNEE SYS BASE SZ6 Serial no.: Model/Cat no.: [id number]  Implant name: TIBIA ATTUNE KNEE SYS BASE SZ6 - [id number] Laterality: Right Area: Knee   Manufacturer: [employer] Date of Manufacture:   Action: Implanted Number Used: 1   Device Identifier: Device Identifier Type:     After induction of spinal anesthesia with patient in supine position proximal thigh tourniquet lateral post heel bump preoperative Ancef prophylaxis IV TXA plain anesthesia prepped with DuraPrep tip of toes tourniquet the usual total knee sheets draped sterile skin marker impervious stockinette Coban was applied.  Betadine Steri-Drape sealed over the skin.  Timeout procedure completed leg elevated wrapped with Esmarch and tourniquet inflated.  Midline incision was made medial parapatellar incision.  Patient had valgus knee 14 degrees with bone-on-bone lateral compartment changes marginal osteophytes and exposed subchondral bone medially and patellofemoral.  Spurs removed 10 mm cut off patella intramedullary hole drilled in the femur and initially 10 mm was cut off the distal femur we came back and cut 2 more since the lateral condyle where minimal bone on the lateral condyle.  Initial resection 10 on the tibia with came back and took an additional 2 mm.  Degenerative meniscal remnants were resected.  Trial sizers were used after measuring the femur distal cut was made 5 mm spacer block line full extension good collateral balance.  Sizing for the femur was a 5.  In the tibia 6.  He will preparation leg not drilling for the femur after chamfer cuts box cuts were all made and marginal osteophytes were removed.  Trials gave full extension 38 mm patella.  Lug nuts and patella holes were drilled.  Vacuum mixing of the cement pulse lavage preparation of the bone drying  and then 6 cementing the tibia followed by femur placement of permanent 5 mm rotating platform and patella held with a clamp all excessive cement was removed./Lateral release was performed with patient's preoperative valgus starting at the inferior pole of the patella which led no touch patella tracking.  Repeat  irrigation Marcaine plus Exparel 20+20 infiltrated tourniquet deflated hemostasis of plain standard layered closure with #1 Vicryl closing the medial parapatellar deep retinaculum subcutaneous reapproximation superficial retinaculum 202 on subtenons tissue skin staple closure.  Postop dressing was applied knee immobilizer and transferred to care room.

## 2022-04-26 NOTE — Anesthesia Procedure Notes (Addendum)
Spinal  Patient location during procedure: OR Start time: 04/26/2022 12:52 PM End time: 04/26/2022 12:57 PM Reason for block: surgical anesthesia Staffing Performed: anesthesiologist  Anesthesiologist: Brennan Bailey, MD Performed by: Brennan Bailey, MD Authorized by: Brennan Bailey, MD   Preanesthetic Checklist Completed: patient identified, IV checked, risks and benefits discussed, surgical consent, monitors and equipment checked, pre-op evaluation and timeout performed Spinal Block Patient position: sitting Prep: DuraPrep and site prepped and draped Patient monitoring: continuous pulse ox, blood pressure and heart rate Approach: midline Location: L3-4 Injection technique: single-shot Needle Needle type: Whitacre  Needle gauge: 22 G Needle length: 12.7 cm Assessment Events: CSF return Additional Notes Risks, benefits, and alternative discussed. Patient gave consent to procedure. Prepped and draped in sitting position. Patient sedated but responsive to voice. First pass with Pencan 24g 9cm needle unable to access intrathecal space. Next attempt at same location with Whitacre 22g 12.7cm with clear CSF obtained. Positive terminal aspiration. No pain or paraesthesias with injection. Patient tolerated procedure well. Vital signs stable. Tawny Asal, MD

## 2022-04-26 NOTE — Anesthesia Procedure Notes (Signed)
Procedure Name: MAC Date/Time: 04/26/2022 12:45 PM  Performed by: Michele Rockers, CRNAPre-anesthesia Checklist: Patient identified, Emergency Drugs available, Suction available, Timeout performed and Patient being monitored Patient Re-evaluated:Patient Re-evaluated prior to induction Oxygen Delivery Method: Nasal Cannula

## 2022-04-26 NOTE — Interval H&P Note (Signed)
History and Physical Interval Note:  04/26/2022 12:10 PM  Jerome Sims  has presented today for surgery, with the diagnosis of right knee osteoarthritis.  The various methods of treatment have been discussed with the patient and family. After consideration of risks, benefits and other options for treatment, the patient has consented to  Procedure(s) with comments: RIGHT TOTAL KNEE ARTHROPLASTY (Right) - RNFA REQUESTED as a surgical intervention.  The patient's history has been reviewed, patient examined, no change in status, stable for surgery.  I have reviewed the patient's chart and labs.  Questions were answered to the patient's satisfaction.     Marybelle Killings

## 2022-04-26 NOTE — Transfer of Care (Signed)
Immediate Anesthesia Transfer of Care Note  Patient: PRATT BRESS  Procedure(s) Performed: RIGHT TOTAL KNEE ARTHROPLASTY (Right: Knee)  Patient Location: PACU  Anesthesia Type:MAC, Regional, and Spinal  Level of Consciousness: drowsy and patient cooperative  Airway & Oxygen Therapy: Patient Spontanous Breathing  Post-op Assessment: Report given to RN and Post -op Vital signs reviewed and stable  Post vital signs: Reviewed and stable  Last Vitals:  Vitals Value Taken Time  BP 93/65 04/26/22 1508  Temp    Pulse 51 04/26/22 1512  Resp 14 04/26/22 1512  SpO2 97 % 04/26/22 1512  Vitals shown include unvalidated device data.  Last Pain:  Vitals:   04/26/22 1040  TempSrc:   PainSc: 3          Complications: No notable events documented.

## 2022-04-26 NOTE — Progress Notes (Signed)
Orthopedic Tech Progress Note Patient Details:  Jerome Sims [birth year] [id number]  CPM Right Knee CPM Right Knee: On Right Knee Flexion (Degrees): 0 Right Knee Extension (Degrees): 60  Post Interventions Patient Tolerated: Well  Jerome Sims A Arlette Schaad 04/26/2022, 6:08 PM

## 2022-04-26 NOTE — Anesthesia Postprocedure Evaluation (Signed)
Anesthesia Post Note  Patient: Jerome Sims  Procedure(s) Performed: RIGHT TOTAL KNEE ARTHROPLASTY (Right: Knee)     Patient location during evaluation: PACU Anesthesia Type: Spinal Level of consciousness: awake and alert Pain management: pain level controlled Vital Signs Assessment: post-procedure vital signs reviewed and stable Respiratory status: spontaneous breathing, nonlabored ventilation and respiratory function stable Cardiovascular status: blood pressure returned to baseline Postop Assessment: no apparent nausea or vomiting, spinal receding and no backache Anesthetic complications: no Comments: Patient c/o headache in PACU. He describes it as involving his "whole head". No photophobia, tinnitus, neck pain. I put him in supine and semi-fowlers positions and the pain did not change. Spinal headache unlikely. He normally has 2-3 caffeinated sodas per day and has had 2 diet cokes in PACU. He has received tylenol 1g IV which did not help. Ketorolac '15mg'$  IV and 500cc IVF bolus ordered. Daiva Huge, MD   No notable events documented.  Last Vitals:  Vitals:   04/26/22 1523 04/26/22 1538  BP: 97/63 (!) 94/57  Pulse: (!) 59 64  Resp: 15 20  Temp:    SpO2: 99% 100%    Last Pain:  Vitals:   04/26/22 1538  TempSrc:   PainSc: 0-No pain                 Marthenia Rolling

## 2022-04-26 NOTE — Anesthesia Procedure Notes (Signed)
Anesthesia Regional Block: Adductor canal block   Pre-Anesthetic Checklist: , timeout performed,  Correct Patient, Correct Site, Correct Laterality,  Correct Procedure, Correct Position, site marked,  Risks and benefits discussed,  Pre-op evaluation,  At surgeon's request and post-op pain management  Laterality: Right  Prep: Maximum Sterile Barrier Precautions used, chloraprep       Needles:  Injection technique: Single-shot  Needle Type: Echogenic Stimulator Needle     Needle Length: 9cm  Needle Gauge: 22     Additional Needles:   Procedures:,,,, ultrasound used (permanent image in chart),,    Narrative:  Start time: 04/26/2022 12:20 PM End time: 04/26/2022 12:23 PM Injection made incrementally with aspirations every 5 mL.  Performed by: Personally  Anesthesiologist: Brennan Bailey, MD  Additional Notes: Risks, benefits, and alternative discussed. Patient gave consent for procedure. Patient prepped and draped in sterile fashion. Sedation administered, patient remains easily responsive to voice. Relevant anatomy identified with ultrasound guidance. Local anesthetic given in 5cc increments with no signs or symptoms of intravascular injection. No pain or paraesthesias with injection. Patient monitored throughout procedure with signs of LAST or immediate complications. Tolerated well. Ultrasound image placed in chart.  Tawny Asal, MD

## 2022-04-27 ENCOUNTER — Encounter (HOSPITAL_COMMUNITY): Payer: Self-pay | Admitting: Orthopaedic Surgery

## 2022-04-27 DIAGNOSIS — M1711 Unilateral primary osteoarthritis, right knee: Secondary | ICD-10-CM | POA: Diagnosis not present

## 2022-04-27 LAB — GLUCOSE, CAPILLARY
Glucose-Capillary: 171 mg/dL — ABNORMAL HIGH (ref 70–99)
Glucose-Capillary: 157 mg/dL — ABNORMAL HIGH (ref 70–99)
Glucose-Capillary: 142 mg/dL — ABNORMAL HIGH (ref 70–99)
Glucose-Capillary: 163 mg/dL — ABNORMAL HIGH (ref 70–99)

## 2022-04-27 NOTE — Progress Notes (Signed)
Patient ID: Jerome Sims, male   DOB: [birth year], 67 y.o.   MRN: [id number]   Subjective: 1 Day Post-Op Procedure(s) (LRB): RIGHT TOTAL KNEE ARTHROPLASTY (Right) Patient reports pain as mild.    Objective: Vital signs in last 24 hours: Temp:  [97.5 F (36.4 C)-98.5 F (36.9 C)] 98.5 F (36.9 C) (01/16 0016) Pulse Rate:  [55-72] 63 (01/15 2034) Resp:  [11-20] 18 (01/16 0016) BP: (93-144)/(48-110) 136/59 (01/16 0016) SpO2:  [94 %-100 %] 98 % (01/16 0016) Weight:  [139.7 kg] 139.7 kg (01/15 1024)  Intake/Output from previous day: 01/15 0701 - 01/16 0700 In: 2481.5 [I.V.:1931.5; IV Piggyback:150] Out: 1300 [Urine:1250; Blood:50] Intake/Output this shift: No intake/output data recorded.  No results for input(s): "HGB" in the last 72 hours. No results for input(s): "WBC", "RBC", "HCT", "PLT" in the last 72 hours. No results for input(s): "NA", "K", "CL", "CO2", "BUN", "CREATININE", "GLUCOSE", "CALCIUM" in the last 72 hours. No results for input(s): "LABPT", "INR" in the last 72 hours.  Neurologically intact No results found.  Assessment/Plan: 1 Day Post-Op Procedure(s) (LRB): RIGHT TOTAL KNEE ARTHROPLASTY (Right) Up with therapy  Marybelle Killings 04/27/2022, 7:47 AM

## 2022-04-27 NOTE — Progress Notes (Signed)
Patient ID: Jerome Sims, male   DOB: Aug 12, 1955, 67 y.o.   MRN: [id number] Patient walked 30 feet with therapy.  Significant increased pain with therapy.  IV infiltrated he is requesting a restart by the RN so that he can get some IV pain medication before therapy comes back to works with him this afternoon.  Likely home Wednesday after he meets all therapy goals.

## 2022-04-27 NOTE — TOC Initial Note (Addendum)
Transition of Care [facility]) - Initial/Assessment Note    Patient Details  Name: Jerome Sims MRN: [id number] Date of Birth: Sep 13, 1955  Transition of [facility] CM/SW Contact:    Sharin Mons, RN Phone Number: 04/27/2022, 8:33 AM  Clinical Narrative:                   - s/p R TKA, 1/15 From home alone. PTA independent with ADL's , no DME usage. States has 2 steps to entry into home. States plans to transition to sister Karen's [phone]) residence to recover once d/c. Sister  lives in [place]. [place] to f/u with sister's address, pt doesn't have available.  PT evaluation pending....  TOC team following and will assist with needs.  04/28/2022 @ 0830 Pt will d/c to sister's residence: [address] [place], [address]. Orders noted for home health PT services. Pt agreeable. Referral made with [facility] , acceptance pending. 04/28/2022  [facility] services, pt made aware.  Expected Discharge Plan: Home/Self Care (vs home with home health services) Barriers to Discharge: Continued Medical Work up   Patient Goals and CMS Choice            Expected Discharge Plan and Services   Discharge Planning Services: CM Consult   Living arrangements for the past 2 months: [place]: PT          Prior Living Arrangements/Services Living arrangements for the past 2 months: [place] with:: Self Patient language and need for interpreter reviewed:: Yes Do you feel safe going back to the place where you live?: Yes      Need for Family Participation in Patient Care: Yes (Comment) Care giver support system in place?: Yes (comment)   Criminal Activity/Legal Involvement Pertinent to Current Situation/Hospitalization: No - Comment as needed  Activities of Daily Living      Permission Sought/Granted   Permission granted to share information with : Yes,  Verbal Permission Granted              Emotional Assessment Appearance:: Appears stated age Attitude/Demeanor/Rapport: Engaged Affect (typically observed): (S) Accepting Orientation: : Oriented to Self, Oriented to Place, Oriented to  Time, Oriented to Situation Alcohol / Substance Use: Not Applicable Psych Involvement: No (comment)  Admission diagnosis:  S/P total knee arthroplasty [Z96.659] Patient Active Problem List   Diagnosis Date Noted   S/P total knee arthroplasty 04/26/2022   Essential hypertension 02/21/2022   Hyperlipidemia associated with type 2 diabetes mellitus ([place]) 02/21/2022   Preoperative cardiovascular examination 02/21/2022   Unilateral primary osteoarthritis, right knee 02/14/2022   S/P cervical spinal fusion 07/15/2017   Type 2 diabetes mellitus without complication, without long-term current use of insulin ([place]) 05/31/2017   Left hip pain 03/24/2017   Carpal tunnel syndrome of left wrist 12/15/2016   Kidney stones 09/23/2016   Heavy smoker (more than 20 cigarettes per day) 09/21/2016   Sun-damaged skin 09/21/2016   Morbid obesity with BMI of 45.0-49.9, adult ([place]) 09/20/2016   OSA on CPAP 09/20/2016   Other emphysema ([place]) 09/20/2016   PCP:  Dorothyann Peng, NP Pharmacy:   [place], [place] [address] Phone: [phone] Fax: [phone]  [facility], [place] - [address] [place] [address] Phone: [phone] Fax: [phone]     Social Determinants of Health (SDOH) Social History: SDOH Screenings   Tobacco Use: Medium Risk (04/26/2022)   SDOH Interventions:     Readmission Risk Interventions     No data to display

## 2022-04-27 NOTE — Evaluation (Signed)
Physical Therapy Evaluation Patient Details Name: Jerome Sims MRN: [id number] DOB: Feb 17, 1956 Today's Date: 04/27/2022  History of Present Illness  Pt is 67 yo make who presents on 04/26/22 for R TKA. PMH: OA, HTN, HLD, DM2, ACDF, COPD, morbid obesity, OSA.  Clinical Impression  Pt admitted with above diagnosis. Pt from home alone but is planning to go to his sister's house and his brother in law will be with him 24/7. 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 Pt needing min guard A for mobility today. Ambulated 74' with RW and KI, no buckling of R knee felt within [facility]. Expect he will be safe to d/c home with HHPT.  Pt currently with functional limitations due to the deficits listed below (see PT Problem List). Pt will benefit from skilled PT to increase their independence and safety with mobility to allow discharge to the venue listed below.          Recommendations for follow up therapy are one component of a multi-disciplinary discharge planning process, led by the attending physician.  Recommendations may be updated based on patient status, additional functional criteria and insurance authorization.  Follow Up Recommendations Follow physician's recommendations for discharge plan and follow up therapies      Assistance Recommended at Discharge Intermittent Supervision/Assistance  Patient can return home with the following  A little help with walking and/or transfers;Assistance with cooking/housework;Assist for transportation    Equipment Recommendations Rolling walker (2 wheels);BSC/3in1 (bariatric)  Recommendations for Other Services       Functional Status Assessment Patient has had a recent decline in their functional status and demonstrates the ability to make significant improvements in function in a reasonable and predictable amount of time.     Precautions / Restrictions Precautions Precautions: Fall;Knee Precaution Booklet Issued: Yes  (comment) Precaution Comments: reviewed proper positioning, elevation and exercises as well as CPM use Required Braces or Orthoses: Knee Immobilizer - Right Knee Immobilizer - Right: On when out of bed or walking Restrictions Weight Bearing Restrictions: Yes RLE Weight Bearing: Weight bearing as tolerated      Mobility  Bed Mobility Overal bed mobility: Needs Assistance Bed Mobility: Sidelying to Sit   Sidelying to sit: Min guard       General bed mobility comments: pt uses momentum to come to EOB from flat. No physical assist needed but closely guarded for safety as he got to EOB    Transfers Overall transfer level: Needs assistance Equipment used: Rolling walker (2 wheels) Transfers: Sit to/from Stand Sit to Stand: Min guard           General transfer comment: min guard for safety, vc's for hand placement    Ambulation/Gait Ambulation/Gait assistance: Min guard Gait Distance (Feet): 30 Feet Assistive device: Rolling walker (2 wheels) Gait Pattern/deviations: Step-through pattern, Decreased weight shift to right Gait velocity: decreased Gait velocity interpretation: <1.8 ft/sec, indicate of risk for recurrent falls   General Gait Details: pt ambulated with KI, no buckling felt with tranference of wt to RLE. Pt also relying heavily on bariatric RW.  Stairs            Wheelchair Mobility    Modified Rankin (Stroke Patients Only)       Balance Overall balance assessment: Mild deficits observed, not formally tested  Pertinent Vitals/Pain Pain Assessment Pain Assessment: 0-10 Pain Score: 6  Pain Location: R knee Pain Descriptors / Indicators: Aching Pain Intervention(s): Limited activity within patient's tolerance, Monitored during session, Patient requesting pain meds-RN notified    Home Living Family/patient expects to be discharged to:: Private residence Living Arrangements:  Alone Available Help at Discharge: Family;Available PRN/intermittently Type of Home: House Home Access: Level entry       Home Layout: One level Home Equipment: Conservation officer, nature (2 wheels) Additional Comments: pt will go to sister's house initially since he lives alone. Brother in law will be with him 24/7. Home info above is for her house.    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       [place]: Right    Extremity/Trunk Assessment   Upper Extremity Assessment Upper Extremity Assessment: Overall WFL for tasks assessed    Lower Extremity Assessment Lower Extremity Assessment: RLE deficits/detail RLE Deficits / Details: hip flex 3-/5, knee ext 3/5 RLE Sensation: WNL RLE Coordination: WNL    Cervical / Trunk Assessment Cervical / Trunk Assessment: Other exceptions Cervical / Trunk Exceptions: morbid obesity and past h/o cx surgery  Communication   Communication: No difficulties  Cognition Arousal/Alertness: Awake/alert Behavior During Therapy: WFL for tasks assessed/performed, Impulsive Overall Cognitive Status: Within Functional Limits for tasks assessed                                          General Comments General comments (skin integrity, edema, etc.): VSS    Exercises Total Joint Exercises Ankle Circles/Pumps: AROM, Both, 10 reps, Supine Quad Sets: AROM, Both, 10 reps, Supine, Seated Gluteal Sets: AROM, Both, 10 reps, Supine, Seated   Assessment/Plan    PT Assessment Patient needs continued PT services  PT Problem List Decreased strength;Decreased range of motion;Decreased activity tolerance;Decreased mobility;Decreased knowledge of use of DME;Decreased knowledge of precautions;Pain;Obesity       PT Treatment Interventions DME instruction;Gait training;Stair  training;Functional mobility training;Therapeutic activities;Therapeutic exercise;Balance training;Neuromuscular re-education;Patient/family education    PT Goals (Current goals can be found in the Care Plan section)  Acute Rehab PT Goals Patient Stated Goal: return home PT Goal Formulation: With patient Time For Goal Achievement: 05/04/22 Potential to Achieve Goals: Good    Frequency 7X/week     Co-evaluation               AM-PAC PT "6 Clicks" Mobility  Outcome Measure Help needed turning from your back to your side while in a flat bed without using bedrails?: None Help needed moving from lying on your back to sitting on the side of a flat bed without using bedrails?: None Help needed moving to and from a bed to a chair (including a wheelchair)?: A Little Help needed standing up from a chair using your arms (e.g., wheelchair or bedside chair)?: A Little Help needed to walk in hospital room?: A Little Help needed climbing 3-5 steps with a railing? : A Little 6 Click Score: 20    End of Session Equipment Utilized During Treatment: Gait belt;Right knee immobilizer Activity Tolerance: Patient tolerated treatment well Patient left: in chair;with call bell/phone within reach;with chair alarm set Nurse Communication: Mobility status PT Visit Diagnosis: Difficulty  in walking, not elsewhere classified (R26.2);Pain Pain - Right/Left: Right Pain - part of body: Knee    Time: 0093-8182 PT Time Calculation (min) (ACUTE ONLY): 30 min   Charges:   PT Evaluation $PT Eval Moderate Complexity: 1 Mod PT Treatments $Gait Training: 8-22 mins        Leighton Roach, PT  Acute Rehab Services Secure chat preferred Office [place] 04/27/2022, 12:05 PM

## 2022-04-27 NOTE — Progress Notes (Signed)
Physical Therapy Treatment Patient Details Name: Jerome Sims MRN: [id number] DOB: 23-Aug-1955 Today's Date: 04/27/2022   History of Present Illness Pt is 67 yo make who presents on 04/26/22 for R TKA. PMH: OA, HTN, HLD, DM2, ACDF, COPD, morbid obesity, OSA.    PT Comments    Pt received in CPM. Assisted out of CPM and tolerated supine and seated there ex well including knee flexion stretch. Current R knee ROM 5-76 degrees. Pt ambulated 70' with RW and min guard A. No significant increase in pain after ambulation. Did note quarter size amount of drainage through ACE wrap at superior end of incision, RN notified. Pt returned to CPM to finish time. Progressing well, expect will be ready for d/c tomorrow. PT will continue to follow.     Recommendations for follow up therapy are one component of a multi-disciplinary discharge planning process, led by the attending physician.  Recommendations may be updated based on patient status, additional functional criteria and insurance authorization.  Follow Up Recommendations  Follow physician's recommendations for discharge plan and follow up therapies     Assistance Recommended at Discharge Intermittent Supervision/Assistance  Patient can return home with the following A little help with walking and/or transfers;Assistance with cooking/housework;Assist for transportation   Equipment Recommendations  Rolling walker (2 wheels);BSC/3in1 (bariatric)    Recommendations for Other Services       Precautions / Restrictions Precautions Precautions: Fall;Knee Precaution Booklet Issued: Yes (comment) Precaution Comments: reviewed proper positioning, elevation and exercises as well as CPM use Required Braces or Orthoses: Knee Immobilizer - Right Knee Immobilizer - Right: On when out of bed or walking Restrictions Weight Bearing Restrictions: Yes RLE Weight Bearing: Weight bearing as tolerated     Mobility  Bed Mobility Overal bed mobility: Needs  Assistance Bed Mobility: Supine to Sit   Sidelying to sit: Min guard Supine to sit: Supervision     General bed mobility comments: pt uses momentum to come to EOB from flat. No physical assist needed but closely guarded for safety as he got to EOB    Transfers Overall transfer level: Needs assistance Equipment used: Rolling walker (2 wheels) Transfers: Sit to/from Stand Sit to Stand: Min guard           General transfer comment: min guard    Ambulation/Gait Ambulation/Gait assistance: Min guard Gait Distance (Feet): 80 Feet Assistive device: Rolling walker (2 wheels) Gait Pattern/deviations: Step-through pattern, Decreased weight shift to right Gait velocity: decreased Gait velocity interpretation: <1.8 ft/sec, indicate of risk for recurrent falls   General Gait Details: KI donned for ambulation. Min guard for safety, pt mildly lightheaded end of ambulation. Subsided with sitting.   Stairs             Wheelchair Mobility    Modified Rankin (Stroke Patients Only)       Balance Overall balance assessment: Mild deficits observed, not formally tested                                          Cognition Arousal/Alertness: Awake/alert Behavior During Therapy: WFL for tasks assessed/performed, Impulsive Overall Cognitive Status: Within Functional Limits for tasks assessed                                          Exercises  Total Joint Exercises Ankle Circles/Pumps: AROM, Both, 10 reps, Supine Quad Sets: AROM, Both, 10 reps, Supine, Seated Gluteal Sets: AROM, Both, 10 reps, Supine, Seated Heel Slides: AROM, Right, 10 reps Straight Leg Raises: AROM, Right, 10 reps, Supine Long Arc Quad: AROM, Right, 10 reps, Seated Knee Flexion: Right, AAROM (30 sec stretch) Goniometric ROM: 5-76    General Comments General comments (skin integrity, edema, etc.): VSS. Sister present beginning of session and left phone # for info and d/c  tomorrow      Pertinent Vitals/Pain Pain Assessment Pain Assessment: Faces Pain Score: 6  Faces Pain Scale: Hurts even more Pain Location: R knee Pain Descriptors / Indicators: Aching Pain Intervention(s): Limited activity within patient's tolerance, Monitored during session, Premedicated before session    Home Living                          Prior Function            PT Goals (current goals can now be found in the care plan section) Acute Rehab PT Goals Patient Stated Goal: return home PT Goal Formulation: With patient Time For Goal Achievement: 05/04/22 Potential to Achieve Goals: Good Progress towards PT goals: Progressing toward goals    Frequency    7X/week      PT Plan Current plan remains appropriate    Co-evaluation              AM-PAC PT "6 Clicks" Mobility   Outcome Measure  Help needed turning from your back to your side while in a flat bed without using bedrails?: None Help needed moving from lying on your back to sitting on the side of a flat bed without using bedrails?: None Help needed moving to and from a bed to a chair (including a wheelchair)?: A Little Help needed standing up from a chair using your arms (e.g., wheelchair or bedside chair)?: A Little Help needed to walk in hospital room?: A Little Help needed climbing 3-5 steps with a railing? : A Little 6 Click Score: 20    End of Session Equipment Utilized During Treatment: Gait belt;Right knee immobilizer Activity Tolerance: Patient tolerated treatment well Patient left: with call bell/phone within reach;in bed;in CPM;with bed alarm set Nurse Communication: Mobility status PT Visit Diagnosis: Difficulty in walking, not elsewhere classified (R26.2);Pain Pain - Right/Left: Right Pain - part of body: Knee     Time: 1534-1601 PT Time Calculation (min) (ACUTE ONLY): 27 min  Charges:  $Gait Training: 8-22 mins $Therapeutic Activity: 8-22 mins                      Leighton Roach, PT  Acute Rehab Services Secure chat preferred Office [place] 04/27/2022, 4:16 PM

## 2022-04-28 DIAGNOSIS — M1711 Unilateral primary osteoarthritis, right knee: Secondary | ICD-10-CM | POA: Diagnosis not present

## 2022-04-28 LAB — BASIC METABOLIC PANEL
Anion gap: 8 (ref 5–15)
BUN: 12 mg/dL (ref 8–23)
CO2: 26 mmol/L (ref 22–32)
Calcium: 8.2 mg/dL — ABNORMAL LOW (ref 8.9–10.3)
Chloride: 99 mmol/L (ref 98–111)
Creatinine, Ser: 1.28 mg/dL — ABNORMAL HIGH (ref 0.61–1.24)
GFR, Estimated: >60 mL/min (ref >60)
Glucose, Bld: 131 mg/dL — ABNORMAL HIGH (ref 70–99)
Potassium: 4.2 mmol/L (ref 3.5–5.1)
Sodium: 133 mmol/L — ABNORMAL LOW (ref 135–145)

## 2022-04-28 LAB — CBC
HCT: 33.5 % — ABNORMAL LOW (ref 39.0–52.0)
Hemoglobin: 11.2 g/dL — ABNORMAL LOW (ref 13.0–17.0)
MCH: 29.6 pg (ref 26.0–34.0)
MCHC: 33.4 g/dL (ref 30.0–36.0)
MCV: 88.6 fL (ref 80.0–100.0)
Platelets: 186 10*3/uL (ref 150–400)
RBC: 3.78 MIL/uL — ABNORMAL LOW (ref 4.22–5.81)
RDW: 13.3 % (ref 11.5–15.5)
WBC: 9.1 10*3/uL (ref 4.0–10.5)
nRBC: 0 % (ref 0.0–0.2)

## 2022-04-28 LAB — GLUCOSE, CAPILLARY
Glucose-Capillary: 156 mg/dL — ABNORMAL HIGH (ref 70–99)
Glucose-Capillary: 123 mg/dL — ABNORMAL HIGH (ref 70–99)
Glucose-Capillary: 131 mg/dL — ABNORMAL HIGH (ref 70–99)
Glucose-Capillary: 134 mg/dL — ABNORMAL HIGH (ref 70–99)

## 2022-04-28 MED ORDER — OXYCODONE-ACETAMINOPHEN 5-325 MG PO TABS: 1.0000 | ORAL_TABLET | Freq: Four times a day (QID) | ORAL | 0 refills | Status: DC | PRN

## 2022-04-28 MED ORDER — ASPIRIN 325 MG PO TABS: 325.0000 mg | ORAL_TABLET | Freq: Every day | ORAL | Status: DC

## 2022-04-28 NOTE — TOC Transition Note (Addendum)
Transition of Care [facility]) - CM/SW Discharge Note   Patient Details  Name: Jerome Sims MRN: [id number] Date of Birth: 01-25-1956  Transition of Care [facility]) CM/SW Contact:  Sharin Mons, RN Phone Number: 04/28/2022, 8:50 AM   Clinical Narrative:    Patient will DC to: home Anticipated DC date: 04/28/2022 Family notified: yes Transport by: car   Per MD patient ready for DC today after therapy with PT this am. RN, patient, patient's family, and [facility]  notified of DC.  Referral made with [employer] for bariatric RW and BSC. Equipment will be delivered to bedside prior to d/c.  Post hospital f/u noted on AVS. Pt without RX med concerns. Sister Santiago Glad to provide transportation to home.  RNCM will sign off for now as intervention is no longer needed. Please consult [place] again if new needs arise.     Final next level of care: [place] Barriers to Discharge: No Barriers Identified   Patient Goals and CMS Choice   Choice offered to / list presented to : Patient  Discharge Placement                         Discharge Plan and Services Additional resources added to the After Visit Summary for     Discharge Planning Services: CM Consult            DME Arranged: 3-N-1, Walker rolling   Date DME Agency Contacted: 04/28/22 Time DME Agency Contacted: [phone] Representative spoke with at DME Agency: [place]: PT [place]: [place] Date [place]: 04/28/22 Time [place]: [phone] Representative spoke with at [place]: [place] Determinants of Health ([place]) Interventions SDOH Screenings   Tobacco Use: Medium Risk (04/27/2022)     Readmission Risk Interventions     No data to display

## 2022-04-28 NOTE — Progress Notes (Addendum)
Physical Therapy Treatment Patient Details Name: Jerome Sims MRN: [id number] DOB: 26-Mar-1956 Today's Date: 04/28/2022   History of Present Illness Pt is 67 yo make who presents on 04/26/22 for R TKA. Episode of symptomatic orthostatic hypotension 1/17 AM PT session. PMH: OA, HTN, HLD, DM2, ACDF, COPD, morbid obesity, OSA.    PT Comments    Pt received in supine, agreeable to therapy session and with good participation and fair tolerance for transfer, gait and exercise instruction for RLE. Pt with some quad lag with SLR, unable to perform without AA, but some ability to perform SAQ, though difficulty achieving terminal knee extension due to lingering R quad weakness. Pt gait/standing tolerance limited due to symptomatic orthostatic hypotension during gait trial. He had SBP drop 35 points from supine to sitting (after gait trial). Plan to assess standing orthostatics in PM session and monitor for symptoms, pt will need at least 1-2 more sessions to ensure safe mobility progression prior to DC. Pt needs TED hose placed once ace wrap dressing removed, SCD machine malfunction, RN notified. Pt continues to benefit from PT services to progress toward functional mobility goals.     Recommendations for follow up therapy are one component of a multi-disciplinary discharge planning process, led by the attending physician.  Recommendations may be updated based on patient status, additional functional criteria and insurance authorization.  Follow Up Recommendations  Follow physician's recommendations for discharge plan and follow up therapies     Assistance Recommended at Discharge Intermittent Supervision/Assistance  Patient can return home with the following A little help with walking and/or transfers;Assistance with cooking/housework;Assist for transportation   Equipment Recommendations  Rolling walker (2 wheels);BSC/3in1 (bari; already delivered to room 1/17)    Recommendations for Other Services        Precautions / Restrictions Precautions Precautions: Fall;Knee Precaution Booklet Issued: Yes (comment) Precaution Comments: reviewed proper positioning, elevation and exercises as well as CPM use Required Braces or Orthoses: Knee Immobilizer - Right Knee Immobilizer - Right: On when out of bed or walking Restrictions Weight Bearing Restrictions: Yes RLE Weight Bearing: Weight bearing as tolerated     Mobility  Bed Mobility Overal bed mobility: Needs Assistance Bed Mobility: Supine to Sit, Sit to Supine     Supine to sit: Min guard Sit to supine: Min assist   General bed mobility comments: minA for RLE assist to elevate back into bed; pt instructed on using gt belt as leg lifter to sit up but did not need to use it with KI donned    Transfers Overall transfer level: Needs assistance Equipment used: Rolling walker (2 wheels) Transfers: Sit to/from Stand Sit to Stand: Min guard           General transfer comment: min guard, pt with mild nausea upon standing    Ambulation/Gait Ambulation/Gait assistance: Min guard Gait Distance (Feet): 45 Feet Assistive device: Rolling walker (2 wheels) Gait Pattern/deviations: Decreased weight shift to right, Step-to pattern, Decreased stance time - right, [place], Trunk flexed, Decreased step length - left Gait velocity: decreased Gait velocity interpretation: <1.31 ft/sec, indicative of household ambulator   General Gait Details: KI donned for ambulation. Nausea/lightheadedness increasing with distance, pt reports moderate to severe pain but very lightheaded. Pt becoming diaphoretic toward end of trial. Returned to room prior to going intended distance due to pt symptoms.   Stairs Stairs:  (NT; pt has a ramp at his sister's home)           Wheelchair Mobility  Modified Rankin (Stroke Patients Only)       Balance Overall balance assessment: Mild deficits observed, not formally tested                                           Cognition Arousal/Alertness: Awake/alert Behavior During Therapy: WFL for tasks assessed/performed, Impulsive Overall Cognitive Status: Within Functional Limits for tasks assessed                                 General Comments: Pleasant, cooperative; needs some repetition of cues at times, internally distracted likely due to pain/nausea/lightheadedness        Exercises Total Joint Exercises Ankle Circles/Pumps: AROM, Both, 10 reps, Supine Heel Slides: AROM, Right, 10 reps Straight Leg Raises: AROM, Right, 10 reps, Supine Long Arc Quad: AROM, Right, 10 reps, Seated Knee Flexion: Right, AAROM (30 sec stretch)    General Comments General comments (skin integrity, edema, etc.): Pt orthostatic, BP 90/67 (76) sitting EOB and BP 125/60 (76) supine post-exertion, nausea/dizziness; HR 71-78 bpm post-exertion (not reading well on portable sensor while using RW) and SpO2 WFL on RA      Pertinent Vitals/Pain Pain Assessment Pain Assessment: Faces Faces Pain Scale: Hurts whole lot Pain Location: R knee with AAROM and gait Pain Descriptors / Indicators: Aching, Throbbing, Sharp, Discomfort, Grimacing, Moaning Pain Intervention(s): Limited activity within patient's tolerance, Monitored during session, Premedicated before session, Repositioned, Ice applied, Other (comment) (pt c/o nausea, lightheadedness with ambulation, diaphoretic)     PT Goals (current goals can now be found in the care plan section) Acute Rehab PT Goals Patient Stated Goal: return home PT Goal Formulation: With patient Time For Goal Achievement: 05/04/22 Progress towards PT goals: Progressing toward goals    Frequency    7X/week      PT Plan Current plan remains appropriate       AM-PAC PT "6 Clicks" Mobility   Outcome Measure  Help needed turning from your back to your side while in a flat bed without using bedrails?: None Help needed moving from lying on  your back to sitting on the side of a flat bed without using bedrails?: A Little (without rails; HOB flat) Help needed moving to and from a bed to a chair (including a wheelchair)?: A Little Help needed standing up from a chair using your arms (e.g., wheelchair or bedside chair)?: A Little Help needed to walk in hospital room?: A Little Help needed climbing 3-5 steps with a railing? : Total 6 Click Score: 17    End of Session Equipment Utilized During Treatment: Gait belt;Right knee immobilizer Activity Tolerance: Patient tolerated treatment well;Treatment limited secondary to medical complications (Comment);Other (comment) (symptomatic orthostatic hypotension) Patient left: with call bell/phone within reach;in bed;with bed alarm set;Other (comment) (pt icing/elevating RLE prior to CPM placement; also got him a cup of ice water as per RN his urine appeared dark) Nurse Communication: Mobility status;Other (comment);Precautions (orthostatic BP; pt symptoms/nausea; SCD machine malfunction, pt needs TED hose placed prior to PM session) PT Visit Diagnosis: Difficulty in walking, not elsewhere classified (R26.2);Pain Pain - Right/Left: Right Pain - part of body: Knee     Time: 1208-1254 PT Time Calculation (min) (ACUTE ONLY): 46 min  Charges:  $Gait Training: 8-22 mins $Therapeutic Exercise: 8-22 mins $Therapeutic Activity: 8-22 mins  Houston Siren., PTA Acute Rehabilitation Services Secure Chat Preferred 9a-5:30pm Office: [place] 04/28/2022, 1:37 PM

## 2022-04-28 NOTE — Discharge Instructions (Signed)
Dental Antibiotics:  In most cases prophylactic antibiotics for Dental procdeures after total joint surgery are not necessary.  Exceptions are as follows:  1. History of prior total joint infection  2. Severely immunocompromised (Organ Transplant, cancer chemotherapy, Rheumatoid biologic meds such as Foley)  3. Poorly controlled diabetes (A1C &gt; 8.0, blood glucose over 200)  If you have one of these conditions, contact your surgeon for an antibiotic prescription, prior to your dental procedure. INSTRUCTIONS AFTER JOINT REPLACEMENT   Remove items at home which could result in a fall. This includes throw rugs or furniture in walking pathways ICE to the affected joint every three hours while awake for 30 minutes at a time, for at least the first 3-5 days, and then as needed for pain and swelling.  Continue to use ice for pain and swelling. You may notice swelling that will progress down to the foot and ankle.  This is normal after surgery.  Elevate your leg when you are not up walking on it.   Continue to use the breathing machine you got in the hospital (incentive spirometer) which will help keep your temperature down.  It is common for your temperature to cycle up and down following surgery, especially at night when you are not up moving around and exerting yourself.  The breathing machine keeps your lungs expanded and your temperature down.   DIET:  As you were doing prior to hospitalization, we recommend a well-balanced diet.  DRESSING / WOUND CARE / SHOWERING  Keep the surgical dressing until follow up.  The dressing is water proof, so you can shower without any extra covering.  IF THE DRESSING FALLS OFF or the wound gets wet inside, change the dressing with sterile gauze.  Please use good hand washing techniques before changing the dressing.  Do not use any lotions or creams on the incision until instructed by your surgeon.    ACTIVITY  Increase activity slowly as tolerated, but  follow the weight bearing instructions below.   No driving for 6 weeks or until further direction given by your physician.  You cannot drive while taking narcotics.  No lifting or carrying greater than 10 lbs. until further directed by your surgeon. Avoid periods of inactivity such as sitting longer than an hour when not asleep. This helps prevent blood clots.  You may return to work once you are authorized by your doctor.     WEIGHT BEARING   Weight bearing as tolerated with assist device (walker, cane, etc) as directed, use it as long as suggested by your surgeon or therapist, typically at least 4-6 weeks.   EXERCISES  Results after joint replacement surgery are often greatly improved when you follow the exercise, range of motion and muscle strengthening exercises prescribed by your doctor. Safety measures are also important to protect the joint from further injury. Any time any of these exercises cause you to have increased pain or swelling, decrease what you are doing until you are comfortable again and then slowly increase them. If you have problems or questions, call your caregiver or physical therapist for advice.   Rehabilitation is important following a joint replacement. After just a few days of immobilization, the muscles of the leg can become weakened and shrink (atrophy).  These exercises are designed to build up the tone and strength of the thigh and leg muscles and to improve motion. Often times heat used for twenty to thirty minutes before working out will loosen up your tissues and help with  improving the range of motion but do not use heat for the first two weeks following surgery (sometimes heat can increase post-operative swelling).   These exercises can be done on a training (exercise) mat, on the floor, on a table or on a bed. Use whatever works the best and is most comfortable for you.    Use music or television while you are exercising so that the exercises are a pleasant  break in your day. This will make your life better with the exercises acting as a break in your routine that you can look forward to.   Perform all exercises about fifteen times, three times per day or as directed.  You should exercise both the operative leg and the other leg as well.  Exercises include:   Quad Sets - Tighten up the muscle on the front of the thigh (Quad) and hold for 5-10 seconds.   Straight Leg Raises - With your knee straight (if you were given a brace, keep it on), lift the leg to 60 degrees, hold for 3 seconds, and slowly lower the leg.  Perform this exercise against resistance later as your leg gets stronger.  Leg Slides: Lying on your back, slowly slide your foot toward your buttocks, bending your knee up off the floor (only go as far as is comfortable). Then slowly slide your foot back down until your leg is flat on the floor again.  Angel Wings: Lying on your back spread your legs to the side as far apart as you can without causing discomfort.  Hamstring Strength:  Lying on your back, push your heel against the floor with your leg straight by tightening up the muscles of your buttocks.  Repeat, but this time bend your knee to a comfortable angle, and push your heel against the floor.  You may put a pillow under the heel to make it more comfortable if necessary.   A rehabilitation program following joint replacement surgery can speed recovery and prevent re-injury in the future due to weakened muscles. Contact your doctor or a physical therapist for more information on knee rehabilitation.    CONSTIPATION  Constipation is defined medically as fewer than three stools per week and severe constipation as less than one stool per week.  Even if you have a regular bowel pattern at home, your normal regimen is likely to be disrupted due to multiple reasons following surgery.  Combination of anesthesia, postoperative narcotics, change in appetite and fluid intake all can affect your  bowels.   YOU MUST use at least one of the following options; they are listed in order of increasing strength to get the job done.  They are all available over the counter, and you may need to use some, POSSIBLY even all of these options:    Drink plenty of fluids (prune juice may be helpful) and high fiber foods Colace 100 mg by mouth twice a day  Senokot for constipation as directed and as needed Dulcolax (bisacodyl), take with full glass of water  Miralax (polyethylene glycol) once or twice a day as needed.  If you have tried all these things and are unable to have a bowel movement in the first 3-4 days after surgery call either your surgeon or your primary doctor.    If you experience loose stools or diarrhea, hold the medications until you stool forms back up.  If your symptoms do not get better within 1 week or if they get worse, check with your doctor.  If you experience "the worst abdominal pain ever" or develop nausea or vomiting, please contact the office immediately for further recommendations for treatment.   ITCHING:  If you experience itching with your medications, try taking only a single pain pill, or even half a pain pill at a time.  You can also use Benadryl over the counter for itching or also to help with sleep.   TED HOSE STOCKINGS:  Use stockings on both legs until for at least 2 weeks or as directed by physician office. They may be removed at night for sleeping.  MEDICATIONS:  See your medication summary on the "After Visit Summary" that nursing will review with you.  You may have some home medications which will be placed on hold until you complete the course of blood thinner medication.  It is important for you to complete the blood thinner medication as prescribed.  PRECAUTIONS:  If you experience chest pain or shortness of breath - call 911 immediately for transfer to the hospital emergency department.   If you develop a fever greater that 101 F, purulent drainage  from wound, increased redness or drainage from wound, foul odor from the wound/dressing, or calf pain - CONTACT YOUR SURGEON.                                                   FOLLOW-UP APPOINTMENTS:  If you do not already have a post-op appointment, please call the office for an appointment to be seen by your surgeon.  Guidelines for how soon to be seen are listed in your "After Visit Summary", but are typically between 1-4 weeks after surgery.  OTHER INSTRUCTIONS:   Knee Replacement:  Do not place pillow under knee, focus on keeping the knee straight while resting. CPM instructions: 0-90 degrees, 2 hours in the morning, 2 hours in the afternoon, and 2 hours in the evening. Place foam block, curve side up under heel at all times except when in CPM or when walking.  DO NOT modify, tear, cut, or change the foam block in any way.  POST-OPERATIVE OPIOID TAPER INSTRUCTIONS: It is important to wean off of your opioid medication as soon as possible. If you do not need pain medication after your surgery it is ok to stop day one. Opioids include: Codeine, Hydrocodone(Norco, Vicodin), Oxycodone(Percocet, oxycontin) and hydromorphone amongst others.  Long term and even short term use of opiods can cause: Increased pain response Dependence Constipation Depression Respiratory depression And more.  Withdrawal symptoms can include Flu like symptoms Nausea, vomiting And more Techniques to manage these symptoms Hydrate well Eat regular healthy meals Stay active Use relaxation techniques(deep breathing, meditating, yoga) Do Not substitute Alcohol to help with tapering If you have been on opioids for less than two weeks and do not have pain than it is ok to stop all together.  Plan to wean off of opioids This plan should start within one week post op of your joint replacement. Maintain the same interval or time between taking each dose and first decrease the dose.  Cut the total daily intake of  opioids by one tablet each day Next start to increase the time between doses. The last dose that should be eliminated is the evening dose.   MAKE SURE YOU:  Understand these instructions.  Get help right away if you are not doing  well or get worse.    Thank you for letting us be a part of your medical care team.  It is a privilege we respect greatly.  We hope these instructions will help you stay on track for a fast and full recovery!

## 2022-04-28 NOTE — Progress Notes (Cosign Needed)
Durable Medical Equipment  (From admission, onward)           Start     Ordered   04/28/22 0846  For home use only DME 3 n 1  Once       Comments: Bariatric BSC. Confined to one room.   04/28/22 0847   04/28/22 0845  For home use only DME Walker rolling  Once       Comments: Bariatric RW  Question Answer Comment  Walker: With 5 Inch Wheels   Patient needs a walker to treat with the following condition Gait instability      04/28/22 0847

## 2022-04-28 NOTE — Progress Notes (Signed)
Physical Therapy Treatment Patient Details Name: Jerome Sims MRN: [id number] DOB: 10-19-1955 Today's Date: 04/28/2022   History of Present Illness Pt is 67 yo make who presents on 04/26/22 for R TKA. Episode of symptomatic orthostatic hypotension 1/17 AM PT session. PMH: OA, HTN, HLD, DM2, ACDF, COPD, morbid obesity, OSA.    PT Comments    Pt received in supine after using CPM, bilat knee-high TED hose in place, pt agreeable to therapy session and reporting only mild RLE pain at rest. Pt with improved RLE quad contraction during PM HEP but not yet able to perform SLR without quad lag or AAROM, PTA kept KI on for OOB transfers/gait. Pt needing min guard for sit<>stand and to minA for bed mobility and gait due to evolving symptoms of lightheadedness with standing/gait at bedside. Pt unable to progress to longer household distance gait trial due to symptomatic orthostatic hypotension, RN notified. RN called to room and gave tylenol during session, ice packs placed for comfort at end of session, RLE elevated in supine. Pt encouraged to order dinner and continue hydrating, water cup refilled. Pt continues to benefit from PT services to progress toward functional mobility goals.  Orthostatic BPs Supine 138/62 (84); HR 77 bpm  Sitting (EOB) 147/90 (108) HR 82 bpm "a little dizzy"  Standing 132/78 HR 97 bpm, "a little more lightheaded"  Standing after 5 min 121/79 (94) HR 97 "very lightheaded"  Supine post-exertion 141/67 (89) HR 86 bpm, "I feel better now"     Recommendations for follow up therapy are one component of a multi-disciplinary discharge planning process, led by the attending physician.  Recommendations may be updated based on patient status, additional functional criteria and insurance authorization.  Follow Up Recommendations  Follow physician's recommendations for discharge plan and follow up therapies     Assistance Recommended at Discharge Intermittent Supervision/Assistance   Patient can return home with the following A little help with walking and/or transfers;Assistance with cooking/housework;Assist for transportation   Equipment Recommendations  Rolling walker (2 wheels);BSC/3in1 (bari; already delivered to room 1/17)    Recommendations for Other Services       Precautions / Restrictions Precautions Precautions: Fall;Knee Precaution Booklet Issued: Yes (comment) Precaution Comments: reviewed per TKA handout Required Braces or Orthoses: Knee Immobilizer - Right Knee Immobilizer - Right: On when out of bed or walking Restrictions Weight Bearing Restrictions: Yes RLE Weight Bearing: Weight bearing as tolerated     Mobility  Bed Mobility Overal bed mobility: Needs Assistance Bed Mobility: Supine to Sit, Sit to Supine     Supine to sit: Min guard Sit to supine: Min assist   General bed mobility comments: minA for RLE assist to elevate back into bed; pt instructed on using gt belt as leg lifter to sit up but did not need to use it with KI donned. Lightheaded upon sitting up EOB.    Transfers Overall transfer level: Needs assistance Equipment used: Rolling walker (2 wheels) Transfers: Sit to/from Stand Sit to Stand: Min guard           General transfer comment: min guard, pt with continued lightheadedness upon standing    Ambulation/Gait Ambulation/Gait assistance: Min assist (chair follow for safety) Gait Distance (Feet): 35 Feet Assistive device: Rolling walker (2 wheels) Gait Pattern/deviations: Decreased weight shift to right, Step-to pattern, Decreased stance time - right, Antalgic, Trunk flexed, Decreased step length - left Gait velocity: decreased Gait velocity interpretation: <1.31 ft/sec, indicative of household ambulator   General Gait Details: KI donned  for ambulation. Pt reports mild lightheadedness at baseline with sitting/standing which was increasing with distance, pt reports severe pain as well. Pt had to turn around at  doorway and return to supine and was unable to go intended distance due to symptoms of lightheadedness/wooziness.   Stairs Stairs:  (pt has a ramp at sister's home where he will stay.)            Balance Overall balance assessment: Mild deficits observed, not formally tested                    Cognition Arousal/Alertness: Awake/alert Behavior During Therapy: WFL for tasks assessed/performed, Impulsive Overall Cognitive Status: Within Functional Limits for tasks assessed                                 General Comments: Pleasant, cooperative, limited mobility due to lightheadedness but following 1 and 2-step cues.        Exercises Total Joint Exercises Ankle Circles/Pumps: AROM, Both, 10 reps, Supine Quad Sets: AROM, Right, 10 reps, Supine Towel Squeeze: AROM, Both, 5 reps, Supine Heel Slides: AROM, AAROM, Right, 10 reps, Supine Hip ABduction/ADduction: AAROM, Right, 10 reps, Supine Straight Leg Raises: AROM, Right, 10 reps, Supine Long Arc Quad: AROM, Right, 10 reps, Seated, AAROM Knee Flexion: Right, AAROM (30 sec stretch) Goniometric ROM: R knee flexion ROM 3 deg to 60 deg in supine    General Comments General comments (skin integrity, edema, etc.): orthostatic, see comments above; he reports ice has helped with pain/edema; RLE up on yellow bone foam at end of session with heel floated      Pertinent Vitals/Pain Pain Assessment Pain Assessment: 0-10 Pain Score: 8  Faces Pain Scale: Hurts whole lot Pain Location: R knee with AAROM and gait; reports ~5/10 at rest Pain Descriptors / Indicators: Aching, Throbbing, Sharp, Discomfort, Grimacing Pain Intervention(s): Limited activity within patient's tolerance, Monitored during session, Repositioned, RN gave pain meds during session, Ice applied (tylenol given prior to pt standing up)     PT Goals (current goals can now be found in the care plan section) Acute Rehab PT Goals Patient Stated Goal:  return home PT Goal Formulation: With patient Time For Goal Achievement: 05/04/22 Progress towards PT goals: Progressing toward goals (slowly, lightheadedness limiting)    Frequency    7X/week      PT Plan Current plan remains appropriate    Co-evaluation              AM-PAC PT "6 Clicks" Mobility   Outcome Measure  Help needed turning from your back to your side while in a flat bed without using bedrails?: None Help needed moving from lying on your back to sitting on the side of a flat bed without using bedrails?: A Little (without rails; HOB flat) Help needed moving to and from a bed to a chair (including a wheelchair)?: A Little Help needed standing up from a chair using your arms (e.g., wheelchair or bedside chair)?: A Little Help needed to walk in hospital room?: A Little Help needed climbing 3-5 steps with a railing? : Total 6 Click Score: 17    End of Session Equipment Utilized During Treatment: Gait belt;Right knee immobilizer Activity Tolerance: Patient tolerated treatment well;Treatment limited secondary to medical complications (Comment);Other (comment);Patient limited by pain (symptomatic orthostatic hypotension in PM session as well) Patient left: with call bell/phone within reach;in bed;with bed alarm set;Other (comment) (pt  icing/elevating RLE; also got him more ice water as per RN his urine still appeared dark; HOB elevated to simulate chair posture) Nurse Communication: Mobility status;Other (comment);Precautions;Patient requests pain meds (orthostatic BP; pt symptoms/nausea; SCD machine malfunction) PT Visit Diagnosis: Difficulty in walking, not elsewhere classified (R26.2);Pain Pain - Right/Left: Right Pain - part of body: Knee     Time: 7159-5396 PT Time Calculation (min) (ACUTE ONLY): 35 min  Charges:  $Gait Training: 8-22 mins $Therapeutic Exercise: 8-22 mins                     Rilda Bulls P., PTA Acute Rehabilitation Services Secure Chat  Preferred 9a-5:30pm Office: [place] 04/28/2022, 5:34 PM

## 2022-04-28 NOTE — Progress Notes (Signed)
Pt. Refused CPM, will like to try with next dose of pain meds

## 2022-04-29 DIAGNOSIS — M1711 Unilateral primary osteoarthritis, right knee: Secondary | ICD-10-CM | POA: Diagnosis not present

## 2022-04-29 LAB — GLUCOSE, CAPILLARY: Glucose-Capillary: 124 mg/dL — ABNORMAL HIGH (ref 70–99)

## 2022-04-29 NOTE — Progress Notes (Addendum)
Physical Therapy Treatment Patient Details Name: Jerome Sims MRN: [id number] DOB: 1955-10-07 Today's Date: 04/29/2022   History of Present Illness Pt is 67 yo make who presents on 04/26/22 for R TKA. Episode of symptomatic orthostatic hypotension 1/17 AM PT session. No symptoms of OH 1/18, MAP stable. PMH: OA, HTN, HLD, DM2, ACDF, COPD, morbid obesity, OSA.    PT Comments    Pt received in supine, agreeable to therapy session and with good participation and tolerance for transfer, gait and HEP instruction. Pt able to perform bed mobility modI from flat bed and transfers/gait with up to Min guard assist. Pt BP MAP WFL with supine to sit and sit to stand transfers and pt denies symptoms of lightheadedness this date. Discussed curb step and car transfers, use of ice, edema mgmt, use of KI, and gradual progression of activity/walking schedule within tolerance, pt/family receptive. Anticipate pt safe to DC from a functional mobility standpoint once he is medically cleared, RN notified.  Recommendations for follow up therapy are one component of a multi-disciplinary discharge planning process, led by the attending physician.  Recommendations may be updated based on patient status, additional functional criteria and insurance authorization.  Follow Up Recommendations  Follow physician's recommendations for discharge plan and follow up therapies     Assistance Recommended at Discharge Intermittent Supervision/Assistance  Patient can return home with the following A little help with walking and/or transfers;Assistance with cooking/housework;Assist for transportation   Equipment Recommendations  Rolling walker (2 wheels);BSC/3in1 (bari; already delivered to room 1/17)    Recommendations for Other Services       Precautions / Restrictions Precautions Precautions: Fall;Knee Precaution Booklet Issued: Yes (comment) Precaution Comments: reviewed per TKA handout Required Braces or Orthoses: Knee  Immobilizer - Right Knee Immobilizer - Right: On when out of bed or walking Restrictions Weight Bearing Restrictions: Yes RLE Weight Bearing: Weight bearing as tolerated     Mobility  Bed Mobility Overal bed mobility: Needs Assistance Bed Mobility: Supine to Sit, Sit to Supine     Supine to sit: Modified independent (Device/Increase time) Sit to supine: Modified independent (Device/Increase time)   General bed mobility comments: From flat bed, pt able to perform with and without RLE in KI without physical assist; increased effort    Transfers Overall transfer level: Needs assistance Equipment used: Rolling walker (2 wheels) Transfers: Sit to/from Stand Sit to Stand: Min guard           General transfer comment: from EOB<>RW, pt able to stand from lowest bed height, min cues for safe LE and UE placement    Ambulation/Gait Ambulation/Gait assistance: Min guard, Supervision Gait Distance (Feet): 75 Feet Assistive device: Rolling walker (2 wheels) Gait Pattern/deviations: Decreased weight shift to right, Step-to pattern, Decreased stance time - right, Antalgic, Trunk flexed, Decreased step length - left Gait velocity: decreased     General Gait Details: KI donned for ambulation. Pt denies lightheadedness this session, BP stable upon standing after ~1 minute and pt able to perform household distance gait trial with increased RLE pain but no buckling/LOB or dizziness throughout. Family present and instructed on guarding positions with gait belt.   Stairs Stairs:  (pt has a ramp; reviewed step sequencing in case he needs to go up a curb)           Wheelchair Mobility    Modified Rankin (Stroke Patients Only)       Balance Overall balance assessment: Mild deficits observed, not formally tested  Cognition Arousal/Alertness: Awake/alert Behavior During Therapy: WFL for tasks assessed/performed,  Impulsive Overall Cognitive Status: Within Functional Limits for tasks assessed                                 General Comments: Pleasant, cooperative        Exercises Total Joint Exercises Ankle Circles/Pumps: AROM, Both, 10 reps, Supine Quad Sets: AROM, Right, Supine, 5 reps Heel Slides: AROM, Right, 10 reps, Supine Hip ABduction/ADduction: AAROM, Right, 10 reps, Supine, AROM Straight Leg Raises: AAROM, AROM, Right, 5 reps, Supine Long Arc Quad: AROM, Right, 10 reps, Seated, AAROM Knee Flexion: AROM, AAROM, Right, 10 reps, Seated Goniometric ROM: R knee flexion ROM grossly 5 deg to 70 deg seated EOB    General Comments        Pertinent Vitals/Pain Pain Assessment Pain Assessment: 0-10 Pain Score: 7  Pain Location: R knee with AAROM and gait; reports less at rest Pain Descriptors / Indicators: Aching, Sharp, Discomfort, Grimacing Pain Intervention(s): Limited activity within patient's tolerance, Monitored during session, Premedicated before session, Repositioned, Ice applied (pain meds ~20 mins prior to session)           PT Goals (current goals can now be found in the care plan section) Acute Rehab PT Goals Patient Stated Goal: return home PT Goal Formulation: With patient Time For Goal Achievement: 05/04/22 Progress towards PT goals: Progressing toward goals    Frequency    7X/week      PT Plan Current plan remains appropriate       AM-PAC PT "6 Clicks" Mobility   Outcome Measure  Help needed turning from your back to your side while in a flat bed without using bedrails?: None Help needed moving from lying on your back to sitting on the side of a flat bed without using bedrails?: None Help needed moving to and from a bed to a chair (including a wheelchair)?: A Little Help needed standing up from a chair using your arms (e.g., wheelchair or bedside chair)?: A Little Help needed to walk in hospital room?: A Little Help needed climbing 3-5  steps with a railing? : A Lot 6 Click Score: 19    End of Session Equipment Utilized During Treatment: Gait belt;Right knee immobilizer Activity Tolerance: Patient tolerated treatment well;Patient limited by pain Patient left: with call bell/phone within reach;in bed;Other (comment);with family/visitor present (pt icing/elevating RLE, family in the room) Nurse Communication: Mobility status;Other (comment) (pt OK for DC from a PTA standpoint, has made appropriate progress toward POC goals) PT Visit Diagnosis: Difficulty in walking, not elsewhere classified (R26.2);Pain Pain - Right/Left: Right Pain - part of body: Knee     Time: 1030-1105 PT Time Calculation (min) (ACUTE ONLY): 35 min  Charges:  $Gait Training: 8-22 mins $Therapeutic Exercise: 8-22 mins                     Lavenia Stumpo P., PTA Acute Rehabilitation Services Secure Chat Preferred 9a-5:30pm Office: [place] 04/29/2022, 11:35 AM

## 2022-04-29 NOTE — Progress Notes (Signed)
Patient ID: Jerome Sims, male   DOB: Nov 07, 1955, 67 y.o.   MRN: [id number]   Subjective: 3 Days Post-Op Procedure(s) (LRB): RIGHT TOTAL KNEE ARTHROPLASTY (Right) Patient reports pain as mild and moderate.    Objective: Vital signs in last 24 hours: Temp:  [97.7 F (36.5 C)-98.6 F (37 C)] 98 F (36.7 C) (01/18 0720) Pulse Rate:  [78-81] 79 (01/18 0720) Resp:  [17-18] 18 (01/18 0720) BP: (124-140)/(60-78) 132/78 (01/18 0720) SpO2:  [93 %-97 %] 95 % (01/18 0720)  Intake/Output from previous day: 01/17 0701 - 01/18 0700 In: 1320 [P.O.:1320] Out: 2750 [Urine:2750] Intake/Output this shift: No intake/output data recorded.  Recent Labs    04/28/22 1922  HGB 11.2*   Recent Labs    04/28/22 1922  WBC 9.1  RBC 3.78*  HCT 33.5*  PLT 186   Recent Labs    04/28/22 1922  NA 133*  K 4.2  CL 99  CO2 26  BUN 12  CREATININE 1.28*  GLUCOSE 131*  CALCIUM 8.2*   No results for input(s): "LABPT", "INR" in the last 72 hours.  Neurologically intact No results found.  Assessment/Plan: 3 Days Post-Op Procedure(s) (LRB): RIGHT TOTAL KNEE ARTHROPLASTY (Right) Up with therapy Discharge home with home health  Marybelle Killings 04/29/2022, 7:52 AM

## 2022-04-29 NOTE — Discharge Summary (Signed)
Physician Discharge Summary  Patient ID: Jerome Sims MRN: [id number] DOB/AGE: 65-Dec-1957 65 y.o.  Admit date: 04/26/2022 Discharge date: 04/29/2022  Admission Diagnoses: Right knee primary osteoarthritis  Discharge Diagnoses: Right knee primary osteoarthritis Principal Problem:   S/P total knee arthroplasty   Right   Discharged Condition: good  Hospital Course: 67 year old male admitted for end-stage right knee osteoarthritis failed conservative treatment including anti-inflammatories and injections.  Patient was admitted and underwent total knee arthroplasty right knee without complications.  Work with PT OT.  Slow for mobilization and was ready for discharge safe making it back and forth to the bathroom with home health physical therapy arranged.  He was using a walker.  Patient had some lightheadedness and slow progress with mobility which delayed his discharge.  Consults:  PT,OT  Significant Diagnostic Studies:   Treatments: surgery:  Right total knee arthroplasty  Discharge Exam: Blood pressure 132/78, pulse 79, temperature 98 F (36.7 C), resp. rate 18, height '5\' 8"'$  (1.727 m), weight (!) 139.7 kg, SpO2 95 %. Extremities: Well-healed right knee incision tach pulses.  Disposition: Discharge disposition: 01-Home or [place] with HHPT  Allergies as of 04/29/2022   No Known Allergies      Medication List     STOP taking these medications    acetaminophen 500 MG tablet Commonly known as: TYLENOL   methocarbamol 500 MG tablet Commonly known as: ROBAXIN       TAKE these medications    aspirin 325 MG tablet Commonly known as: Bayer Aspirin Take 1 tablet (325 mg total) by mouth daily. Take one aspirin a day for one month then stop   gabapentin 300 MG capsule Commonly known as: NEURONTIN Take 300 mg by mouth daily as needed (1-2 capsules).   glucose blood test strip Commonly known as: OneTouch Verio USE TO TEST BLOOD GLUCOSE ONCE DAILY    lisinopril 2.5 MG tablet Commonly known as: ZESTRIL Take 2.5 mg by mouth daily.   meloxicam 15 MG tablet Commonly known as: MOBIC Take 15 mg by mouth daily.   onetouch ultrasoft lancets USE TO TEST BLOOD GLUCOSE ONCE DAILY   oxyCODONE-acetaminophen 5-325 MG tablet Commonly known as: Percocet Take 1-2 tablets by mouth every 6 (six) hours as needed for severe pain.   Ozempic (1 MG/DOSE) 4 MG/3ML Sopn Generic drug: Semaglutide (1 MG/DOSE) Inject 1 mg into the skin every 'Sunday.   tiZANidine 4 MG tablet Commonly known as: ZANAFLEX Take 4 mg by mouth at bedtime as needed for muscle spasms.   triamcinolone ointment 0.5 % Commonly known as: KENALOG Apply 1 application topically 2 (two) times daily. What changed:  when to take this reasons to take this               Durable Medical Equipment  (From admission, onward)           Start     Ordered   04/28/22 0846  For home use only DME 3 n 1  Once       Comments: Bariatric BSC. Confined to one room.   04/28/22 0847   04/28/22 0845  For home use only DME Walker rolling  Once       Comments: Bariatric RW  Question Answer Comment  Walker: With 5 Inch Wheels   Patient needs a walker to treat with the following condition Gait instability      01'$ /17/24 0847            Follow-up  Information     Marybelle Killings, MD Follow up in 1 week(s).   Specialty: Orthopedic Surgery Contact information: [place] [address] [phone]                 Signed: Marybelle Killings 04/29/2022, 1:14 PM

## 2022-04-29 NOTE — Progress Notes (Signed)
D.c instructions were given to the patient.and his sister. All questions were answered.

## 2022-04-29 NOTE — Plan of Care (Signed)
  Problem: Education: Goal: Knowledge of the prescribed therapeutic regimen will improve Outcome: Adequate for Discharge Goal: Individualized Educational Video(s) Outcome: Adequate for Discharge   Problem: Activity: Goal: Ability to avoid complications of mobility impairment will improve Outcome: Adequate for Discharge Goal: Range of joint motion will improve Outcome: Adequate for Discharge   Problem: Clinical Measurements: Goal: Postoperative complications will be avoided or minimized Outcome: Adequate for Discharge   Problem: Pain Management: Goal: Pain level will decrease with appropriate interventions Outcome: Adequate for Discharge   Problem: Skin Integrity: Goal: Will show signs of wound healing Outcome: Adequate for Discharge   Problem: Education: Goal: Ability to describe self-care measures that may prevent or decrease complications (Diabetes Survival Skills Education) will improve Outcome: Adequate for Discharge Goal: Individualized Educational Video(s) Outcome: Adequate for Discharge   Problem: Coping: Goal: Ability to adjust to condition or change in health will improve Outcome: Adequate for Discharge   Problem: Fluid Volume: Goal: Ability to maintain a balanced intake and output will improve Outcome: Adequate for Discharge   Problem: Health Behavior/Discharge Planning: Goal: Ability to identify and utilize available resources and services will improve Outcome: Adequate for Discharge Goal: Ability to manage health-related needs will improve Outcome: Adequate for Discharge   Problem: Metabolic: Goal: Ability to maintain appropriate glucose levels will improve Outcome: Adequate for Discharge   Problem: Nutritional: Goal: Maintenance of adequate nutrition will improve Outcome: Adequate for Discharge Goal: Progress toward achieving an optimal weight will improve Outcome: Adequate for Discharge   Problem: Skin Integrity: Goal: Risk for impaired skin  integrity will decrease Outcome: Adequate for Discharge   Problem: Tissue Perfusion: Goal: Adequacy of tissue perfusion will improve Outcome: Adequate for Discharge   Problem: Education: Goal: Knowledge of General Education information will improve Description: Including pain rating scale, medication(s)/side effects and non-pharmacologic comfort measures Outcome: Adequate for Discharge   Problem: Health Behavior/Discharge Planning: Goal: Ability to manage health-related needs will improve Outcome: Adequate for Discharge   Problem: Clinical Measurements: Goal: Ability to maintain clinical measurements within normal limits will improve Outcome: Adequate for Discharge Goal: Will remain free from infection Outcome: Adequate for Discharge Goal: Diagnostic test results will improve Outcome: Adequate for Discharge Goal: Respiratory complications will improve Outcome: Adequate for Discharge Goal: Cardiovascular complication will be avoided Outcome: Adequate for Discharge   Problem: Activity: Goal: Risk for activity intolerance will decrease Outcome: Adequate for Discharge   Problem: Nutrition: Goal: Adequate nutrition will be maintained Outcome: Adequate for Discharge   Problem: Coping: Goal: Level of anxiety will decrease Outcome: Adequate for Discharge   Problem: Elimination: Goal: Will not experience complications related to bowel motility Outcome: Adequate for Discharge Goal: Will not experience complications related to urinary retention Outcome: Adequate for Discharge   Problem: Pain Managment: Goal: General experience of comfort will improve Outcome: Adequate for Discharge   Problem: Safety: Goal: Ability to remain free from injury will improve Outcome: Adequate for Discharge   Problem: Skin Integrity: Goal: Risk for impaired skin integrity will decrease Outcome: Adequate for Discharge   

## 2022-04-30 ENCOUNTER — Telehealth: Payer: Self-pay | Admitting: Orthopaedic Surgery

## 2022-04-30 NOTE — Telephone Encounter (Signed)
Is this ok?

## 2022-04-30 NOTE — Telephone Encounter (Signed)
Called and gave verbal ok 

## 2022-04-30 NOTE — Telephone Encounter (Signed)
P/T needing an order from Dr. Lorin Mercy. Please call [phone] [facility]

## 2022-05-04 ENCOUNTER — Ambulatory Visit (INDEPENDENT_AMBULATORY_CARE_PROVIDER_SITE_OTHER): Payer: Medicare Other

## 2022-05-04 ENCOUNTER — Encounter: Payer: Self-pay | Admitting: Orthopaedic Surgery

## 2022-05-04 ENCOUNTER — Ambulatory Visit (INDEPENDENT_AMBULATORY_CARE_PROVIDER_SITE_OTHER): Payer: Medicare Other | Admitting: Orthopaedic Surgery

## 2022-05-04 VITALS — BP 158/87 | HR 82 | Ht 68.0 in | Wt 308.0 lb

## 2022-05-04 DIAGNOSIS — Z96651 Presence of right artificial knee joint: Secondary | ICD-10-CM

## 2022-05-04 MED ORDER — OXYCODONE-ACETAMINOPHEN 5-325 MG PO TABS: 1.0000 | ORAL_TABLET | Freq: Four times a day (QID) | ORAL | 0 refills | Status: DC | PRN

## 2022-05-04 MED ORDER — TIZANIDINE HCL 4 MG PO TABS: 4.0000 mg | ORAL_TABLET | Freq: Four times a day (QID) | ORAL | 0 refills | Status: DC | PRN

## 2022-05-04 NOTE — Progress Notes (Signed)
Post-Op Visit Note   Patient: Jerome Sims           Date of Birth: 1956-03-18           MRN: [id number] Visit Date: 05/04/2022 PCP: Dorothyann Peng, NP   Assessment & Plan: Post right total knee arthroplasty.  Dressing changed staples look good.  Return 1 week for staple removal and tincture benzoin Steri-Strips.  He is working on therapy range of motion and strengthening.  Chief Complaint:  Chief Complaint  Patient presents with   Right Knee - Routine Post Op, Follow-up    04/26/2022 Right TKA   Visit Diagnoses:  1. S/P total knee arthroplasty, right     Plan: Return 1 week.  Follow-Up Instructions: No follow-ups on file.   Orders:  Orders Placed This Encounter  Procedures   XR Knee 1-2 Views Right   No orders of the defined types were placed in this encounter.   Imaging: XR Knee 1-2 Views Right  Result Date: 05/04/2022 Standing AP both knees lateral right knee demonstrates right total knee arthroplasty cemented without evidence of loosening or subsidence good position alignment noted. Impression: Satisfactory right total knee arthroplasty.   PMFS History: Patient Active Problem List   Diagnosis Date Noted   S/P total knee arthroplasty 04/26/2022   Essential hypertension 02/21/2022   Hyperlipidemia associated with type 2 diabetes mellitus ([place]) 02/21/2022   Preoperative cardiovascular examination 02/21/2022   Unilateral primary osteoarthritis, right knee 02/14/2022   S/P cervical spinal fusion 07/15/2017   Type 2 diabetes mellitus without complication, without long-term current use of insulin ([place]) 05/31/2017   Left hip pain 03/24/2017   Carpal tunnel syndrome of left wrist 12/15/2016   Kidney stones 09/23/2016   Heavy smoker (more than 20 cigarettes per day) 09/21/2016   Sun-damaged skin 09/21/2016   Morbid obesity with BMI of 45.0-49.9, adult ([place]) 09/20/2016   OSA on CPAP 09/20/2016   Other emphysema ([place]) 09/20/2016   Past Medical History:  Diagnosis  Date   Arthritis    Complication of anesthesia    Woke up swinging   COPD (chronic obstructive pulmonary disease) ([place])    Diabetes mellitus without complication ([place])    History of kidney stones    Hypertension    HX OF 1 YEAR AGO NEVER TOOK MEDS FOR   Obesity    Sleep apnea    Tobacco use     Family History  Problem Relation Age of Onset   Heart defect Maternal Grandmother    AAA (abdominal aortic aneurysm) Mother    AAA (abdominal aortic aneurysm) Father    Colon cancer Neg Hx    Esophageal cancer Neg Hx    Pancreatic cancer Neg Hx    Rectal cancer Neg Hx    Stomach cancer Neg Hx    Diabetes Neg Hx     Past Surgical History:  Procedure Laterality Date   ANTERIOR CERVICAL DECOMP/DISCECTOMY FUSION N/A 07/04/2017   Procedure: C5-6, C6-7, ANTERIOR CERVICAL DECOMPRESSION/DISCECTOMY FUSION,  ALLOGRAFT PLATE;  Surgeon: Marybelle Killings, MD;  Location: [facility];  Service: Orthopedics;  Laterality: N/A;   APPENDECTOMY     back ablation  2023   Done at [place]     years ago    [place] Right    CARPAL TUNNEL RELEASE Left 07/04/2017   Procedure: LEFT CARPAL TUNNEL RELEASE;  Surgeon: Marybelle Killings, MD;  Location: [facility];  Service: Orthopedics;  Laterality: Left;   CHOLECYSTECTOMY  COLONOSCOPY N/A 01/04/2017   Procedure: COLONOSCOPY;  Surgeon: Doran Stabler, MD;  Location: [facility];  Service: Gastroenterology;  Laterality: N/A;   KNEE SURGERY Right    TOTAL KNEE ARTHROPLASTY Right 04/26/2022   Procedure: RIGHT TOTAL KNEE ARTHROPLASTY;  Surgeon: Marybelle Killings, MD;  Location: [facility];  Service: Orthopedics;  Laterality: Right;  RNFA REQUESTED   Social History   Occupational History   Not on file  Tobacco Use   Smoking status: Former    Packs/day: 1.00    Years: 30.00    Total pack years: 30.00    Types: Cigarettes    Quit date: 2018    Years since quitting: 6.0   Smokeless tobacco: Never   Tobacco comments:    QUIT October 18 2016   Vaping Use   Vaping Use: Never used  Substance and Sexual Activity   Alcohol use: No    Alcohol/week: 0.0 standard drinks of alcohol   Drug use: No   Sexual activity: Not Currently

## 2022-05-05 ENCOUNTER — Telehealth: Payer: Self-pay | Admitting: Orthopaedic Surgery

## 2022-05-05 DIAGNOSIS — Z96651 Presence of right artificial knee joint: Secondary | ICD-10-CM

## 2022-05-05 NOTE — Telephone Encounter (Signed)
Mary (PT) called from [facility] called requesting orders for outpatient physical therapy. Please fax orders to [phone]. Mary phone number is [phone].

## 2022-05-05 NOTE — Telephone Encounter (Signed)
Patient asking when he can start P/T states his home health care nurse is inquiring. Please advise.Jerome Sims

## 2022-05-05 NOTE — Telephone Encounter (Signed)
FYI  I entered outpatient PT referral and faxed as requested.

## 2022-05-05 NOTE — Telephone Encounter (Signed)
Please advise 

## 2022-05-07 ENCOUNTER — Telehealth: Payer: Self-pay | Admitting: Orthopaedic Surgery

## 2022-05-07 NOTE — Telephone Encounter (Signed)
05/05/22 ov note & op note faxed to [facility] [phone]

## 2022-05-07 NOTE — Telephone Encounter (Signed)
Jerome Sims (PT) called from [facility] called with update. Pt blood pressure was elevated and pt forgot to take his bp meds for last 2 days. Pt also stepped out of a truck wrong. No drainage or swelling from incident. Pt is sore from tweeking knee a little. Jerome Sims phone number is [phone] if any questions

## 2022-05-07 NOTE — Telephone Encounter (Signed)
Patient states he forgot to get an appt for staple removal, I only have appt for feb 6th so he is scheduled for that day. Patient states he has P/T on the 5th and wants to get his appt moved to a sooner date. Please advise..[phone]

## 2022-05-07 NOTE — Telephone Encounter (Signed)
Tried calling to schedule nurse visit. No answer.

## 2022-05-11 ENCOUNTER — Ambulatory Visit (INDEPENDENT_AMBULATORY_CARE_PROVIDER_SITE_OTHER): Payer: Medicare Other | Admitting: Orthopaedic Surgery

## 2022-05-11 ENCOUNTER — Encounter: Payer: Self-pay | Admitting: Orthopaedic Surgery

## 2022-05-11 VITALS — Ht 68.0 in | Wt 308.0 lb

## 2022-05-11 DIAGNOSIS — Z96651 Presence of right artificial knee joint: Secondary | ICD-10-CM

## 2022-05-11 MED ORDER — OXYCODONE-ACETAMINOPHEN 5-325 MG PO TABS: 1.0000 | ORAL_TABLET | Freq: Four times a day (QID) | ORAL | 0 refills | Status: DC | PRN

## 2022-05-11 NOTE — Telephone Encounter (Signed)
Patient has appt scheduled for this afternoon with Dr. Lorin Mercy. Staples will be removed at that visit.

## 2022-05-11 NOTE — Progress Notes (Signed)
Post-Op Visit Note   Patient: Jerome Sims           Date of Birth: March 31, 1956           MRN: [id number] Visit Date: 05/11/2022 PCP: Dorothyann Peng, NP   Assessment & Plan: Follow-up total knee arthroplasty staples are removed today incision looks good he is starting outpatient therapy next week.  Making good progress.  Pain medication renewed.  Chief Complaint:  Chief Complaint  Patient presents with   Right Knee - Routine Post Op, Follow-up    04/26/2022 Right TKA   Visit Diagnoses:  1. Status post total right knee replacement     Plan: Transition next week to outpatient therapy.  Return in 4 weeks.  Follow-Up Instructions: Return in about 4 weeks (around 06/08/2022).   Orders:  No orders of the defined types were placed in this encounter.  Meds ordered this encounter  Medications   oxyCODONE-acetaminophen (PERCOCET) 5-325 MG tablet    Sig: Take 1-2 tablets by mouth every 6 (six) hours as needed for severe pain.    Dispense:  45 tablet    Refill:  0    Imaging: No results found.  PMFS History: Patient Active Problem List   Diagnosis Date Noted   S/P total knee arthroplasty 04/26/2022   Essential hypertension 02/21/2022   Hyperlipidemia associated with type 2 diabetes mellitus ([place]) 02/21/2022   Preoperative cardiovascular examination 02/21/2022   S/P cervical spinal fusion 07/15/2017   Type 2 diabetes mellitus without complication, without long-term current use of insulin ([place]) 05/31/2017   Left hip pain 03/24/2017   Carpal tunnel syndrome of left wrist 12/15/2016   Kidney stones 09/23/2016   Heavy smoker (more than 20 cigarettes per day) 09/21/2016   Sun-damaged skin 09/21/2016   Morbid obesity with BMI of 45.0-49.9, adult ([place]) 09/20/2016   OSA on CPAP 09/20/2016   Other emphysema ([place]) 09/20/2016   Past Medical History:  Diagnosis Date   Arthritis    Complication of anesthesia    Woke up swinging   COPD (chronic obstructive pulmonary disease) (HCC)     Diabetes mellitus without complication (HCC)    History of kidney stones    Hypertension    HX OF 1 YEAR AGO NEVER TOOK MEDS FOR   Obesity    Sleep apnea    Tobacco use     Family History  Problem Relation Age of Onset   Heart defect Maternal Grandmother    AAA (abdominal aortic aneurysm) Mother    AAA (abdominal aortic aneurysm) Father    Colon cancer Neg Hx    Esophageal cancer Neg Hx    Pancreatic cancer Neg Hx    Rectal cancer Neg Hx    Stomach cancer Neg Hx    Diabetes Neg Hx     Past Surgical History:  Procedure Laterality Date   ANTERIOR CERVICAL DECOMP/DISCECTOMY FUSION N/A 07/04/2017   Procedure: C5-6, C6-7, ANTERIOR CERVICAL DECOMPRESSION/DISCECTOMY FUSION,  ALLOGRAFT PLATE;  Surgeon: Marybelle Killings, MD;  Location: [facility];  Service: Orthopedics;  Laterality: N/A;   APPENDECTOMY     back ablation  2023   Done at [place] [facility]     years ago    [place] Right    CARPAL TUNNEL RELEASE Left 07/04/2017   Procedure: LEFT CARPAL TUNNEL RELEASE;  Surgeon: Marybelle Killings, MD;  Location: [facility];  Service: Orthopedics;  Laterality: Left;   CHOLECYSTECTOMY     COLONOSCOPY N/A 01/04/2017  Procedure: COLONOSCOPY;  Surgeon: Doran Stabler, MD;  Location: [facility];  Service: Gastroenterology;  Laterality: N/A;   KNEE SURGERY Right    TOTAL KNEE ARTHROPLASTY Right 04/26/2022   Procedure: RIGHT TOTAL KNEE ARTHROPLASTY;  Surgeon: Marybelle Killings, MD;  Location: [facility];  Service: Orthopedics;  Laterality: Right;  RNFA REQUESTED   Social History   Occupational History   Not on file  Tobacco Use   Smoking status: Former    Packs/day: 1.00    Years: 30.00    Total pack years: 30.00    Types: Cigarettes    Quit date: 2018    Years since quitting: 6.0   Smokeless tobacco: Never   Tobacco comments:    QUIT October 18 2016  Vaping Use   Vaping Use: Never used  Substance and Sexual Activity   Alcohol use: No    Alcohol/week: 0.0 standard drinks of  alcohol   Drug use: No   Sexual activity: Not Currently

## 2022-05-11 NOTE — Telephone Encounter (Signed)
Patient coming in to office for appointment this afternoon. Will advise at appointment.

## 2022-05-13 ENCOUNTER — Telehealth: Payer: Self-pay | Admitting: Orthopaedic Surgery

## 2022-05-13 NOTE — Telephone Encounter (Signed)
Patient referred to [facility], records 04/26/22-present faxed for upcoming appointment. Fax [phone]

## 2022-05-18 ENCOUNTER — Ambulatory Visit: Payer: Medicare Other | Admitting: Orthopaedic Surgery

## 2022-05-24 ENCOUNTER — Other Ambulatory Visit: Payer: Self-pay | Admitting: Physician Assistant

## 2022-05-24 ENCOUNTER — Telehealth: Payer: Self-pay | Admitting: Orthopaedic Surgery

## 2022-05-24 MED ORDER — OXYCODONE-ACETAMINOPHEN 5-325 MG PO TABS: 1.0000 | ORAL_TABLET | Freq: Four times a day (QID) | ORAL | 0 refills | Status: DC | PRN

## 2022-05-24 MED ORDER — TIZANIDINE HCL 4 MG PO TABS: 4.0000 mg | ORAL_TABLET | Freq: Four times a day (QID) | ORAL | 0 refills | Status: DC | PRN

## 2022-05-24 NOTE — Telephone Encounter (Signed)
Lvm advising  

## 2022-05-24 NOTE — Telephone Encounter (Signed)
Patient states he needs pain medication and muscle relaxer's refilled. Please advise.

## 2022-06-08 ENCOUNTER — Other Ambulatory Visit: Payer: Self-pay | Admitting: Physician Assistant

## 2022-06-08 ENCOUNTER — Encounter: Payer: Self-pay | Admitting: Orthopaedic Surgery

## 2022-06-08 ENCOUNTER — Ambulatory Visit (INDEPENDENT_AMBULATORY_CARE_PROVIDER_SITE_OTHER): Payer: Medicare Other | Admitting: Orthopaedic Surgery

## 2022-06-08 VITALS — BP 157/82 | HR 69 | Ht 68.0 in | Wt 308.0 lb

## 2022-06-08 DIAGNOSIS — Z96651 Presence of right artificial knee joint: Secondary | ICD-10-CM

## 2022-06-08 NOTE — Progress Notes (Signed)
Post-Op Visit Note   Patient: Jerome Sims           Date of Birth: [birth year]           MRN: [id number] Visit Date: 06/08/2022 PCP: Dorothyann Peng, NP   Assessment & Plan: Follow-up right total knee arthroplasty incision looks good he has no extension lag but still has some quad weakness with sitting position and resisted pressure at his ankle.  He has some TED hose he needs to wear them for the venous stasis discoloration to prevent future problems that may develop.  Chief Complaint:  Chief Complaint  Patient presents with   Right Knee - Routine Post Op, Follow-up    04/26/2022 Right TKA   Visit Diagnoses:  1. Status post total right knee replacement     Plan: ROV one month  Follow-Up Instructions: Return in about 1 month (around 07/07/2022).   Orders:  No orders of the defined types were placed in this encounter.  No orders of the defined types were placed in this encounter.   Imaging: No results found.  PMFS History: Patient Active Problem List   Diagnosis Date Noted   S/P total knee arthroplasty 04/26/2022   Essential hypertension 02/21/2022   Hyperlipidemia associated with type 2 diabetes mellitus ([place]) 02/21/2022   Preoperative cardiovascular examination 02/21/2022   S/P cervical spinal fusion 07/15/2017   Type 2 diabetes mellitus without complication, without long-term current use of insulin ([place]) 05/31/2017   Left hip pain 03/24/2017   Carpal tunnel syndrome of left wrist 12/15/2016   Kidney stones 09/23/2016   Heavy smoker (more than 20 cigarettes per day) 09/21/2016   Sun-damaged skin 09/21/2016   Morbid obesity with BMI of 45.0-49.9, adult ([place]) 09/20/2016   OSA on CPAP 09/20/2016   Other emphysema ([place]) 09/20/2016   Past Medical History:  Diagnosis Date   Arthritis    Complication of anesthesia    Woke up swinging   COPD (chronic obstructive pulmonary disease) (HCC)    Diabetes mellitus without complication (HCC)    History of kidney stones     Hypertension    HX OF 1 YEAR AGO NEVER TOOK MEDS FOR   Obesity    Sleep apnea    Tobacco use     Family History  Problem Relation Age of Onset   Heart defect Maternal Grandmother    AAA (abdominal aortic aneurysm) Mother    AAA (abdominal aortic aneurysm) Father    Colon cancer Neg Hx    Esophageal cancer Neg Hx    Pancreatic cancer Neg Hx    Rectal cancer Neg Hx    Stomach cancer Neg Hx    Diabetes Neg Hx     Past Surgical History:  Procedure Laterality Date   ANTERIOR CERVICAL DECOMP/DISCECTOMY FUSION N/A 07/04/2017   Procedure: C5-6, C6-7, ANTERIOR CERVICAL DECOMPRESSION/DISCECTOMY FUSION,  ALLOGRAFT PLATE;  Surgeon: Marybelle Killings, MD;  Location: [facility];  Service: Orthopedics;  Laterality: N/A;   APPENDECTOMY     back ablation  2023   Done at [place]     years ago    [place] Right    CARPAL TUNNEL RELEASE Left 07/04/2017   Procedure: LEFT CARPAL TUNNEL RELEASE;  Surgeon: Marybelle Killings, MD;  Location: [facility];  Service: Orthopedics;  Laterality: Left;   CHOLECYSTECTOMY     COLONOSCOPY N/A 01/04/2017   Procedure: COLONOSCOPY;  Surgeon: Doran Stabler, MD;  Location: [facility];  Service: Gastroenterology;  Laterality: N/A;   KNEE SURGERY Right    TOTAL KNEE ARTHROPLASTY Right 04/26/2022   Procedure: RIGHT TOTAL KNEE ARTHROPLASTY;  Surgeon: Marybelle Killings, MD;  Location: [facility];  Service: Orthopedics;  Laterality: Right;  RNFA REQUESTED   Social History   Occupational History   Not on file  Tobacco Use   Smoking status: Former    Packs/day: 1.00    Years: 30.00    Total pack years: 30.00    Types: Cigarettes    Quit date: 2018    Years since quitting: 6.1   Smokeless tobacco: Never   Tobacco comments:    QUIT October 18 2016  Vaping Use   Vaping Use: Never used  Substance and Sexual Activity   Alcohol use: No    Alcohol/week: 0.0 standard drinks of alcohol   Drug use: No   Sexual activity: Not Currently

## 2022-06-22 ENCOUNTER — Ambulatory Visit (INDEPENDENT_AMBULATORY_CARE_PROVIDER_SITE_OTHER): Payer: Medicare Other | Admitting: Family Medicine

## 2022-06-22 ENCOUNTER — Encounter: Payer: Self-pay | Admitting: Family Medicine

## 2022-06-22 VITALS — BP 124/82 | HR 75 | Temp 98.2°F | Ht 67.75 in | Wt 304.4 lb

## 2022-06-22 DIAGNOSIS — E119 Type 2 diabetes mellitus without complications: Secondary | ICD-10-CM | POA: Diagnosis not present

## 2022-06-22 DIAGNOSIS — I10 Essential (primary) hypertension: Secondary | ICD-10-CM

## 2022-06-22 DIAGNOSIS — M545 Low back pain, unspecified: Secondary | ICD-10-CM | POA: Diagnosis not present

## 2022-06-22 DIAGNOSIS — G8929 Other chronic pain: Secondary | ICD-10-CM | POA: Diagnosis not present

## 2022-06-22 MED ORDER — GABAPENTIN 300 MG PO CAPS: 600.0000 mg | ORAL_CAPSULE | Freq: Every day | ORAL | 5 refills | Status: DC | PRN

## 2022-06-22 MED ORDER — OZEMPIC (1 MG/DOSE) 4 MG/3ML ~~LOC~~ SOPN: 1.0000 mg | PEN_INJECTOR | SUBCUTANEOUS | 5 refills | Status: DC

## 2022-06-22 MED ORDER — MELOXICAM 15 MG PO TABS: 15.0000 mg | ORAL_TABLET | Freq: Every day | ORAL | 1 refills | Status: DC

## 2022-06-22 MED ORDER — TIZANIDINE HCL 4 MG PO TABS: 4.0000 mg | ORAL_TABLET | Freq: Four times a day (QID) | ORAL | 5 refills | Status: AC | PRN

## 2022-06-22 MED ORDER — LISINOPRIL 2.5 MG PO TABS: 2.5000 mg | ORAL_TABLET | Freq: Every day | ORAL | 1 refills | Status: DC

## 2022-06-22 MED ORDER — ONDANSETRON 4 MG PO TBDP: 4.0000 mg | ORAL_TABLET | Freq: Three times a day (TID) | ORAL | 0 refills | Status: DC | PRN

## 2022-06-22 NOTE — Progress Notes (Unsigned)
New Patient Office Visit  Subjective    Patient ID: Jerome Sims, male    DOB: Jul 17, 1955  Age: 67 y.o. MRN: [id number]  CC:  Chief Complaint  Patient presents with   Establish Care    HPI [name] presents to establish care Pt reports his previous PCP is no longer accepting his insurance. States that he did change his insurance to [employer].   Pt reports he had a knee replacement on 04/26/2022. States that he is doing well, pain is now mostly resolved. He had blood work done before his surgery which I have reviewed with the patient's notes from his orthopedist. States he is going to the gym and working out now.   DM- states that he is doing very well on the ozempic. States that for the first 2 days after his shot he gets very nauseous but then he is able to tolerate the medication. States he was on 1 mh weekly but he has been out of his medication for the past couple of months. Reviewed A1C from January and it was 6.6.   HTN -- BP in office performed and is well controlled. He  reports no side effects to the medications, no chest pain, SOB, dizziness or headaches. He has a BP cuff at home and is checking BP regularly, reports they are in the normal range.   Chronic back pain-- pt taking muscle relaxer, gabapentin as needed and meloxicam 15 mg daily. States that this is helping keep the pain tolerable. States he is trying to lose weight to help with his pain as well. Reports that he has had injections in his back in the past, last one ws in October 2023. Sees Dr. Colbert Coyer for this also.  I have reviewed all aspects of the patient's past medical history including surgeries, family history, medications, social history, etc.  Outpatient Encounter Medications as of 06/22/2022  Medication Sig   aspirin (BAYER ASPIRIN) 325 MG tablet Take 1 tablet (325 mg total) by mouth daily. Take one aspirin a day for one month then stop   glucose blood (ONETOUCH VERIO) test strip USE TO TEST BLOOD  GLUCOSE ONCE DAILY   Lancets (ONETOUCH ULTRASOFT) lancets USE TO TEST BLOOD GLUCOSE ONCE DAILY   ondansetron (ZOFRAN-ODT) 4 MG disintegrating tablet Take 1 tablet (4 mg total) by mouth every 8 (eight) hours as needed for nausea or vomiting (for nausea from wegovy or other source).   oxyCODONE-acetaminophen (PERCOCET) 5-325 MG tablet Take 1-2 tablets by mouth every 6 (six) hours as needed for severe pain.   [DISCONTINUED] gabapentin (NEURONTIN) 300 MG capsule Take 300 mg by mouth daily as needed (1-2 capsules).   [DISCONTINUED] lisinopril (ZESTRIL) 2.5 MG tablet Take 2.5 mg by mouth daily.   [DISCONTINUED] meloxicam (MOBIC) 15 MG tablet Take 15 mg by mouth daily.   [DISCONTINUED] OZEMPIC, 1 MG/DOSE, 4 MG/3ML SOPN Inject 1 mg into the skin every Sunday.   [DISCONTINUED] tiZANidine (ZANAFLEX) 4 MG tablet TAKE 1 TABLET BY MOUTH EVERY 6 HOURS AS NEEDED FOR MUSCLE SPASM   gabapentin (NEURONTIN) 300 MG capsule Take 2 capsules (600 mg total) by mouth daily as needed (1-2 capsules).   lisinopril (ZESTRIL) 2.5 MG tablet Take 1 tablet (2.5 mg total) by mouth daily.   meloxicam (MOBIC) 15 MG tablet Take 1 tablet (15 mg total) by mouth daily.   [START ON 06/27/2022] OZEMPIC, 1 MG/DOSE, 4 MG/3ML SOPN Inject 1 mg into the skin every Sunday.   tiZANidine (ZANAFLEX) 4 MG  tablet Take 1 tablet (4 mg total) by mouth every 6 (six) hours as needed for muscle spasms.   [DISCONTINUED] triamcinolone ointment (KENALOG) 0.5 % Apply 1 application topically 2 (two) times daily. (Patient taking differently: Apply 1 application  topically 2 (two) times daily as needed (for eczema.).)   No facility-administered encounter medications on file as of 06/22/2022.    Past Medical History:  Diagnosis Date   Arthritis    Complication of anesthesia    Woke up swinging   COPD (chronic obstructive pulmonary disease) (HCC)    Diabetes mellitus without complication ([place])    History of kidney stones    Hypertension    HX OF 1 YEAR AGO  NEVER TOOK MEDS FOR   Obesity    Sleep apnea    Tobacco use     Past Surgical History:  Procedure Laterality Date   ANTERIOR CERVICAL DECOMP/DISCECTOMY FUSION N/A 07/04/2017   Procedure: C5-6, C6-7, ANTERIOR CERVICAL DECOMPRESSION/DISCECTOMY FUSION,  ALLOGRAFT PLATE;  Surgeon: Marybelle Killings, MD;  Location: [facility];  Service: Orthopedics;  Laterality: N/A;   APPENDECTOMY     back ablation  2023   Done at [facility]     years ago    [place] Right    CARPAL TUNNEL RELEASE Left 07/04/2017   Procedure: LEFT CARPAL TUNNEL RELEASE;  Surgeon: Marybelle Killings, MD;  Location: [facility];  Service: Orthopedics;  Laterality: Left;   CHOLECYSTECTOMY     COLONOSCOPY N/A 01/04/2017   Procedure: COLONOSCOPY;  Surgeon: Doran Stabler, MD;  Location: [facility];  Service: Gastroenterology;  Laterality: N/A;   KNEE SURGERY Right    REPLACEMENT TOTAL KNEE Right 04/26/2022   Dr Lorin [facility]   TOTAL KNEE ARTHROPLASTY Right 04/26/2022   Procedure: RIGHT TOTAL KNEE ARTHROPLASTY;  Surgeon: Marybelle Killings, MD;  Location: [facility];  Service: Orthopedics;  Laterality: Right;  RNFA REQUESTED    Family History  Problem Relation Age of Onset   Diabetes Mother    Arthritis Mother    AAA (abdominal aortic aneurysm) Mother    Intellectual disability Father    Arthritis Father    AAA (abdominal aortic aneurysm) Father    Heart defect Maternal Grandmother    Colon cancer Neg Hx    Esophageal cancer Neg Hx    Pancreatic cancer Neg Hx    Rectal cancer Neg Hx    Stomach cancer Neg Hx     Social History   Socioeconomic History   Marital status: Single    Spouse name: Not on file   Number of children: Not on file   Years of education: Not on file   Highest education level: Not on file  Occupational History   Not on file  Tobacco Use   Smoking status: Former    Packs/day: 1.00    Years: 30.00    Total pack years: 30.00    Types: Cigarettes    Quit date: 2018    Years since  quitting: 6.2   Smokeless tobacco: Never   Tobacco comments:    QUIT October 18 2016  Vaping Use   Vaping Use: Never used  Substance and Sexual Activity   Alcohol use: No    Alcohol/week: 0.0 standard drinks of alcohol   Drug use: No   Sexual activity: Not Currently  Other Topics Concern   Not on file  Social History Narrative   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    Social Determinants of Health   Financial Resource Strain: Not on file  Food Insecurity: Not on file  Transportation Needs: Not on file  Physical Activity: Not on file  Stress: Not on file  Social Connections: Not on file  Intimate Partner Violence: Not on file    Review of Systems  All other systems reviewed and are negative.       Objective    BP 124/82 (BP Location: Left Arm, Patient Position: Sitting, Cuff Size: Large)   Pulse 75   Temp 98.2 F (36.8 C) (Oral)   Ht 5' 7.75" (1.721 m)   Wt (!) 304 lb 6.4 oz (138.1 kg)   SpO2 98%   BMI 46.63 kg/m   Physical Exam Vitals reviewed.  Constitutional:      Appearance: Normal appearance. He is well-groomed. He is morbidly obese.  HENT:     Mouth/Throat:     Mouth: Mucous membranes are moist.     Pharynx: No posterior oropharyngeal erythema.  Eyes:     Extraocular Movements: Extraocular movements intact.     Conjunctiva/sclera: Conjunctivae normal.  Neck:     Thyroid: No thyromegaly.  Cardiovascular:     Rate and Rhythm: Normal rate and regular rhythm.     Heart sounds: S1 normal and S2 normal. No murmur heard. Pulmonary:     Effort: Pulmonary effort is normal.     Breath sounds: Normal breath sounds and air entry. No rales.  Abdominal:     General: Bowel sounds are normal.  Musculoskeletal:     Right lower leg: No edema.     Left lower leg: No edema.  Neurological:     General: No focal deficit present.     Mental Status: He is alert and oriented to person,  place, and time.     Gait: Gait is intact.  Psychiatric:        Mood and Affect: Mood and affect normal.     Last metabolic panel Lab Results  Component Value Date   GLUCOSE 131 (H) 04/28/2022   NA 133 (L) 04/28/2022   K 4.2 04/28/2022   CL 99 04/28/2022   CO2 26 04/28/2022   BUN 12 04/28/2022   CREATININE 1.28 (H) 04/28/2022   GFRNONAA >60 04/28/2022   CALCIUM 8.2 (L) 04/28/2022   PROT 6.6 06/28/2017   ALBUMIN 3.3 (L) 06/28/2017   BILITOT 0.3 06/28/2017   ALKPHOS 74 06/28/2017   AST 24 06/28/2017   ALT 26 06/28/2017   ANIONGAP 8 04/28/2022   Last hemoglobin A1c Lab Results  Component Value Date   HGBA1C 7.2 (H) 04/20/2022        Assessment & Plan:   Problem List Items Addressed This Visit       Unprioritized   Type 2 diabetes mellitus without complication, without long-term current use of insulin (HCC) - Primary (Chronic)    On Ozempic 1 mg weekly. I have refilled this medication, reviewed his last A1C which was well controlled. Will see him back in 3 months for repeat A1C.       Relevant Medications   OZEMPIC, 1 MG/DOSE, 4 MG/3ML SOPN (Start on 06/27/2022)   ondansetron (ZOFRAN-ODT) 4 MG disintegrating tablet   lisinopril (ZESTRIL) 2.5 MG tablet   Essential hypertension (Chronic)    Current hypertension medications:       Sig   lisinopril (ZESTRIL) 2.5 MG  tablet Take 1 tablet (2.5 mg total) by mouth daily.     BP is well controlled, will continue the above medications.       Relevant Medications   lisinopril (ZESTRIL) 2.5 MG tablet   Chronic bilateral low back pain without sciatica    Sees physical medicine Dr. Colbert Coyer, is exercising more and taking tizanidine 4 mg every 6 hours PRN, gabapentin 600 mg daily and meloxicam 15 mg daily. Will refill these medications for him today. States his pain is tolerable with these and the injections      Relevant Medications   tiZANidine (ZANAFLEX) 4 MG tablet   gabapentin (NEURONTIN) 300 MG capsule   meloxicam  (MOBIC) 15 MG tablet    Return in about 3 months (around 09/22/2022) for DM.   Farrel Conners, MD

## 2022-06-22 NOTE — Patient Instructions (Addendum)
Bowel Regimen  Miralax 1 scoop daily  Probiotics 1 tablet once or twice a day  Try metamucil packets once daily

## 2022-06-23 DIAGNOSIS — G8929 Other chronic pain: Secondary | ICD-10-CM | POA: Insufficient documentation

## 2022-06-23 NOTE — Assessment & Plan Note (Signed)
Current hypertension medications:       Sig   lisinopril (ZESTRIL) 2.5 MG tablet Take 1 tablet (2.5 mg total) by mouth daily.      BP is well controlled, will continue the above medications.

## 2022-06-23 NOTE — Assessment & Plan Note (Signed)
On Ozempic 1 mg weekly. I have refilled this medication, reviewed his last A1C which was well controlled. Will see him back in 3 months for repeat A1C.

## 2022-06-23 NOTE — Assessment & Plan Note (Signed)
Sees physical medicine Dr. Colbert Coyer, is exercising more and taking tizanidine 4 mg every 6 hours PRN, gabapentin 600 mg daily and meloxicam 15 mg daily. Will refill these medications for him today. States his pain is tolerable with these and the injections

## 2022-06-29 ENCOUNTER — Ambulatory Visit: Payer: Medicare Other | Admitting: Family Medicine

## 2022-07-07 ENCOUNTER — Ambulatory Visit: Payer: Medicare Other | Admitting: Orthopaedic Surgery

## 2022-07-13 ENCOUNTER — Ambulatory Visit: Payer: Medicare Other | Admitting: Orthopaedic Surgery

## 2022-07-16 ENCOUNTER — Telehealth: Payer: Self-pay | Admitting: Family Medicine

## 2022-07-16 NOTE — Telephone Encounter (Signed)
Called patient to schedule Medicare Annual Wellness Visit (AWV). Left message for patient to call back and schedule Medicare Annual Wellness Visit (AWV).  Last date of AWV: awvi 05/13/20 per [facility]   Please schedule an appointment at any time with [facility] or [facility].  If any questions, please contact me at [phone].  Thank you ,  Rudell Cobb AWV direct phone # [phone]

## 2022-07-26 ENCOUNTER — Encounter: Payer: Self-pay | Admitting: Cardiology

## 2022-07-26 ENCOUNTER — Ambulatory Visit: Payer: Medicare HMO | Attending: Cardiology | Admitting: Cardiology

## 2022-07-26 VITALS — BP 124/62 | HR 76 | Ht 68.0 in | Wt 302.0 lb

## 2022-07-26 DIAGNOSIS — Z8249 Family history of ischemic heart disease and other diseases of the circulatory system: Secondary | ICD-10-CM

## 2022-07-26 DIAGNOSIS — R0609 Other forms of dyspnea: Secondary | ICD-10-CM

## 2022-07-26 DIAGNOSIS — R69 Illness, unspecified: Secondary | ICD-10-CM | POA: Diagnosis not present

## 2022-07-26 DIAGNOSIS — I10 Essential (primary) hypertension: Secondary | ICD-10-CM | POA: Diagnosis not present

## 2022-07-26 DIAGNOSIS — G4733 Obstructive sleep apnea (adult) (pediatric): Secondary | ICD-10-CM | POA: Diagnosis not present

## 2022-07-26 DIAGNOSIS — Z6841 Body Mass Index (BMI) 40.0 and over, adult: Secondary | ICD-10-CM | POA: Diagnosis not present

## 2022-07-26 DIAGNOSIS — E8881 Metabolic syndrome: Secondary | ICD-10-CM

## 2022-07-26 DIAGNOSIS — J438 Other emphysema: Secondary | ICD-10-CM | POA: Diagnosis not present

## 2022-07-26 DIAGNOSIS — F17210 Nicotine dependence, cigarettes, uncomplicated: Secondary | ICD-10-CM

## 2022-07-26 DIAGNOSIS — E785 Hyperlipidemia, unspecified: Secondary | ICD-10-CM

## 2022-07-26 DIAGNOSIS — E1169 Type 2 diabetes mellitus with other specified complication: Secondary | ICD-10-CM | POA: Diagnosis not present

## 2022-07-26 NOTE — Progress Notes (Signed)
Primary Care Provider: Karie Georges, MD [facility] Cardiologist: Bryan Lemma, MD Electrophysiologist: None  Clinic Note: Chief Complaint  Patient presents with   Follow-up    Postop evaluation to discuss cardiovascular risk stratification    ===================================  ASSESSMENT/PLAN   Problem List Items Addressed This Visit       Cardiology Problems   Hyperlipidemia associated with type 2 diabetes mellitus - Primary (Chronic)    Last LDL was 102 diabetes would probably want LDL less than 100.  Not currently on statin.  Recommended Coronary Calcium Score for risk stratification, but I will wait until I see the results of his 2D echocardiogram as it may change the plan to a more definitive ischemic evaluation if there are abnormalities noted.      Essential hypertension (Chronic)    BP looks pretty stable on lisinopril.        Other   Other emphysema (Chronic)    Clearly this is contributing to some of his dyspnea along with deconditioning and obesity.  Will check 2D echo just to ensure no wall motion normalities or any evidence of pulm hypertension with OSA and COPD.      OSA on CPAP (Chronic)   Relevant Orders   ECHOCARDIOGRAM COMPLETE   Morbid obesity with BMI of 45.0-49.9, adult (Chronic)    The patient understands the need to lose weight with diet and exercise. We have discussed specific strategies for this. He has lost about 8 pounds since November.  Hopefully as he is able to walk more & increase Ozempic he will be able to loose more      Relevant Orders   ECHOCARDIOGRAM COMPLETE   VAS [place] AAA DUPLEX   Metabolic syndrome (Chronic)    Individual factors discussed.  This is increased risk factor for CAD.  As noted, will consider Coronary Calcium Score evaluation versus Coronary CTA based on results of Echocardiogram.      Heavy smoker (more than 20 cigarettes per day)   Family history of coronary artery disease in maternal  grandmother (Chronic)    Family history is actually not in primary relatives, but still they are with AAA and stroke in his mother.  His father also AAA paternal grandmother had CAD.  Plan will be to even consider Coronary Calcium Score for risk ratification versus more definitive evaluation with Coronary CTA if echocardiogram shows abnormalities.      Relevant Orders   ECHOCARDIOGRAM COMPLETE   VAS [place] AAA DUPLEX   Family history of abdominal aortic aneurysm (AAA) (Chronic)    He does have risk factors of hypertension hyperlipidemia and diabetes as well as prior smoking and significant family history.  At 67 years old, reasonable to screen with AAA Doppler.      Relevant Orders   ECHOCARDIOGRAM COMPLETE   VAS [place] AAA DUPLEX   Dyspnea on exertion    Likely factorial weight, deconditioning and COPD is factors, but will assess cardiac function with 2D echo.  Based on that result, would consider more detailed ischemic evaluation versus simply coronary calcium scoring for risk stratification.      ===================================  HPI:    Jerome Sims is a 67 y.o. morbidly obese male (former smoker) with a PMH notable for HTN, DM type II, OSA (did not tolerate CPAP), as well as former drug use (cocaine, marijuana, barbiturates, etc. -> now [facility]) below who presents today for 83-month/postop follow-up at the request of Shirline Frees, NP.  Jerome Sims was seen  for preop evaluation on February 15, 2022 -> he was referred for preop cardiology evaluation (Right Knee Arthroplasty) because he had been seen previously for atypical chest pain.  This was probably related to GI upset from taking Ozempic.  He was noted to have hypertension.  Exacerbated by NSAIDs.  His father had a history of AAA rupture and mother COPD and AAA.  Mother also had stroke. => Mostly because of deconditioning and obesity, he noted some exertional dyspnea as well as some mild end of day lower extremity edema. =>  Based on his ability to achieve more than 4 METS without any active chest pain or pressure symptoms, that chest discomfort he had with Ozempic was felt to be noncardiac in nature.  I recommended that he proceed with surgery without any further cardiac evaluation.  Plan was for him to follow-up after surgery to discuss Cardiovascular Risk Stratification  Recent Hospitalizations:  04/26/2022: R Knee TKA  Reviewed  CV studies:    The following studies were reviewed today: (if available, images/films reviewed: From Epic Chart or Care Everywhere) None:  Interval History:   Jerome Sims returns here today for follow-up after his knee surgery.  He is getting back and activity now.  The knee feels a whole lot better.  He is gradually increased his walking.  He does at least 1/4 mile at a time when to stop to rest but is now worked himself up to half a mile lately.  He does have some exertional dyspnea when he overdoes it, but not with routine activity.  No chest pain or pressure with rest or exertion.  No resting dyspnea and only notes dyspnea on exertion if he overdoes it.  No PND, orthopnea but still has some mild lower extremity swelling more on the right since his surgery.  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  No longer driving "over the road".  He admittedly is not as active as he probably should be but has started being more conscious of this and has been doing more walking trying to rehab his knee.  He is open this will help with some weight loss.  He is concerned about his family history of AAA and both his mother and father and then potential coronary artery disease.  He is also now finally starting to tolerate Ozempic and they are hoping to potentially increase to the next dose over the next month or so and he is over that well for weight loss as well.  CV Review of Symptoms (Summary): positive for - dyspnea on exertion and edema negative for - chest pain,  irregular heartbeat, orthopnea, palpitations, paroxysmal nocturnal dyspnea, rapid heart rate, shortness of breath, or syncope/near syncope, TIA/amaurosis fugax, claudication  REVIEWED OF SYSTEMS   Review of Systems  Constitutional:  Positive for malaise/fatigue (Mostly because of deconditioning but getting better with right knee feeling better.). Negative for weight loss.  HENT:  Positive for congestion.   Cardiovascular:  Negative for chest pain.  Gastrointestinal:  Negative for blood in stool and melena.  Genitourinary:  Negative for hematuria.  Musculoskeletal:  Positive for back pain and joint pain (Right knee is still stiff but doing better.).  Neurological:  Negative for dizziness, focal weakness and weakness.  Psychiatric/Behavioral:  Negative for depression and memory loss. The patient is not nervous/anxious and does not have insomnia.    I have reviewed and (if needed) personally updated the patient's problem list, medications, allergies, past medical  and surgical history, social and family history.   PAST MEDICAL HISTORY   Past Medical History:  Diagnosis Date   Arthritis    Complication of anesthesia    Woke up swinging   COPD (chronic obstructive pulmonary disease)    Diabetes mellitus without complication    History of kidney stones    Hypertension    HX OF 1 YEAR AGO NEVER TOOK MEDS FOR   Obesity    Sleep apnea    Tobacco use     PAST SURGICAL HISTORY   Past Surgical History:  Procedure Laterality Date   ANTERIOR CERVICAL DECOMP/DISCECTOMY FUSION N/A 07/04/2017   Procedure: C5-6, C6-7, ANTERIOR CERVICAL DECOMPRESSION/DISCECTOMY FUSION,  ALLOGRAFT PLATE;  Surgeon: Eldred Manges, MD;  Location: [facility] OR;  Service: Orthopedics;  Laterality: N/A;   APPENDECTOMY     back ablation  2023   Done at [facility]   CARDIAC CATHETERIZATION     years ago    CARPAL TUNNEL RELEASE Right    CARPAL TUNNEL RELEASE Left 07/04/2017   Procedure: LEFT CARPAL TUNNEL RELEASE;  Surgeon:  Eldred Manges, MD;  Location: [facility] OR;  Service: Orthopedics;  Laterality: Left;   CHOLECYSTECTOMY     COLONOSCOPY N/A 01/04/2017   Procedure: COLONOSCOPY;  Surgeon: Sherrilyn Rist, MD;  Location: [facility] ENDOSCOPY;  Service: Gastroenterology;  Laterality: N/A;   KNEE SURGERY Right    REPLACEMENT TOTAL KNEE Right 04/26/2022   Dr Ophelia Charter   TOTAL KNEE ARTHROPLASTY Right 04/26/2022   Procedure: RIGHT TOTAL KNEE ARTHROPLASTY;  Surgeon: Eldred Manges, MD;  Location: [facility] OR;  Service: Orthopedics;  Laterality: Right;  RNFA REQUESTED   MEDICATIONS/ALLERGIES   Current Meds  Medication Sig   aspirin (BAYER ASPIRIN) 325 MG tablet Take 1 tablet (325 mg total) by mouth daily. Take one aspirin a day for one month then stop   gabapentin (NEURONTIN) 300 MG capsule Take 2 capsules (600 mg total) by mouth daily as needed (1-2 capsules).   glucose blood (ONETOUCH VERIO) test strip USE TO TEST BLOOD GLUCOSE ONCE DAILY   Lancets (ONETOUCH ULTRASOFT) lancets USE TO TEST BLOOD GLUCOSE ONCE DAILY   lisinopril (ZESTRIL) 2.5 MG tablet Take 1 tablet (2.5 mg total) by mouth daily.   meloxicam (MOBIC) 15 MG tablet Take 1 tablet (15 mg total) by mouth daily.   ondansetron (ZOFRAN-ODT) 4 MG disintegrating tablet Take 1 tablet (4 mg total) by mouth every 8 (eight) hours as needed for nausea or vomiting (for nausea from wegovy or other source).   OZEMPIC, 1 MG/DOSE, 4 MG/3ML SOPN Inject 1 mg into the skin every Sunday.   tiZANidine (ZANAFLEX) 4 MG tablet Take 1 tablet (4 mg total) by mouth every 6 (six) hours as needed for muscle spasms.    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    In his early 90s he was a Candidate but never "Patched" for [employer] --> that is when he was in involved in PSA  ->:   OBJCTIVE -PE, EKG, labs   Wt Readings from Last 3 Encounters:  07/27/22 (!) 302 lb (137 kg)  07/26/22 (!) 302 lb (137 kg)  06/22/22 (!) 304 lb 6.4 oz (138.1 kg)  02/15/2022: 312 lb 3.2 oz (141.6 kg)  Physical Exam: BP 124/62   Pulse 76   Ht 5\' 8"  (1.727 m)   Wt (!) 302 lb (137 kg)   SpO2 95%   BMI 45.92 kg/m  Physical Exam Vitals reviewed.  Constitutional:      General: He is not in acute distress.    Appearance: Normal appearance. He is obese. He is not ill-appearing or toxic-appearing.  HENT:     Head: Normocephalic and atraumatic.  Neck:     Vascular: No carotid bruit or JVD.  Cardiovascular:     Rate and Rhythm: Normal rate and regular rhythm. No extrasystoles are present.    Chest Wall: PMI is not displaced (Unable to assess).     Pulses: Intact distal pulses. Decreased pulses (Decreased due to body habitus-mild edema).     Heart sounds: S1 normal and S2 normal. Heart sounds are distant. No murmur heard.    No friction rub. No gallop.  Pulmonary:     Effort: Pulmonary effort is normal. No respiratory distress.     Breath sounds: No wheezing, rhonchi or rales.  Musculoskeletal:        General: Swelling present.     Cervical back: Normal range of motion and neck supple.     Right lower leg: Edema (1+) present.     Left lower leg: Edema (Trace) present.  Skin:    General: Skin is warm and dry.  Neurological:     General: No focal deficit present.     Mental Status: He is alert and oriented to person, place, and time.      Gait: Gait abnormal (Less prominent antalgic gait.).  Psychiatric:        Mood and Affect: Mood normal.        Behavior: Behavior normal.        Thought Content: Thought content normal.        Judgment: Judgment normal.     Adult ECG Report Not checked  Recent Labs: No lipids since 01/07/2021: TC 155, TG 109, HDL 31, LDL 102. Lab Results  Component Value Date   CREATININE 1.28 (H) 04/28/2022   BUN 12 04/28/2022   NA 133 (L) 04/28/2022   K 4.2 04/28/2022   CL 99 04/28/2022   CO2 26 04/28/2022      Latest Ref Rng & Units 04/28/2022    7:22 PM 04/20/2022    4:15 PM  CBC  WBC 4.0 - 10.5 K/uL 9.1  7.7   Hemoglobin 13.0 - 17.0 g/dL 16.1  09.6   Hematocrit 39.0 - 52.0 % 33.5  40.8   Platelets 150 - 400 K/uL 186  230     Lab Results  Component Value Date   HGBA1C 7.2 (H) 04/20/2022     ================================================== I spent a total of with the patient spent in direct patient consultation.  Additional time  spent with chart review  / charting (studies, outside notes, etc): 14 min Total Time: 31 min  Current medicines are reviewed at length with the patient today.  (+/- concerns) N/A  Notice: This dictation was prepared with Dragon dictation along with smart phrase technology. Any transcriptional errors that result from this process are unintentional and may not be corrected upon review.  Studies Ordered:   Orders Placed This Encounter  Procedures   ECHOCARDIOGRAM COMPLETE   VAS [place] AAA DUPLEX   No orders of the defined types were placed in this encounter.   Patient Instructions / Medication Changes & Studies & Tests Ordered   Patient Instructions  Medication Instructions:   No changes  *If you need a refill on your cardiac medications before your next appointment, please call your pharmacy*   Lab Work: Not needed    Testing/Procedures:  Will be schedule at [address] Your physician has requested that you have  an abdominal aorta duplex. During this test, an ultrasound is used to evaluate the aorta. Allow 30 minutes for this exam. Do not eat after midnight the day before and avoid carbonated beverages    And  Will be schedule at [facility] [address] Your physician has requested that you have an echocardiogram. Echocardiography is a painless test that uses sound waves to create images of your heart. It provides your doctor with information about the size and shape of your heart and how well your heart's chambers and valves are working. This procedure takes approximately one hour. There are no restrictions for this procedure. Please do NOT wear cologne, perfume, aftershave, or lotions (deodorant is allowed). Please arrive 15 minutes prior to your appointment time.   Follow-Up: At [facility], you and your health needs are our priority.  As part of our continuing mission to provide you with exceptional heart care, we have created designated Provider Care Teams.  These Care Teams include your primary Cardiologist (physician) and Advanced Practice Providers (APPs -  Physician Assistants and Nurse Practitioners) who all work together to provide you with the care you need, when you need it.     Your next appointment:   6 month(s)  Call in late May /June for appt in Oct 2024   The format for your next appointment:   In Person  Provider:   Bryan Lemma, MD     Marykay Lex, MD, MS Bryan Lemma, M.D., M.S. Interventional Cardiologist  [facility]  Pager # [phone] Phone # [phone] [address] [place], [place] [address]   Thank you for choosing [facility] [place]!!

## 2022-07-26 NOTE — Patient Instructions (Addendum)
Medication Instructions:   No changes  *If you need a refill on your cardiac medications before your next appointment, please call your pharmacy*   Lab Work: Not needed    Testing/Procedures:  Will be schedule at [address] Your physician has requested that you have an abdominal aorta duplex. During this test, an ultrasound is used to evaluate the aorta. Allow 30 minutes for this exam. Do not eat after midnight the day before and avoid carbonated beverages    And  Will be schedule at [facility] [address] Your physician has requested that you have an echocardiogram. Echocardiography is a painless test that uses sound waves to create images of your heart. It provides your doctor with information about the size and shape of your heart and how well your heart's chambers and valves are working. This procedure takes approximately one hour. There are no restrictions for this procedure. Please do NOT wear cologne, perfume, aftershave, or lotions (deodorant is allowed). Please arrive 15 minutes prior to your appointment time.   Follow-Up: At [facility], you and your health needs are our priority.  As part of our continuing mission to provide you with exceptional heart care, we have created designated Provider Care Teams.  These Care Teams include your primary Cardiologist (physician) and Advanced Practice Providers (APPs -  Physician Assistants and Nurse Practitioners) who all work together to provide you with the care you need, when you need it.     Your next appointment:   6 month(s)  Call in late May /June for appt in Oct 2024   The format for your next appointment:   In Person  Provider:   Bryan Lemma, MD

## 2022-07-27 ENCOUNTER — Encounter: Payer: Self-pay | Admitting: Orthopaedic Surgery

## 2022-07-27 ENCOUNTER — Ambulatory Visit: Payer: Medicare HMO | Admitting: Orthopaedic Surgery

## 2022-07-27 VITALS — BP 140/76 | HR 74 | Ht 68.0 in | Wt 302.0 lb

## 2022-07-27 DIAGNOSIS — Z96651 Presence of right artificial knee joint: Secondary | ICD-10-CM

## 2022-07-27 NOTE — Progress Notes (Signed)
Post-Op Visit Note   Patient: Jerome Sims           Date of Birth: January 28, 1956           MRN: [id number] Visit Date: 07/27/2022 PCP: Karie Georges, MD   Assessment & Plan: Postop right total knee arthroplasty.  Full extension flexion 115 mild hop when he gets up on a step right foot first.  Back pain significantly better which previously had facet injections and rhizotomy right L4-5 facet joint done in [facility].  He states that 5 minutes closer to [facility] and he preferred to come here for follow-up.  Backs actually doing better since his knee replacement since he is walking better and is losing weight.  Walking a mile and a quarter.  Recheck 3 months.  He is happy with the results of surgery.  Opposite left knee is doing fairly well at this point and did not have his severe arthritis.  Chief Complaint:  Chief Complaint  Patient presents with   Right Knee - Follow-up    04/26/2022 Right TKA   Visit Diagnoses: No diagnosis found.  Plan: Return in 3 months.  Follow-Up Instructions: Return in about 3 months (around 10/26/2022).   Orders:  No orders of the defined types were placed in this encounter.  No orders of the defined types were placed in this encounter.   Imaging: No results found.  PMFS History: Patient Active Problem List   Diagnosis Date Noted   Family history of abdominal aortic aneurysm (AAA) 07/26/2022   Family history of coronary artery disease in maternal grandmother 07/26/2022   Chronic bilateral low back pain without sciatica 06/23/2022   S/P total knee arthroplasty 04/26/2022   Essential hypertension 02/21/2022   Hyperlipidemia associated with type 2 diabetes mellitus 02/21/2022   Preoperative cardiovascular examination 02/21/2022   S/P cervical spinal fusion 07/15/2017   Type 2 diabetes mellitus without complication, without long-term current use of insulin 05/31/2017   Left hip pain 03/24/2017   Carpal tunnel syndrome of left wrist  12/15/2016   Kidney stones 09/23/2016   Heavy smoker (more than 20 cigarettes per day) 09/21/2016   Sun-damaged skin 09/21/2016   Morbid obesity with BMI of 45.0-49.9, adult 09/20/2016   OSA on CPAP 09/20/2016   Other emphysema 09/20/2016   Past Medical History:  Diagnosis Date   Arthritis    Complication of anesthesia    Woke up swinging   COPD (chronic obstructive pulmonary disease)    Diabetes mellitus without complication    History of kidney stones    Hypertension    HX OF 1 YEAR AGO NEVER TOOK MEDS FOR   Obesity    Sleep apnea    Tobacco use     Family History  Problem Relation Age of Onset   Diabetes Mother    Arthritis Mother    AAA (abdominal aortic aneurysm) Mother    Intellectual disability Father    Arthritis Father    AAA (abdominal aortic aneurysm) Father    Heart defect Maternal Grandmother    Colon cancer Neg Hx    Esophageal cancer Neg Hx    Pancreatic cancer Neg Hx    Rectal cancer Neg Hx    Stomach cancer Neg Hx     Past Surgical History:  Procedure Laterality Date   ANTERIOR CERVICAL DECOMP/DISCECTOMY FUSION N/A 07/04/2017   Procedure: C5-6, C6-7, ANTERIOR CERVICAL DECOMPRESSION/DISCECTOMY FUSION,  ALLOGRAFT PLATE;  Surgeon: Eldred Manges, MD;  Location: [facility] OR;  Service: Orthopedics;  Laterality: N/A;   APPENDECTOMY     back ablation  2023   Done at [facility]   CARDIAC CATHETERIZATION     years ago    CARPAL TUNNEL RELEASE Right    CARPAL TUNNEL RELEASE Left 07/04/2017   Procedure: LEFT CARPAL TUNNEL RELEASE;  Surgeon: Eldred Manges, MD;  Location: [facility] OR;  Service: Orthopedics;  Laterality: Left;   CHOLECYSTECTOMY     COLONOSCOPY N/A 01/04/2017   Procedure: COLONOSCOPY;  Surgeon: Sherrilyn Rist, MD;  Location: [facility] END[facility]COPY;  Service: Gastroenterology;  Laterality: N/A;   KNEE SURGERY Right    REPLACEMENT TOTAL KNEE Right 04/26/2022   Dr Ophelia Charter   TOTAL KNEE ARTHROPLASTY Right 04/26/2022   Procedure: RIGHT TOTAL KNEE ARTHROPLASTY;  Surgeon:  Eldred Manges, MD;  Location: [facility] OR;  Service: Orthopedics;  Laterality: Right;  RNFA REQUESTED   Social History   Occupational History   Not on file  Tobacco Use   Smoking status: Former    Packs/day: 1.00    Years: 30.00    Additional pack years: 0.00    Total pack years: 30.00    Types: Cigarettes    Quit date: 2018    Years since quitting: 6.2   Smokeless tobacco: Never   Tobacco comments:    QUIT October 18 2016  Vaping Use   Vaping Use: Never used  Substance and Sexual Activity   Alcohol use: No    Alcohol/week: 0.0 standard drinks of alcohol   Drug use: No   Sexual activity: Not Currently

## 2022-07-27 NOTE — Progress Notes (Deleted)
Office Visit Note   Patient: Jerome Sims           Date of Birth: 1955-09-21           MRN: [id number] Visit Date: 07/27/2022              Requested by: Shirline Frees, NP [address] [place],  [place] [address] PCP: Karie Georges, MD   Assessment & Plan: Visit Diagnoses: No diagnosis found.  Plan: ***  Follow-Up Instructions: Return in about 3 months (around 10/26/2022).   Orders:  No orders of the defined types were placed in this encounter.  No orders of the defined types were placed in this encounter.     Procedures: No procedures performed   Clinical Data: No additional findings.   Subjective: Chief Complaint  Patient presents with   Right Knee - Follow-up    04/26/2022 Right TKA    HPI  Review of Systems   Objective: Vital Signs: BP (!) 140/76   Pulse 74   Ht  (1.727 m)   Wt (!) 302 lb (137 kg)   BMI 45.92 kg/m   Physical Exam  Ortho Exam  Specialty Comments:  No specialty comments available.  Imaging: No results found.   PMFS History: Patient Active Problem List   Diagnosis Date Noted   Family history of abdominal aortic aneurysm (AAA) 07/26/2022   Family history of coronary artery disease in maternal grandmother 07/26/2022   Chronic bilateral low back pain without sciatica 06/23/2022   S/P total knee arthroplasty 04/26/2022   Essential hypertension 02/21/2022   Hyperlipidemia associated with type 2 diabetes mellitus 02/21/2022   Preoperative cardiovascular examination 02/21/2022   S/P cervical spinal fusion 07/15/2017   Type 2 diabetes mellitus without complication, without long-term current use of insulin 05/31/2017   Left hip pain 03/24/2017   Carpal tunnel syndrome of left wrist 12/15/2016   Kidney stones 09/23/2016   Heavy smoker (more than 20 cigarettes per day) 09/21/2016   Sun-damaged skin 09/21/2016   Morbid obesity with BMI of 45.0-49.9, adult 09/20/2016   OSA on CPAP 09/20/2016   Other emphysema  09/20/2016   Past Medical History:  Diagnosis Date   Arthritis    Complication of anesthesia    Woke up swinging   COPD (chronic obstructive pulmonary disease)    Diabetes mellitus without complication    History of kidney stones    Hypertension    HX OF 1 YEAR AGO NEVER TOOK MEDS FOR   Obesity    Sleep apnea    Tobacco use     Family History  Problem Relation Age of Onset   Diabetes Mother    Arthritis Mother    AAA (abdominal aortic aneurysm) Mother    Intellectual disability Father    Arthritis Father    AAA (abdominal aortic aneurysm) Father    Heart defect Maternal Grandmother    Colon cancer Neg Hx    Esophageal cancer Neg Hx    Pancreatic cancer Neg Hx    Rectal cancer Neg Hx    Stomach cancer Neg Hx     Past Surgical History:  Procedure Laterality Date   ANTERIOR CERVICAL DECOMP/DISCECTOMY FUSION N/A 07/04/2017   Procedure: C5-6, C6-7, ANTERIOR CERVICAL DECOMPRESSION/DISCECTOMY FUSION,  ALLOGRAFT PLATE;  Surgeon: Eldred Manges, MD;  Location: [facility] OR;  Service: Orthopedics;  Laterality: N/A;   APPENDECTOMY     back ablation  2023   Done at [facility]   CARDIAC CATHETERIZATION  years ago    CARPAL TUNNEL RELEASE Right    CARPAL TUNNEL RELEASE Left 07/04/2017   Procedure: LEFT CARPAL TUNNEL RELEASE;  Surgeon: Eldred Manges, MD;  Location: [facility] OR;  Service: Orthopedics;  Laterality: Left;   CHOLECYSTECTOMY     COLONOSCOPY N/A 01/04/2017   Procedure: COLONOSCOPY;  Surgeon: Sherrilyn Rist, MD;  Location: [facility];  Service: Gastroenterology;  Laterality: N/A;   KNEE SURGERY Right    REPLACEMENT TOTAL KNEE Right 04/26/2022   Dr Ophelia Charter   TOTAL KNEE ARTHROPLASTY Right 04/26/2022   Procedure: RIGHT TOTAL KNEE ARTHROPLASTY;  Surgeon: Eldred Manges, MD;  Location[facility];  Service: Orthopedics;  Laterality: Right;  RNFA REQUESTED   Social History   Occupational History   Not on file  Tobacco Use   Smoking status: Former    Packs/day: 1.00    Years: 30.00     Additional pack years: 0.00    Total pack years: 30.00    Types: Cigarettes    Quit date: 2018    Years since quitting: 6.2   Smokeless tobacco: Never   Tobacco comments:    QUIT October 18 2016  Vaping Use   Vaping Use: Never used  Substance and Sexual Activity   Alcohol use: No    Alcohol/week: 0.0 standard drinks of alcohol   Drug use: No   Sexual activity: Not Currently

## 2022-08-01 ENCOUNTER — Encounter: Payer: Self-pay | Admitting: Cardiology

## 2022-08-01 DIAGNOSIS — E8881 Metabolic syndrome: Secondary | ICD-10-CM | POA: Insufficient documentation

## 2022-08-01 DIAGNOSIS — R0609 Other forms of dyspnea: Secondary | ICD-10-CM | POA: Insufficient documentation

## 2022-08-01 NOTE — Assessment & Plan Note (Signed)
He does have risk factors of hypertension hyperlipidemia and diabetes as well as prior smoking and significant family history.  At 67 years old, reasonable to screen with AAA Doppler.

## 2022-08-01 NOTE — Assessment & Plan Note (Signed)
Clearly this is contributing to some of his dyspnea along with deconditioning and obesity.  Will check 2D echo just to ensure no wall motion normalities or any evidence of pulm hypertension with OSA and COPD.

## 2022-08-01 NOTE — Assessment & Plan Note (Signed)
Individual factors discussed.  This is increased risk factor for CAD.  As noted, will consider Coronary Calcium Score evaluation versus Coronary CTA based on results of Echocardiogram.

## 2022-08-01 NOTE — Assessment & Plan Note (Signed)
Likely factorial weight, deconditioning and COPD is factors, but will assess cardiac function with 2D echo.  Based on that result, would consider more detailed ischemic evaluation versus simply coronary calcium scoring for risk stratification.

## 2022-08-01 NOTE — Assessment & Plan Note (Signed)
The patient understands the need to lose weight with diet and exercise. We have discussed specific strategies for this. He has lost about 8 pounds since November.  Hopefully as he is able to walk more & increase Ozempic he will be able to loose more

## 2022-08-01 NOTE — Assessment & Plan Note (Addendum)
Last LDL was 102 diabetes would probably want LDL less than 100.  Not currently on statin.  Recommended Coronary Calcium Score for risk stratification, but I will wait until I see the results of his 2D echocardiogram as it may change the plan to a more definitive ischemic evaluation if there are abnormalities noted.

## 2022-08-01 NOTE — Assessment & Plan Note (Signed)
BP looks pretty stable on lisinopril.

## 2022-08-01 NOTE — Assessment & Plan Note (Signed)
Family history is actually not in primary relatives, but still they are with AAA and stroke in his mother.  His father also AAA paternal grandmother had CAD.  Plan will be to even consider Coronary Calcium Score for risk ratification versus more definitive evaluation with Coronary CTA if echocardiogram shows abnormalities.

## 2022-08-10 ENCOUNTER — Inpatient Hospital Stay (HOSPITAL_COMMUNITY): Admission: RE | Admit: 2022-08-10 | Payer: Medicare HMO | Source: Ambulatory Visit

## 2022-08-12 ENCOUNTER — Other Ambulatory Visit (HOSPITAL_COMMUNITY): Payer: Self-pay

## 2022-08-23 ENCOUNTER — Telehealth (HOSPITAL_COMMUNITY): Payer: Self-pay | Admitting: Cardiology

## 2022-08-23 NOTE — Telephone Encounter (Signed)
Patient called and cancelled echocardiogram for 08/24/22 for reason below:  08/23/2022 1:56 PM ZO:[id number], YVETTE  Cancel Rsn: Patient (out of town, will c/b to r/s)  Order will be removed from the echo wq. Thank you

## 2022-08-24 ENCOUNTER — Ambulatory Visit (HOSPITAL_COMMUNITY): Payer: Medicare HMO

## 2022-08-25 NOTE — Telephone Encounter (Signed)
OK - not sure why is was scheduled when he was out of town??  DH

## 2022-08-25 NOTE — Telephone Encounter (Signed)
OK ° °DH °

## 2022-09-23 ENCOUNTER — Telehealth: Payer: Medicare HMO | Admitting: Family Medicine

## 2022-09-23 ENCOUNTER — Encounter: Payer: Self-pay | Admitting: Family Medicine

## 2022-09-23 DIAGNOSIS — E119 Type 2 diabetes mellitus without complications: Secondary | ICD-10-CM | POA: Diagnosis not present

## 2022-09-23 DIAGNOSIS — G8929 Other chronic pain: Secondary | ICD-10-CM | POA: Diagnosis not present

## 2022-09-23 DIAGNOSIS — M545 Low back pain, unspecified: Secondary | ICD-10-CM | POA: Diagnosis not present

## 2022-09-23 MED ORDER — SEMAGLUTIDE (2 MG/DOSE) 8 MG/3ML ~~LOC~~ SOPN: 2.0000 mg | PEN_INJECTOR | SUBCUTANEOUS | 5 refills | Status: DC

## 2022-09-23 NOTE — Assessment & Plan Note (Addendum)
Injections working well for his back pain, will continue tizanidine 4 mg every 6 hours PRN for muscle spasms, meloxicam 15 mg daily. Patient is off the gabapentin, states that his back pain is much improved.

## 2022-09-23 NOTE — Assessment & Plan Note (Signed)
Jerome Bible is doing well on the 1 mg, however he wants to reduce his blood sugars further. Will increase to 2 mg weekly and see him back in 6 months for repeat A1C.

## 2022-09-23 NOTE — Progress Notes (Signed)
Virtual Medical Office Visit  Patient:  Jerome Sims      Age: 67 y.o.       Sex:  male  Date:   09/23/2022  PCP:    Karie Georges, MD   Today's Healthcare Provider: Karie Georges, MD    Assessment/Plan:   Summary assessment:  [name] was seen today for medical management of chronic issues.  Type 2 diabetes mellitus without complication, without long-term current use of insulin (HCC) Assessment & Plan: Dennie Bible is doing well on the 1 mg, however he wants to reduce his blood sugars further. Will increase to 2 mg weekly and see him back in 6 months for repeat A1C.   Orders: -     Semaglutide (2 MG/DOSE); Inject 2 mg as directed once a week.  Dispense: 3 mL; Refill: 5  Chronic bilateral low back pain without sciatica Assessment & Plan: Injections working well for his back pain, will continue tizanidine 4 mg every 6 hours PRN for muscle spasms, meloxicam 15 mg daily. Patient is off the gabapentin, states that his back pain is much improved.       Return in about 6 months (around 03/25/2023) for DM.   He was advised to call the office or go to ER if his condition worsens    Subjective:   Jerome Sims is a 67 y.o. male with PMH significant for: Past Medical History:  Diagnosis Date   Arthritis    Complication of anesthesia    Woke up swinging   COPD (chronic obstructive pulmonary disease) (HCC)    Diabetes mellitus without complication (HCC)    History of kidney stones    Hypertension    HX OF 1 YEAR AGO NEVER TOOK MEDS FOR   Obesity    Sleep apnea    Tobacco use      Presenting today with: Chief Complaint  Patient presents with   Medical Management of Chronic Issues    Patient requests to increase dose of Ozempic      He clarifies and reports that his condition: Patient reports he went back to work, states his back is feeling much better, 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 States that his back is feeling very good,  no pain. He would like to continue the tizanidine 4 mg, does not currently need refills.  Patient reports his blood sugars are doing well, states that he continues on the ozempic. States that he would like to increase to the 2 mg weekly dose for additional blood sugar control and weight loss.   He denies having any: Constipation, stomach upset.          Objective/Observations  Physical Exam:  Polite and friendly Gen: NAD, resting comfortably Pulm: Normal work of breathing Neuro: Grossly normal, moves all extremities Psych: Normal affect and thought content Problem specific physical exam findings:    No images are attached to the encounter or orders placed in the encounter.    Results: No results found for any visits on 09/23/22.   No results found for this or any previous visit (from the past 2160 hour(s)).        I spent 20 minutes in this video enabled face to face encounter with the patient today.  Virtual Visit via Video   I connected with Guilford Shi on 09/23/22 at  4:00 PM EDT by a video enabled telemedicine application and verified that I am speaking with the  correct person using two identifiers. The limitations of evaluation and management by telemedicine and the availability of in person appointments were discussed. The patient expressed understanding and agreed to proceed.   Percentage of appointment time on video:  100% Patient location: Home Provider location: [facility] Persons participating in the virtual visit: Myself and Patient

## 2022-10-08 DIAGNOSIS — M545 Low back pain, unspecified: Secondary | ICD-10-CM | POA: Diagnosis not present

## 2022-10-08 DIAGNOSIS — E119 Type 2 diabetes mellitus without complications: Secondary | ICD-10-CM | POA: Diagnosis not present

## 2022-10-08 DIAGNOSIS — G4730 Sleep apnea, unspecified: Secondary | ICD-10-CM | POA: Diagnosis not present

## 2022-10-08 DIAGNOSIS — Z791 Long term (current) use of non-steroidal anti-inflammatories (NSAID): Secondary | ICD-10-CM | POA: Diagnosis not present

## 2022-10-08 DIAGNOSIS — I10 Essential (primary) hypertension: Secondary | ICD-10-CM | POA: Diagnosis not present

## 2022-10-08 DIAGNOSIS — M1990 Unspecified osteoarthritis, unspecified site: Secondary | ICD-10-CM | POA: Diagnosis not present

## 2022-10-08 DIAGNOSIS — F1021 Alcohol dependence, in remission: Secondary | ICD-10-CM | POA: Diagnosis not present

## 2022-10-08 DIAGNOSIS — Z7985 Long-term (current) use of injectable non-insulin antidiabetic drugs: Secondary | ICD-10-CM | POA: Diagnosis not present

## 2022-10-08 DIAGNOSIS — Z87891 Personal history of nicotine dependence: Secondary | ICD-10-CM | POA: Diagnosis not present

## 2022-10-28 ENCOUNTER — Telehealth: Payer: Medicare HMO | Admitting: Family Medicine

## 2022-10-28 DIAGNOSIS — Z0000 Encounter for general adult medical examination without abnormal findings: Secondary | ICD-10-CM | POA: Diagnosis not present

## 2022-10-28 NOTE — Patient Instructions (Addendum)
I really enjoyed getting to talk with you today! I am available on Tuesdays and Thursdays for virtual visits if you have any questions or concerns, or if I can be of any further assistance.   CHECKLIST FROM ANNUAL WELLNESS VISIT:  -Follow up (please call to schedule if not scheduled after visit):   -please schedule in office follow up with Dr. Russella Dar in the next 3 months   -yearly for annual wellness visit with primary care office  Here is a list of your preventive care/health maintenance measures and the plan for each if any are due:  PLAN For any measures below that may be due:  -please check with your insurance and schedule your diabetic eye exam with an eye doctor ASAP -please consider doing the lung cancer screening and call Dr. Ardell Isaacs office if you decide to do this so that we can order it -please let us know if you wish to do the foot exam, diabetes lab, prostate screening, HIV screening, hepatitis C screening   Health Maintenance  Topic Date Due   OPHTHALMOLOGY EXAM  Never done   HIV Screening  Never done   Diabetic kidney evaluation - Urine ACR  Never done   Hepatitis C Screening  Never done   Lung Cancer Screening  Never done   FOOT EXAM  05/31/2018   HEMOGLOBIN A1C  10/19/2022   COVID-19 Vaccine (1 - 2023-24 season) 06/22/2023 (Originally 12/11/2021)   INFLUENZA VACCINE  11/11/2022   Diabetic kidney evaluation - eGFR measurement  04/29/2023   Medicare Annual Wellness (AWV)  10/28/2023   Colonoscopy  01/05/2027   DTaP/Tdap/Td (4 - Td or Tdap) 12/16/2031   Zoster Vaccines- Shingrix  Completed   HPV VACCINES  Aged Out    -See a dentist at least yearly  -Get your eyes checked and then per your eye specialist's recommendations  -Other issues addressed today:   -I have included below further information regarding a healthy whole foods based diet, physical activity guidelines for adults, stress management and opportunities for social connections. I hope you find this  information useful.   -----------------------------------------------------------------------------------------------------------------------------------------------------------------------------------------------------------------------------------------------------------  NUTRITION: -eat real food: lots of colorful vegetables (half the plate) and fruits -5-7 servings of vegetables and fruits per day (fresh or steamed is best), exp. 2 servings of vegetables with lunch and dinner and 2 servings of fruit per day. Berries and greens such as kale and collards are great choices.  -consume on a regular basis: whole grains (make sure first ingredient on label contains the word "whole"), fresh fruits, fish, nuts, seeds, healthy oils (such as olive oil, avocado oil, grape seed oil) -may eat small amounts of dairy and lean meat on occasion, but avoid processed meats such as ham, bacon, lunch meat, etc. -drink water -try to avoid fast food and pre-packaged foods, processed meat -most experts advise limiting sodium to < 2300mg  per day, should limit further is any chronic conditions such as high blood pressure, heart disease, diabetes, etc. The American Heart Association advised that < 1500mg  is is ideal -try to avoid foods that contain any ingredients with names you do not recognize  -try to avoid sugar/sweets (except for the natural sugar that occurs in fresh fruit) -try to avoid sweet drinks -try to avoid white rice, white bread, pasta (unless whole grain), white or yellow potatoes  EXERCISE GUIDELINES FOR ADULTS: -if you wish to increase your physical activity, do so gradually and with the approval of your doctor -STOP and seek medical care immediately if you  have any chest pain, chest discomfort or trouble breathing when starting or increasing exercise  -move and stretch your body, legs, feet and arms when sitting for long periods -Physical activity guidelines for optimal health in adults: -least 150  minutes per week of aerobic exercise (can talk, but not sing) once approved by your doctor, 20-30 minutes of sustained activity or two 10 minute episodes of sustained activity every day.  -resistance training at least 2 days per week if approved by your doctor -balance exercises 3+ days per week:   Stand somewhere where you have something sturdy to hold onto if you lose balance.    1) lift up on toes, start with 5x per day and work up to 20x   2) stand and lift on leg straight out to the side so that foot is a few inches of the floor, start with 5x each side and work up to 20x each side   3) stand on one foot, start with 5 seconds each side and work up to 20 seconds on each side  If you need ideas or help with getting more active:  -Silver sneakers https://tools.silversneakers.com  -Walk with a Doc: http://www.duncan-williams.com/  -try to include resistance (weight lifting/strength building) and balance exercises twice per week: or the following link for ideas: http://castillo-powell.com/  BuyDucts.dk  STRESS MANAGEMENT: -can try meditating, or just sitting quietly with deep breathing while intentionally relaxing all parts of your body for 5 minutes daily -if you need further help with stress, anxiety or depression please follow up with your primary doctor or contact the wonderful folks at [facility]: [phone]  SOCIAL CONNECTIONS: -options in [place] if you wish to engage in more social and exercise related activities:  -Silver sneakers https://tools.silversneakers.com  -Walk with a Doc: http://www.duncan-williams.com/  -Check out the [facility] Active Adults 50+ section on the [place] of [employer] (hiking clubs, book clubs, cards and games, chess, exercise classes, aquatic classes and much more) - see the website for  details: https://www.[place].gov/departments/parks-recreation/active-adults50  -YouTube has lots of exercise videos for different ages and abilities as well  -[facility] (a variety of indoor and outdoor inperson activities for adults). 313-415-3880. [address]  -Virtual Online Classes (a variety of topics): see seniorplanet.org or call ([phone]) [phone]  -consider volunteering at a school, hospice center, church, senior center or elsewhere

## 2022-10-28 NOTE — Progress Notes (Signed)
PATIENT CHECK-IN and HEALTH RISK ASSESSMENT QUESTIONNAIRE:  -completed by phone/video for upcoming Medicare Preventive Visit  Pre-Visit Check-in: 1)Vitals (height, wt, BP, etc) - record in vitals section for visit on day of visit 2)Review and Update Medications, Allergies PMH, Surgeries, Social history in Epic 3)Hospitalizations in the last year with date/reason? none  4)Review and Update Care Team (patient's specialists) in Epic 5) Complete PHQ9 in Epic  6) Complete Fall Screening in Epic 7)Review all Health Maintenance Due and order under PCP if not done.  Medicare Wellness Patient Questionnaire:  Answer theses question about your habits: Do you drink alcohol? no Have you ever smoked?quit smoking 10 years ago How many packs a day do/did you smoke? Heavy smoker over 20 years Do you exercises? Walks about 1/2 mile (15 minutes per day) Diet: Truck driver, brings meals - tuna fish salad, trying   Beverages: flavored waters  Answer theses question about you: Can you perform most household chores?yes Do you find it hard to follow a conversation in a noisy room?not really, does not feel needs audiology eval Do you often ask people to speak up or repeat themselves?no Do you feel that you have a problem with memory?no Do you balance your checkbook and or bank acounts?yes Do you feel safe at home?yes Last dentist visit?regular visits Do you need assistance with any of the following: Please note if so - none  Driving?  Feeding yourself?  Getting from bed to chair?  Getting to the toilet?  Bathing or showering?  Dressing yourself?  Managing money?  Climbing a flight of stairs  Preparing meals?    Do you have Advanced Directives in place (Living Will, Healthcare Power or Attorney)? Working on this currently   Last eye Exam and location? He reports he needs to schedule and used to go [facility] but plans to check his insurance and schedule this year local   Do you currently use  prescribed or non-prescribed narcotic or opioid pain medications?no  Do you have a history or close family history of breast, ovarian, tubal or peritoneal cancer or a family member with BRCA (breast cancer susceptibility 1 and 2) gene mutations?  Nurse/Assistant Credentials/time stamp:   ----------------------------------------------------------------------------------------------------------------------------------------------------------------------------------------------------------------------    MEDICARE ANNUAL PREVENTIVE CARE VISIT WITH PROVIDER (Welcome to Medicare, initial annual wellness or annual wellness exam)  Virtual Visit via Video  Note  I connected with KEEFE ZAWISTOWSKI on 10/28/22  by phone a video enabled telemedicine application and verified that I am speaking with the correct person using two identifiers.  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 Has lost some weight intentionally with ozempic. Eats less since starting it.    See HM section in Epic for other details of completed HM.    ROS: negative for report of fevers, unintentional weight loss, vision changes, vision loss, hearing loss or change, chest pain, sob, hemoptysis, melena, hematochezia, hematuria, falls, bleeding or bruising, thoughts of suicide or self harm, memory loss  Patient-completed extensive health risk assessment - reviewed and discussed with the patient: See Health Risk Assessment completed with patient prior to the visit either above or in recent phone note. This was reviewed in detailed with the patient today and appropriate recommendations, orders and referrals were placed as needed per Summary below and patient instructions.   Review of Medical History: -PMH, PSH, Family History and current specialty and care providers reviewed  and updated and  listed below   Patient Care Team: Karie Georges, MD as PCP - General (Family Medicine) Marykay Lex, MD as PCP - Cardiology (Cardiology) Myrtie Neither Andreas Blower, MD as Consulting Physician (Gastroenterology) Eldred Manges, MD as Consulting Physician (Orthopedic Surgery)   Past Medical History:  Diagnosis Date   Arthritis    Complication of anesthesia    Woke up swinging   COPD (chronic obstructive pulmonary disease) (HCC)    Diabetes mellitus without complication (HCC)    History of kidney stones    Hypertension    HX OF 1 YEAR AGO NEVER TOOK MEDS FOR   Obesity    Sleep apnea    Tobacco use     Past Surgical History:  Procedure Laterality Date   ANTERIOR CERVICAL DECOMP/DISCECTOMY FUSION N/A 07/04/2017   Procedure: C5-6, C6-7, ANTERIOR CERVICAL DECOMPRESSION/DISCECTOMY FUSION,  ALLOGRAFT PLATE;  Surgeon: Eldred Manges, MD;  Location: [facility] OR;  Service: Orthopedics;  Laterality: N/A;   APPENDECTOMY     back ablation  2023   Done at [facility]   CARDIAC CATHETERIZATION     years ago    CARPAL TUNNEL RELEASE Right    CARPAL TUNNEL RELEASE Left 07/04/2017   Procedure: LEFT CARPAL TUNNEL RELEASE;  Surgeon: Eldred Manges, MD;  Location: [facility] OR;  Service: Orthopedics;  Laterality: Left;   CHOLECYSTECTOMY     COLONOSCOPY N/A 01/04/2017   Procedure: COLONOSCOPY;  Surgeon: Sherrilyn Rist, MD;  Location: [facility] END[facility]COPY;  Service: Gastroenterology;  Laterality: N/A;   KNEE SURGERY Right    REPLACEMENT TOTAL KNEE Right 04/26/2022   Dr Ophelia Charter   TOTAL KNEE ARTHROPLASTY Right 04/26/2022   Procedure: RIGHT TOTAL KNEE ARTHROPLASTY;  Surgeon: Eldred Manges, MD;  Location: [facility] OR;  Service: Orthopedics;  Laterality: Right;  RNFA REQUESTED    Social History   Socioeconomic History   Marital status: Single    Spouse name: Not on file   Number of children: Not on file   Years of education: Not on file   Highest education level: Not on file  Occupational History   Not on file  Tobacco Use    Smoking status: Former    Current packs/day: 0.00    Average packs/day: 1 pack/day for 30.0 years (30.0 ttl pk-yrs)    Types: Cigarettes    Start date: 99    Quit date: 2018    Years since quitting: 6.5   Smokeless tobacco: Never   Tobacco comments:    QUIT October 18 2016  Vaping Use   Vaping status: Never Used  Substance and Sexual Activity   Alcohol use: No    Alcohol/week: 0.0 standard drinks of alcohol   Drug use: No   Sexual activity: Not Currently  Other Topics Concern   Not on file  Social History Narrative   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    In his early 48s he was a Candidate but never "Patched" for [employer] --> that is when he was in involved in PSA   Social Determinants of Health   Financial Resource Strain: Not on file  Food Insecurity: No Food Insecurity (05/07/2021)   [facility]   Hunger Vital Sign    Worried About Running Out of Food in the Last Year: Never true  Ran Out of Food in the Last Year: Never true  Transportation Needs: No Transportation Needs (11/16/2021)   [facility] from [facility]   [facility] - Transportation    Lack of Transportation (Medical): No    Lack of Transportation (Non-Medical): No  Physical Activity: Not on file  Stress: Not on file  Social Connections: Not on file  Intimate Partner Violence: Not on file    Family History  Problem Relation Age of Onset   Diabetes Mother    Arthritis Mother    AAA (abdominal aortic aneurysm) Mother    Intellectual disability Father    Arthritis Father    AAA (abdominal aortic aneurysm) Father    Coronary artery disease Maternal Grandmother        Died during CABG   Colon cancer Neg Hx    Esophageal cancer Neg Hx    Pancreatic cancer Neg Hx    Rectal cancer Neg Hx    Stomach cancer Neg Hx     Current Outpatient Medications on File  Prior to Visit  Medication Sig Dispense Refill   glucose blood (ONETOUCH VERIO) test strip USE TO TEST BLOOD GLUCOSE ONCE DAILY 100 each 12   Lancets (ONETOUCH ULTRASOFT) lancets USE TO TEST BLOOD GLUCOSE ONCE DAILY 100 each 12   lisinopril (ZESTRIL) 2.5 MG tablet Take 1 tablet (2.5 mg total) by mouth daily. 90 tablet 1   meloxicam (MOBIC) 15 MG tablet Take 1 tablet (15 mg total) by mouth daily. 90 tablet 1   Semaglutide, 2 MG/DOSE, 8 MG/3ML SOPN Inject 2 mg as directed once a week. 3 mL 5   tiZANidine (ZANAFLEX) 4 MG tablet Take 1 tablet (4 mg total) by mouth every 6 (six) hours as needed for muscle spasms. 40 tablet 5   No current facility-administered medications on file prior to visit.    No Known Allergies     Physical Exam Vitals requested from patient and listed below if patient had equipment and was able to obtain at home for this virtual visit:unable to obtain  There were no vitals filed for this visit. Estimated body mass index is 45.92 kg/m as calculated from the following:   Height as of 07/27/22: 5\' 8"  (1.727 m).   Weight as of 07/27/22: 302 lb (137 kg).  EKG (optional): deferred due to virtual visit  GENERAL: alert, oriented, no acute distress detected; full vision exam deferred due to pandemic and/or virtual encounter   HEENT: atraumatic, conjunttiva clear, no obvious abnormalities on inspection of external nose and ears  NECK: normal movements of the head and neck  LUNGS: on inspection no signs of respiratory distress, breathing rate appears normal, no obvious gross SOB, gasping or wheezing  CV: no obvious cyanosis  MS: moves all visible extremities without noticeable abnormality  PSYCH/NEURO: pleasant and cooperative, no obvious depression or anxiety, speech and thought processing grossly intact, Cognitive function grossly intact        10/28/2022    1:04 PM 06/22/2022    4:35 PM 11/20/2014    2:51 PM 11/13/2014    6:09 PM  Depression screen PHQ 2/9   Decreased Interest 0 0 0 0  Down, Depressed, Hopeless 0 0 0 0  PHQ - 2 Score 0 0 0 0       10/28/2022    1:04 PM  Fall Risk  Falls in the past year? 0  Was there an injury with Fall? 0  Fall Risk Category Calculator 0  SUMMARY AND PLAN:  Encounter for Medicare annual wellness exam    Discussed applicable health maintenance/preventive health measures and advised and referred or ordered per patient preferences: -he agrees to schedule his eye exam -for labs due and foot exam he plans to do the next time he is in the office to see Dr. Russella Dar - sent message to schedulers to assist in schedule appt in 3 months as did not have follow up on the books -discussed lung cancer screening and he declines at this time, agrees to consider and call if/when decides to do Health Maintenance  Topic Date Due   OPHTHALMOLOGY EXAM  Never done   HIV Screening  Never done   Diabetic kidney evaluation - Urine ACR  Never done   Hepatitis C Screening  Never done   Lung Cancer Screening  Never done   FOOT EXAM  05/31/2018   HEMOGLOBIN A1C  10/19/2022   COVID-19 Vaccine (1 - 2023-24 season) 06/22/2023 (Originally 12/11/2021)   INFLUENZA VACCINE  11/11/2022   Diabetic kidney evaluation - eGFR measurement  04/29/2023   Medicare Annual Wellness (AWV)  10/28/2023   Colonoscopy  01/05/2027   DTaP/Tdap/Td (4 - Td or Tdap) 12/16/2031   Zoster Vaccines- Shingrix  Completed   HPV VACCINES  Aged [employer] and counseling on the following was provided based on the above review of health and a plan/checklist for the patient, along with additional information discussed, was provided for the patient in the patient instructions :  -Advised on importance of completing advanced directives, discussed options for completing and provided information in patient instructions as well - reports his sister is currently setting this up -declined audiology evaluation at this time -Advised and counseled on a  healthy lifestyle  -Reviewed patient's current diet. Advised and counseled on a whole foods based healthy diet. A summary of a healthy diet was provided in the Patient Instructions.  -reviewed patient's current physical activity level and discussed exercise guidelines for adults. Discussed community resources and ideas for safe exercise at home to assist in meeting exercise guideline recommendations in a safe and healthy way.  -Advise yearly dental visits at minimum and regular eye exams   Follow up: see patient instructions   Patient Instructions  I really enjoyed getting to talk with you today! I am available on Tuesdays and Thursdays for virtual visits if you have any questions or concerns, or if I can be of any further assistance.   CHECKLIST FROM ANNUAL WELLNESS VISIT:  -Follow up (please call to schedule if not scheduled after visit):   -please schedule in office follow up with Dr. Russella Dar in the next 3 months   -yearly for annual wellness visit with primary care office  Here is a list of your preventive care/health maintenance measures and the plan for each if any are due:  PLAN For any measures below that may be due:  -please check with your insurance and schedule your diabetic eye exam with an eye doctor ASAP -please consider doing the lung cancer screening and call Dr. Ardell Isaacs office if you decide to do this so that we can order it -please let us know if you wish to do the foot exam, diabetes lab, prostate screening, HIV screening, hepatitis C screening   Health Maintenance  Topic Date Due   OPHTHALMOLOGY EXAM  Never done   HIV Screening  Never done   Diabetic kidney evaluation - Urine ACR  Never done   Hepatitis C Screening  Never done   Lung Cancer Screening  Never done   FOOT EXAM  05/31/2018   HEMOGLOBIN A1C  10/19/2022   COVID-19 Vaccine (1 - 2023-24 season) 06/22/2023 (Originally 12/11/2021)   INFLUENZA VACCINE  11/11/2022   Diabetic kidney evaluation - eGFR  measurement  04/29/2023   Medicare Annual Wellness (AWV)  10/28/2023   Colonoscopy  01/05/2027   DTaP/Tdap/Td (4 - Td or Tdap) 12/16/2031   Zoster Vaccines- Shingrix  Completed   HPV VACCINES  Aged Out    -See a dentist at least yearly  -Get your eyes checked and then per your eye specialist's recommendations  -Other issues addressed today:   -I have included below further information regarding a healthy whole foods based diet, physical activity guidelines for adults, stress management and opportunities for social connections. I hope you find this information useful.   -----------------------------------------------------------------------------------------------------------------------------------------------------------------------------------------------------------------------------------------------------------  NUTRITION: -eat real food: lots of colorful vegetables (half the plate) and fruits -5-7 servings of vegetables and fruits per day (fresh or steamed is best), exp. 2 servings of vegetables with lunch and dinner and 2 servings of fruit per day. Berries and greens such as kale and collards are great choices.  -consume on a regular basis: whole grains (make sure first ingredient on label contains the word "whole"), fresh fruits, fish, nuts, seeds, healthy oils (such as olive oil, avocado oil, grape seed oil) -may eat small amounts of dairy and lean meat on occasion, but avoid processed meats such as ham, bacon, lunch meat, etc. -drink water -try to avoid fast food and pre-packaged foods, processed meat -most experts advise limiting sodium to < 2300mg  per day, should limit further is any chronic conditions such as high blood pressure, heart disease, diabetes, etc. The American Heart Association advised that < 1500mg  is is ideal -try to avoid foods that contain any ingredients with names you do not recognize  -try to avoid sugar/sweets (except for the natural sugar that occurs in  fresh fruit) -try to avoid sweet drinks -try to avoid white rice, white bread, pasta (unless whole grain), white or yellow potatoes  EXERCISE GUIDELINES FOR ADULTS: -if you wish to increase your physical activity, do so gradually and with the approval of your doctor -STOP and seek medical care immediately if you have any chest pain, chest discomfort or trouble breathing when starting or increasing exercise  -move and stretch your body, legs, feet and arms when sitting for long periods -Physical activity guidelines for optimal health in adults: -least 150 minutes per week of aerobic exercise (can talk, but not sing) once approved by your doctor, 20-30 minutes of sustained activity or two 10 minute episodes of sustained activity every day.  -resistance training at least 2 days per week if approved by your doctor -balance exercises 3+ days per week:   Stand somewhere where you have something sturdy to hold onto if you lose balance.    1) lift up on toes, start with 5x per day and work up to 20x   2) stand and lift on leg straight out to the side so that foot is a few inches of the floor, start with 5x each side and work up to 20x each side   3) stand on one foot, start with 5 seconds each side and work up to 20 seconds on each side  If you need ideas or help with getting more active:  -Silver sneakers https://tools.silversneakers.com  -Walk with a Doc: http://www.duncan-williams.com/  -try to include resistance (weight lifting/strength building) and  balance exercises twice per week: or the following link for ideas: http://castillo-powell.com/  BuyDucts.dk  STRESS MANAGEMENT: -can try meditating, or just sitting quietly with deep breathing while intentionally relaxing all parts of your body for 5 minutes daily -if you need further help with stress, anxiety or depression please follow up with your primary  doctor or contact the wonderful folks at [facility]: [phone]  SOCIAL CONNECTIONS: -options in [place] if you wish to engage in more social and exercise related activities:  -Silver sneakers https://tools.silversneakers.com  -Walk with a Doc: http://www.duncan-williams.com/  -Check out the [facility] Active Adults 50+ section on the [place] [employer] (hiking clubs, book clubs, cards and games, chess, exercise classes, aquatic classes and much more) - see the website for details: https://www.[place].gov/departments/parks-recreation/active-adults50  -YouTube has lots of exercise videos for different ages and abilities as well  -[facility] (a variety of indoor and outdoor inperson activities for adults). [phone]. [address]  -Virtual Online Classes (a variety of topics): see seniorplanet.org or call [phone]  -consider volunteering at a school, hospice center, church, senior center or elsewhere           Terressa Koyanagi, DO

## 2023-01-11 ENCOUNTER — Telehealth: Payer: Self-pay | Admitting: *Deleted

## 2023-01-11 DIAGNOSIS — I10 Essential (primary) hypertension: Secondary | ICD-10-CM

## 2023-01-11 DIAGNOSIS — M545 Low back pain, unspecified: Secondary | ICD-10-CM

## 2023-01-11 MED ORDER — MELOXICAM 15 MG PO TABS: 15.0000 mg | ORAL_TABLET | Freq: Every day | ORAL | 0 refills | Status: DC

## 2023-01-11 MED ORDER — LISINOPRIL 2.5 MG PO TABS: 2.5000 mg | ORAL_TABLET | Freq: Every day | ORAL | 1 refills | Status: DC

## 2023-01-11 NOTE — Telephone Encounter (Signed)
Rx done. 

## 2023-01-12 ENCOUNTER — Telehealth: Payer: Self-pay | Admitting: Cardiology

## 2023-01-12 NOTE — Telephone Encounter (Signed)
Patient states this is for a test "to see if aneurism in my stomach". States he received call yesterday from our office. Do not see any call in the chart

## 2023-01-12 NOTE — Telephone Encounter (Signed)
New Message:     Patient call back and wanted you to know that these dates are good for his procedure. 02-02-23, 02-03-23, and 02-04-23 are good dates.Marland Kitchen

## 2023-01-14 NOTE — Telephone Encounter (Signed)
Will send  message PV scheduler to contact patient to schedule AAA ultrasound  Awaiting to see in updated order is needed

## 2023-01-28 ENCOUNTER — Encounter: Payer: Self-pay | Admitting: Family Medicine

## 2023-01-28 ENCOUNTER — Ambulatory Visit (INDEPENDENT_AMBULATORY_CARE_PROVIDER_SITE_OTHER): Payer: BLUE CROSS/BLUE SHIELD | Admitting: Family Medicine

## 2023-01-28 VITALS — BP 128/80 | HR 65 | Temp 97.9°F | Ht 67.75 in | Wt 286.5 lb

## 2023-01-28 DIAGNOSIS — Z125 Encounter for screening for malignant neoplasm of prostate: Secondary | ICD-10-CM | POA: Diagnosis not present

## 2023-01-28 DIAGNOSIS — Z114 Encounter for screening for human immunodeficiency virus [HIV]: Secondary | ICD-10-CM

## 2023-01-28 DIAGNOSIS — E119 Type 2 diabetes mellitus without complications: Secondary | ICD-10-CM | POA: Diagnosis not present

## 2023-01-28 DIAGNOSIS — Z23 Encounter for immunization: Secondary | ICD-10-CM

## 2023-01-28 DIAGNOSIS — Z7985 Long-term (current) use of injectable non-insulin antidiabetic drugs: Secondary | ICD-10-CM

## 2023-01-28 DIAGNOSIS — Z0000 Encounter for general adult medical examination without abnormal findings: Secondary | ICD-10-CM | POA: Diagnosis not present

## 2023-01-28 DIAGNOSIS — B372 Candidiasis of skin and nail: Secondary | ICD-10-CM | POA: Diagnosis not present

## 2023-01-28 DIAGNOSIS — Z1159 Encounter for screening for other viral diseases: Secondary | ICD-10-CM

## 2023-01-28 LAB — MICROALBUMIN / CREATININE URINE RATIO
Creatinine,U: 141.9 mg/dL
Microalb Creat Ratio: 0.5 mg/g (ref 0.0–30.0)
Microalb, Ur: <0.7 mg/dL (ref 0.0–1.9)

## 2023-01-28 LAB — COMPREHENSIVE METABOLIC PANEL
ALT: 16 U/L (ref 0–53)
AST: 19 U/L (ref 0–37)
Albumin: 4 g/dL (ref 3.5–5.2)
Alkaline Phosphatase: 82 U/L (ref 39–117)
BUN: 14 mg/dL (ref 6–23)
CO2: 27 meq/L (ref 19–32)
Calcium: 9.1 mg/dL (ref 8.4–10.5)
Chloride: 103 meq/L (ref 96–112)
Creatinine, Ser: 1.22 mg/dL (ref 0.40–1.50)
GFR: 62.35 mL/min (ref >60.00)
Glucose, Bld: 117 mg/dL — ABNORMAL HIGH (ref 70–99)
Potassium: 4.4 meq/L (ref 3.5–5.1)
Sodium: 137 meq/L (ref 135–145)
Total Bilirubin: 0.6 mg/dL (ref 0.2–1.2)
Total Protein: 6.6 g/dL (ref 6.0–8.3)

## 2023-01-28 LAB — POCT GLYCOSYLATED HEMOGLOBIN (HGB A1C): Hemoglobin A1C: 5.9 % — AB (ref 4.0–5.6)

## 2023-01-28 LAB — LIPID PANEL
Cholesterol: 152 mg/dL (ref 0–200)
HDL: 33.6 mg/dL — ABNORMAL LOW (ref >39.00)
LDL Cholesterol: 105 mg/dL — ABNORMAL HIGH (ref 0–99)
NonHDL: 118.01
Total CHOL/HDL Ratio: 5
Triglycerides: 66 mg/dL (ref 0.0–149.0)
VLDL: 13.2 mg/dL (ref 0.0–40.0)

## 2023-01-28 LAB — PSA: PSA: 0.26 ng/mL (ref 0.10–4.00)

## 2023-01-28 MED ORDER — CLOTRIMAZOLE-BETAMETHASONE 1-0.05 % EX CREA: 1.0000 | TOPICAL_CREAM | Freq: Two times a day (BID) | CUTANEOUS | 5 refills | Status: DC

## 2023-01-28 MED ORDER — SEMAGLUTIDE (2 MG/DOSE) 8 MG/3ML ~~LOC~~ SOPN: 2.0000 mg | PEN_INJECTOR | SUBCUTANEOUS | 3 refills | Status: DC

## 2023-01-28 NOTE — Patient Instructions (Addendum)
[employer] protein shakes  Health Maintenance, Male Adopting a healthy lifestyle and getting preventive care are important in promoting health and wellness. Ask your health care provider about: The right schedule for you to have regular tests and exams. Things you can do on your own to prevent diseases and keep yourself healthy. What should I know about diet, weight, and exercise? Eat a healthy diet  Eat a diet that includes plenty of vegetables, fruits, low-fat dairy products, and lean protein. Do not eat a lot of foods that are high in solid fats, added sugars, or sodium. Maintain a healthy weight Body mass index (BMI) is a measurement that can be used to identify possible weight problems. It estimates body fat based on height and weight. Your health care provider can help determine your BMI and help you achieve or maintain a healthy weight. Get regular exercise Get regular exercise. This is one of the most important things you can do for your health. Most adults should: Exercise for at least 150 minutes each week. The exercise should increase your heart rate and make you sweat (moderate-intensity exercise). Do strengthening exercises at least twice a week. This is in addition to the moderate-intensity exercise. Spend less time sitting. Even light physical activity can be beneficial. Watch cholesterol and blood lipids Have your blood tested for lipids and cholesterol at 65 years of age, then have this test every 5 years. You may need to have your cholesterol levels checked more often if: Your lipid or cholesterol levels are high. You are older than 65 years of age. You are at high risk for heart disease. What should I know about cancer screening? Many types of cancers can be detected early and may often be prevented. Depending on your health history and family history, you may need to have cancer screening at various ages. This may include screening for: Colorectal cancer. Prostate  cancer. Skin cancer. Lung cancer. What should I know about heart disease, diabetes, and high blood pressure? Blood pressure and heart disease High blood pressure causes heart disease and increases the risk of stroke. This is more likely to develop in people who have high blood pressure readings or are overweight. Talk with your health care provider about your target blood pressure readings. Have your blood pressure checked: Every 3-5 years if you are 19-39 years of age. Every year if you are 30 years old or older. If you are between the ages of 44 and 27 and are a current or former smoker, ask your health care provider if you should have a one-time screening for abdominal aortic aneurysm (AAA). Diabetes Have regular diabetes screenings. This checks your fasting blood sugar level. Have the screening done: Once every three years after age 72 if you are at a normal weight and have a low risk for diabetes. More often and at a younger age if you are overweight or have a high risk for diabetes. What should I know about preventing infection? Hepatitis B If you have a higher risk for hepatitis B, you should be screened for this virus. Talk with your health care provider to find out if you are at risk for hepatitis B infection. Hepatitis C Blood testing is recommended for: Everyone born from [birth year] through 1963. Anyone with known risk factors for hepatitis C. Sexually transmitted infections (STIs) You should be screened each year for STIs, including gonorrhea and chlamydia, if: You are sexually active and are younger than 65 years of age. You are older than 65  years of age and your health care provider tells you that you are at risk for this type of infection. Your sexual activity has changed since you were last screened, and you are at increased risk for chlamydia or gonorrhea. Ask your health care provider if you are at risk. Ask your health care provider about whether you are at high risk for HIV.  Your health care provider may recommend a prescription medicine to help prevent HIV infection. If you choose to take medicine to prevent HIV, you should first get tested for HIV. You should then be tested every 3 months for as long as you are taking the medicine. Follow these instructions at home: Alcohol use Do not drink alcohol if your health care provider tells you not to drink. If you drink alcohol: Limit how much you have to 0-2 drinks a day. Know how much alcohol is in your drink. In the [place] one drink equals one 12 oz bottle of beer (355 mL), one 5 oz glass of wine (148 mL), or one 1 oz glass of hard liquor (44 mL). Lifestyle Do not use any products that contain nicotine or tobacco. These products include cigarettes, chewing tobacco, and vaping devices, such as e-cigarettes. If you need help quitting, ask your health care provider. Do not use street drugs. Do not share needles. Ask your health care provider for help if you need support or information about quitting drugs. General instructions Schedule regular health, dental, and eye exams. Stay current with your vaccines. Tell your health care provider if: You often feel depressed. You have ever been abused or do not feel safe at home. Summary Adopting a healthy lifestyle and getting preventive care are important in promoting health and wellness. Follow your health care provider's instructions about healthy diet, exercising, and getting tested or screened for diseases. Follow your health care provider's instructions on monitoring your cholesterol and blood pressure. This information is not intended to replace advice given to you by your health care provider. Make sure you discuss any questions you have with your health care provider. Document Revised: 08/18/2020 Document Reviewed: 08/18/2020 Elsevier Patient Education  2024 [employer].

## 2023-01-28 NOTE — Progress Notes (Signed)
Complete physical exam  Patient: Jerome Sims   DOB: [birth year]   67 y.o. Male  MRN: [id number]  Subjective:    Chief Complaint  Patient presents with   Annual Exam    Jerome Sims is a 67 y.o. male who presents today for a complete physical exam. He reports consuming a general diet. The patient does not participate in regular exercise at present. He generally feels well. He reports sleeping fairly well. He does not have additional problems to discuss today.    Most recent fall risk assessment:    10/28/2022    1:04 PM  Fall Risk   Falls in the past year? 0  Number falls in past yr: 0  Injury with Fall? 0     Most recent depression screenings:    01/28/2023    8:52 AM 10/28/2022    1:04 PM  PHQ 2/9 Scores  PHQ - 2 Score 0 0  PHQ- 9 Score 1     Vision:Within last year and Dental: No current dental problems-- has dentures  Patient Active Problem List   Diagnosis Date Noted   Dyspnea on exertion 08/01/2022   Metabolic syndrome 08/01/2022   Family history of abdominal aortic aneurysm (AAA) 07/26/2022   Family history of coronary artery disease in maternal grandmother 07/26/2022   Chronic bilateral low back pain without sciatica 06/23/2022   S/P total knee arthroplasty 04/26/2022   Essential hypertension 02/21/2022   Hyperlipidemia associated with type 2 diabetes mellitus (HCC) 02/21/2022   Preoperative cardiovascular examination 02/21/2022   S/P cervical spinal fusion 07/15/2017   Type 2 diabetes mellitus without complication, without long-term current use of insulin (HCC) 05/31/2017   Left hip pain 03/24/2017   Carpal tunnel syndrome of left wrist 12/15/2016   Kidney stones 09/23/2016   Heavy smoker (more than 20 cigarettes per day) 09/21/2016   Sun-damaged skin 09/21/2016   Morbid obesity with BMI of 45.0-49.9, adult (HCC) 09/20/2016   OSA on CPAP 09/20/2016   Other emphysema (HCC) 09/20/2016      Patient Care Team: Karie Georges, MD as PCP -  General (Family Medicine) Marykay Lex, MD as PCP - Cardiology (Cardiology) Myrtie Neither Andreas Blower, MD as Consulting Physician (Gastroenterology) Eldred Manges, MD as Consulting Physician (Orthopedic Surgery)   Outpatient Medications Prior to Visit  Medication Sig   glucose blood (ONETOUCH VERIO) test strip USE TO TEST BLOOD GLUCOSE ONCE DAILY   Lancets (ONETOUCH ULTRASOFT) lancets USE TO TEST BLOOD GLUCOSE ONCE DAILY   lisinopril (ZESTRIL) 2.5 MG tablet Take 1 tablet (2.5 mg total) by mouth daily.   meloxicam (MOBIC) 15 MG tablet Take 1 tablet (15 mg total) by mouth daily.   tiZANidine (ZANAFLEX) 4 MG tablet Take 1 tablet (4 mg total) by mouth every 6 (six) hours as needed for muscle spasms.   [DISCONTINUED] Semaglutide, 2 MG/DOSE, 8 MG/3ML SOPN Inject 2 mg as directed once a week.   No facility-administered medications prior to visit.    Review of Systems  HENT:  Negative for hearing loss.   Eyes:  Negative for blurred vision.  Respiratory:  Negative for shortness of breath.   Cardiovascular:  Negative for chest pain.  Gastrointestinal: Negative.   Genitourinary: Negative.   Musculoskeletal:  Negative for back pain.  Neurological:  Negative for headaches.  Psychiatric/Behavioral:  Negative for depression.   All other systems reviewed and are negative.      Objective:     BP 128/80 (BP Location: Left  Arm, Patient Position: Sitting, Cuff Size: Large)   Pulse 65   Temp 97.9 F (36.6 C) (Oral)   Ht 5' 7.75" (1.721 m)   Wt 286 lb 8 oz (130 kg)   SpO2 97%   BMI 43.88 kg/m    Physical Exam Vitals reviewed.  Constitutional:      Appearance: Normal appearance. He is well-groomed. He is obese.  HENT:     Right Ear: Tympanic membrane and ear canal normal.     Left Ear: Tympanic membrane and ear canal normal.     Mouth/Throat:     Mouth: Mucous membranes are moist.     Pharynx: No posterior oropharyngeal erythema.  Eyes:     Extraocular Movements: Extraocular movements  intact.     Conjunctiva/sclera: Conjunctivae normal.  Neck:     Thyroid: No thyromegaly.  Cardiovascular:     Rate and Rhythm: Normal rate and regular rhythm.     Heart sounds: S1 normal and S2 normal. No murmur heard. Pulmonary:     Effort: Pulmonary effort is normal.     Breath sounds: Normal breath sounds and air entry. No rales.  Abdominal:     General: Bowel sounds are normal.     Palpations: Abdomen is soft.  Musculoskeletal:     Right lower leg: No edema.     Left lower leg: No edema.  Lymphadenopathy:     Cervical: No cervical adenopathy.  Neurological:     General: No focal deficit present.     Mental Status: He is alert and oriented to person, place, and time.     Gait: Gait is intact.  Psychiatric:        Mood and Affect: Mood and affect normal.     Diabetic Foot Exam - Simple   Simple Foot Form Diabetic Foot exam was performed with the following findings: Yes 01/28/2023  8:34 AM  Visual Inspection No deformities, no ulcerations, no other skin breakdown bilaterally: Yes Sensation Testing Intact to touch and monofilament testing bilaterally: Yes Pulse Check Posterior Tibialis and Dorsalis pulse intact bilaterally: Yes Comments      Results for orders placed or performed in visit on 01/28/23  POC HgB A1c  Result Value Ref Range   Hemoglobin A1C 5.9 (A) 4.0 - 5.6 %   HbA1c POC (<> result, manual entry)     HbA1c, POC (prediabetic range)     HbA1c, POC (controlled diabetic range)         Assessment & Plan:    Routine Health Maintenance and Physical Exam  Immunization History  Administered Date(s) Administered   Fluad Trivalent(High Dose 65+) 01/28/2023   Influenza Inj Mdck Quad Pf 01/22/2020   Influenza,inj,Quad PF,6+ Mos 01/03/2017, 01/12/2019, 01/07/2021   Influenza-Unspecified 12/15/2021   PNEUMOCOCCAL CONJUGATE-20 01/21/2021   Pneumococcal Polysaccharide-23 01/22/2020   Respiratory Syncytial Virus Vaccine,Recomb Aduvanted(Arexvy) 12/15/2021    Tdap 11/13/2014, 03/06/2019, 12/15/2021   Zoster Recombinant(Shingrix) 01/22/2020, 06/23/2020    Health Maintenance  Topic Date Due   OPHTHALMOLOGY EXAM  Never done   HIV Screening  Never done   Diabetic kidney evaluation - Urine ACR  Never done   Hepatitis C Screening  Never done   Lung Cancer Screening  Never done   COVID-19 Vaccine (1 - 2023-24 season) Never done   Diabetic kidney evaluation - eGFR measurement  04/29/2023   HEMOGLOBIN A1C  07/29/2023   Medicare Annual Wellness (AWV)  10/28/2023   FOOT EXAM  01/28/2024   Colonoscopy  01/05/2027   DTaP/Tdap/Td (  4 - Td or Tdap) 12/16/2031   Pneumonia Vaccine 6+ Years old  Completed   INFLUENZA VACCINE  Completed   Zoster Vaccines- Shingrix  Completed   HPV VACCINES  Aged Out    Discussed health benefits of physical activity, and encouraged him to engage in regular exercise appropriate for his age and condition.  Routine general medical examination at a health care facility Normal physical exam findings except for morbid obesity, however he has lost 26 pounds since the last weight, pt reports he is doing very well on Ozempic. Counseled patient on the low carb diet -- emphasis on low sugar and low starch. Handouts given on healthy eating and exercise. Checking annual labs today as well.       -     Comprehensive metabolic panel  Need for immunization against influenza -     Flu Vaccine Trivalent High Dose (Fluad)  Type 2 diabetes mellitus without complication, without long-term current use of insulin (HCC) -     POCT glycosylated hemoglobin (Hb A1C) -     Lipid panel; Future -     Semaglutide (2 MG/DOSE); Inject 2 mg as directed once a week.  Dispense: 9 mL; Refill: 3 -     Microalbumin / creatinine urine ratio; Future  Candidal intertrigo -     Clotrimazole-Betamethasone; Apply 1 Application topically 2 (two) times daily.  Dispense: 45 g; Refill: 5  Prostate cancer screening -     PSA  Encounter for screening for HIV -      HIV Antibody (routine testing w rflx)  Need for hepatitis C screening test -     Hepatitis C antibody    Return in 6 months (on 07/29/2023).     Karie Georges, MD

## 2023-01-29 LAB — HEPATITIS C ANTIBODY: Hepatitis C Ab: NONREACTIVE

## 2023-01-29 LAB — HIV ANTIBODY (ROUTINE TESTING W REFLEX): HIV 1&2 Ab, 4th Generation: NONREACTIVE

## 2023-02-16 ENCOUNTER — Other Ambulatory Visit (HOSPITAL_COMMUNITY): Payer: Self-pay

## 2023-04-07 ENCOUNTER — Other Ambulatory Visit: Payer: Self-pay | Admitting: Family Medicine

## 2023-04-07 DIAGNOSIS — M545 Low back pain, unspecified: Secondary | ICD-10-CM

## 2023-05-30 ENCOUNTER — Other Ambulatory Visit (HOSPITAL_COMMUNITY): Payer: Self-pay

## 2023-06-01 ENCOUNTER — Other Ambulatory Visit: Payer: Self-pay | Admitting: Medical Genetics

## 2023-06-01 ENCOUNTER — Encounter: Payer: Self-pay | Admitting: Cardiology

## 2023-06-02 DIAGNOSIS — H52223 Regular astigmatism, bilateral: Secondary | ICD-10-CM | POA: Diagnosis not present

## 2023-06-13 ENCOUNTER — Other Ambulatory Visit (HOSPITAL_COMMUNITY): Payer: Self-pay

## 2023-06-14 ENCOUNTER — Other Ambulatory Visit: Payer: BLUE CROSS/BLUE SHIELD

## 2023-06-14 DIAGNOSIS — Z008 Encounter for other general examination: Secondary | ICD-10-CM | POA: Diagnosis not present

## 2023-06-14 LAB — HM DIABETES EYE EXAM

## 2023-06-23 ENCOUNTER — Other Ambulatory Visit (HOSPITAL_COMMUNITY): Payer: Self-pay

## 2023-06-23 ENCOUNTER — Telehealth: Payer: Self-pay | Admitting: *Deleted

## 2023-06-23 NOTE — Telephone Encounter (Signed)
 Copied from CRM [id number]. Topic: General - Other >> Jun 23, 2023  2:53 PM [place] A wrote: Reason for CRM: Luiz Ochoa) with [facility] called to give results for a DEE test for patient; Test results were abnormal  [phone]

## 2023-06-24 NOTE — Telephone Encounter (Signed)
 Noted, pt is supposed to have a follow up with me in April, please have him schedule and we will recheck the urine testing at that time.

## 2023-06-24 NOTE — Telephone Encounter (Signed)
 Unable to leave a message due to voicemail being full.

## 2023-06-24 NOTE — Telephone Encounter (Signed)
 I called the patient to verify he had a test below as I do not see a previous order from Dr Casimiro Needle.  Patient stated a nurse came to his home on Monday and performed an eye exam.  I called the number below and per Yvonna Alanis this number is the healthcare benefits line for members, not results.  I called the patient back and asked if he had paperwork with a number listed and was given the number [phone].  Spoke with Weldon Inches at this number and she stated the report will be faxed to our office.  Patient also stated he received the letter regarding the problem/error with the microalbumin testing and is aware the message was forwarded to PCP for review.

## 2023-06-27 NOTE — Telephone Encounter (Signed)
Patient informed of the message below, stated he will check his schedule and call back for an appt.

## 2023-07-14 ENCOUNTER — Other Ambulatory Visit: Payer: Self-pay | Admitting: Cardiology

## 2023-07-14 ENCOUNTER — Encounter: Payer: Self-pay | Admitting: Cardiology

## 2023-07-14 ENCOUNTER — Ambulatory Visit (HOSPITAL_COMMUNITY)
Admission: RE | Admit: 2023-07-14 | Discharge: 2023-07-14 | Disposition: A | Discharge status: Home / Self Care | Source: Ambulatory Visit | Attending: Cardiology | Admitting: Cardiology

## 2023-07-14 DIAGNOSIS — Z136 Encounter for screening for cardiovascular disorders: Secondary | ICD-10-CM | POA: Diagnosis present

## 2023-07-14 DIAGNOSIS — G4733 Obstructive sleep apnea (adult) (pediatric): Secondary | ICD-10-CM

## 2023-07-14 DIAGNOSIS — E119 Type 2 diabetes mellitus without complications: Secondary | ICD-10-CM | POA: Insufficient documentation

## 2023-07-14 DIAGNOSIS — Z8249 Family history of ischemic heart disease and other diseases of the circulatory system: Secondary | ICD-10-CM

## 2023-07-14 DIAGNOSIS — E785 Hyperlipidemia, unspecified: Secondary | ICD-10-CM | POA: Insufficient documentation

## 2023-07-14 DIAGNOSIS — Z6841 Body Mass Index (BMI) 40.0 and over, adult: Secondary | ICD-10-CM | POA: Diagnosis not present

## 2023-07-14 DIAGNOSIS — E1169 Type 2 diabetes mellitus with other specified complication: Secondary | ICD-10-CM

## 2023-07-14 DIAGNOSIS — I10 Essential (primary) hypertension: Secondary | ICD-10-CM | POA: Insufficient documentation

## 2023-07-14 DIAGNOSIS — Z79899 Other long term (current) drug therapy: Secondary | ICD-10-CM | POA: Diagnosis not present

## 2023-07-14 DIAGNOSIS — Z87891 Personal history of nicotine dependence: Secondary | ICD-10-CM | POA: Diagnosis not present

## 2023-07-14 DIAGNOSIS — R0609 Other forms of dyspnea: Secondary | ICD-10-CM

## 2023-07-14 DIAGNOSIS — E8881 Metabolic syndrome: Secondary | ICD-10-CM

## 2023-07-14 DIAGNOSIS — F17210 Nicotine dependence, cigarettes, uncomplicated: Secondary | ICD-10-CM

## 2023-07-14 DIAGNOSIS — J438 Other emphysema: Secondary | ICD-10-CM

## 2023-07-28 ENCOUNTER — Other Ambulatory Visit: Payer: Self-pay | Admitting: Family Medicine

## 2023-07-28 DIAGNOSIS — M545 Low back pain, unspecified: Secondary | ICD-10-CM

## 2023-07-28 DIAGNOSIS — I10 Essential (primary) hypertension: Secondary | ICD-10-CM

## 2023-08-01 ENCOUNTER — Encounter: Payer: Self-pay | Admitting: Family Medicine

## 2023-09-26 ENCOUNTER — Telehealth: Payer: Self-pay

## 2023-09-26 NOTE — Progress Notes (Signed)
   09/26/2023  Patient ID: Jerome Sims, male   DOB: 10/28/1955, 68 y.o.   MRN: [id number]  Pharmacy Quality Measure Review  This patient is appearing on a report for being at risk of failing the adherence measure for diabetes medications this calendar year.   Medication: Ozempic  2mg  Last fill date: 06/25/23 for 28 day supply  Contacted pharmacy to facilitate refills. Patient plans to pick up on 09/1923  Carnell Christian, PharmD Clinical Pharmacist [phone]

## 2023-10-12 ENCOUNTER — Encounter: Payer: Self-pay | Admitting: Family Medicine

## 2023-10-12 ENCOUNTER — Ambulatory Visit (INDEPENDENT_AMBULATORY_CARE_PROVIDER_SITE_OTHER): Admitting: Family Medicine

## 2023-10-12 VITALS — BP 110/80 | HR 70 | Temp 98.3°F | Ht 67.75 in | Wt 284.2 lb

## 2023-10-12 DIAGNOSIS — I10 Essential (primary) hypertension: Secondary | ICD-10-CM

## 2023-10-12 DIAGNOSIS — Z87891 Personal history of nicotine dependence: Secondary | ICD-10-CM | POA: Diagnosis not present

## 2023-10-12 DIAGNOSIS — J209 Acute bronchitis, unspecified: Secondary | ICD-10-CM

## 2023-10-12 DIAGNOSIS — Z7985 Long-term (current) use of injectable non-insulin antidiabetic drugs: Secondary | ICD-10-CM | POA: Diagnosis not present

## 2023-10-12 DIAGNOSIS — E119 Type 2 diabetes mellitus without complications: Secondary | ICD-10-CM

## 2023-10-12 LAB — POCT GLYCOSYLATED HEMOGLOBIN (HGB A1C): Hemoglobin A1C: 6.2 % — AB (ref 4.0–5.6)

## 2023-10-12 LAB — MICROALBUMIN / CREATININE URINE RATIO
Creatinine,U: 192.9 mg/dL
Microalb Creat Ratio: 3.7 mg/g (ref 0.0–30.0)
Microalb, Ur: 0.7 mg/dL (ref 0.0–1.9)

## 2023-10-12 MED ORDER — ALBUTEROL SULFATE HFA 108 (90 BASE) MCG/ACT IN AERS: 2.0000 | INHALATION_SPRAY | Freq: Four times a day (QID) | RESPIRATORY_TRACT | 2 refills | Status: AC | PRN

## 2023-10-12 MED ORDER — LISINOPRIL 2.5 MG PO TABS: 2.5000 mg | ORAL_TABLET | Freq: Every day | ORAL | 1 refills | Status: AC

## 2023-10-12 MED ORDER — MOUNJARO 15 MG/0.5ML ~~LOC~~ SOAJ: 15.0000 mg | SUBCUTANEOUS | 3 refills | Status: AC

## 2023-10-12 MED ORDER — DOXYCYCLINE HYCLATE 100 MG PO TABS: 100.0000 mg | ORAL_TABLET | Freq: Two times a day (BID) | ORAL | 0 refills | Status: AC

## 2023-10-12 MED ORDER — METHYLPREDNISOLONE 4 MG PO TBPK: ORAL_TABLET | ORAL | 0 refills | Status: DC

## 2023-10-12 NOTE — Progress Notes (Signed)
 Established Patient Office Visit  Subjective   Patient ID: Jerome Sims, male    DOB: 12-24-1955  Age: 68 y.o. MRN: [id number]  Chief Complaint  Patient presents with   Medical Management of Chronic Issues   Cough    Productive with green-yellow sputum x5 days, tried Mucinex which he felt increased chest congestion instead of providing relief   Headache    X6 days    Pt is here for follow up today for his DM and HTN.  DM-- pt is reporting that the Ozempic  is causing runny stools, has had diarrhea every week, sometimes cannot make it to the bathroom. States that he also gets sick to his stomach with the ozempic  as well. A1C today is 6.2, slightly higher than previous. He reports he sometimes doesn't take his shot due to the side effects.  HTN -- BP in office performed and is well controlled. He  reports no side effects to the medications, no chest pain, SOB, dizziness or headaches. He has a BP cuff at home and is checking BP regularly, reports they are in the normal range.      Cough This is a new problem. The current episode started in the past 7 days. The problem has been unchanged. The problem occurs constantly. The cough is Non-productive. He has tried OTC cough suppressant for the symptoms. The treatment provided no relief. His past medical history is significant for emphysema.    Current Outpatient Medications  Medication Instructions   albuterol  (VENTOLIN  HFA) 108 (90 Base) MCG/ACT inhaler 2 puffs, Inhalation, Every 6 hours PRN   clotrimazole -betamethasone  (LOTRISONE ) cream 1 Application, Topical, 2 times daily   doxycycline  (VIBRA -TABS) 100 mg, Oral, 2 times daily   glucose blood (ONETOUCH VERIO) test strip USE TO TEST BLOOD GLUCOSE ONCE DAILY   Lancets (ONETOUCH ULTRASOFT) lancets USE TO TEST BLOOD GLUCOSE ONCE DAILY   lisinopril  (ZESTRIL ) 2.5 mg, Oral, Daily   meloxicam  (MOBIC ) 15 mg, Oral, Daily   methylPREDNISolone  (MEDROL  DOSEPAK) 4 MG TBPK tablet Take package as  directed   Mounjaro  15 mg, Subcutaneous, Weekly   tiZANidine  (ZANAFLEX ) 4 mg, Oral, Every 6 hours PRN    Patient Active Problem List   Diagnosis Date Noted   Dyspnea on exertion 08/01/2022   Metabolic syndrome 08/01/2022   Family history of abdominal aortic aneurysm (AAA) 07/26/2022   Family history of coronary artery disease in maternal grandmother 07/26/2022   Chronic bilateral low back pain without sciatica 06/23/2022   S/P total knee arthroplasty 04/26/2022   Essential hypertension 02/21/2022   Hyperlipidemia associated with type 2 diabetes mellitus (HCC) 02/21/2022   Preoperative cardiovascular examination 02/21/2022   S/P cervical spinal fusion 07/15/2017   Type 2 diabetes mellitus without complication, without long-term current use of insulin  (HCC) 05/31/2017   Left hip pain 03/24/2017   Carpal tunnel syndrome of left wrist 12/15/2016   Kidney stones 09/23/2016   Heavy smoker (more than 20 cigarettes per day) 09/21/2016   Sun-damaged skin 09/21/2016   Morbid obesity with BMI of 45.0-49.9, adult (HCC) 09/20/2016   OSA on CPAP 09/20/2016   Other emphysema (HCC) 09/20/2016      Review of Systems  Respiratory:  Positive for cough.   All other systems reviewed and are negative.     Objective:     BP 110/80   Pulse 70   Temp 98.3 F (36.8 C) (Oral)   Ht 5' 7.75 (1.721 m)   Wt 284 lb 3.2 oz (128.9 kg)  SpO2 98%   BMI 43.53 kg/m    Physical Exam Vitals reviewed.  Constitutional:      Appearance: Normal appearance. He is well-groomed. He is morbidly obese.  Eyes:     Extraocular Movements: Extraocular movements intact.     Conjunctiva/sclera: Conjunctivae normal.  Neck:     Thyroid: No thyromegaly.  Cardiovascular:     Rate and Rhythm: Regular rhythm.     Heart sounds: S1 normal and S2 normal. No murmur heard. Pulmonary:     Effort: Pulmonary effort is normal.     Breath sounds: Normal air entry. Wheezing present. No rales.  Abdominal:     General:  Abdomen is flat. Bowel sounds are normal.  Musculoskeletal:     Right lower leg: No edema.     Left lower leg: No edema.  Neurological:     General: No focal deficit present.     Mental Status: He is alert and oriented to person, place, and time.     Gait: Gait is intact.  Psychiatric:        Mood and Affect: Mood and affect normal.      Results for orders placed or performed in visit on 10/12/23  Microalbumin / creatinine urine ratio  Result Value Ref Range   Microalb, Ur 0.7 0.0 - 1.9 mg/dL   Creatinine,U 807.0 mg/dL   Microalb Creat Ratio 3.7 0.0 - 30.0 mg/g  POC HgB A1c  Result Value Ref Range   Hemoglobin A1C 6.2 (A) 4.0 - 5.6 %   HbA1c POC (<> result, manual entry)     HbA1c, POC (prediabetic range)     HbA1c, POC (controlled diabetic range)        The 10-year ASCVD risk score (Arnett DK, et al., 2019) is: 21.7%    Assessment & Plan:  Type 2 diabetes mellitus without complication, without long-term current use of insulin  (HCC) Assessment & Plan: A1C is controlled however he is having significant side effects to the Ozempic , I advised that we switch him to Mounjaro  since there are less side effects associated with this medication. Will also order urine testing for annual surveillance.   Orders: -     POCT glycosylated hemoglobin (Hb A1C) -     Collection capillary blood specimen -     Mounjaro ; Inject 15 mg into the skin once a week.  Dispense: 6 mL; Refill: 3 -     Microalbumin / creatinine urine ratio; Future  Essential hypertension Assessment & Plan: Current hypertension medications:       Sig   lisinopril  (ZESTRIL ) 2.5 MG tablet Take 1 tablet (2.5 mg total) by mouth daily.      BP is chronic, stable, well controlled, continue above medications  Orders: -     Lisinopril ; Take 1 tablet (2.5 mg total) by mouth daily.  Dispense: 90 tablet; Refill: 1  History of tobacco use -     CT CHEST LUNG CANCER SCREENING LOW DOSE WO CONTRAST; Future  Acute  bronchitis, unspecified organism -     Doxycycline  Hyclate; Take 1 tablet (100 mg total) by mouth 2 (two) times daily for 7 days.  Dispense: 14 tablet; Refill: 0 -     Albuterol  Sulfate HFA; Inhale 2 puffs into the lungs every 6 (six) hours as needed for wheezing or shortness of breath.  Dispense: 8 g; Refill: 2 -     methylPREDNISolone ; Take package as directed  Dispense: 21 each; Refill: 0   Acute flare up of COPD/emphysema, will  treat with doxycycline , albuterol  and steroids he is due for his annual lung cancer screenign as well, orders placed.  Return in about 6 months (around 04/13/2024).    Heron CHRISTELLA Sharper, MD

## 2023-10-19 ENCOUNTER — Ambulatory Visit: Payer: Self-pay | Admitting: Family Medicine

## 2023-10-19 DIAGNOSIS — E119 Type 2 diabetes mellitus without complications: Secondary | ICD-10-CM

## 2023-10-19 NOTE — Assessment & Plan Note (Signed)
 Current hypertension medications:       Sig   lisinopril  (ZESTRIL ) 2.5 MG tablet Take 1 tablet (2.5 mg total) by mouth daily.      BP is chronic, stable, well controlled, continue above medications

## 2023-10-19 NOTE — Assessment & Plan Note (Signed)
 A1C is controlled however he is having significant side effects to the Ozempic , I advised that we switch him to Mounjaro  since there are less side effects associated with this medication. Will also order urine testing for annual surveillance.

## 2023-10-24 ENCOUNTER — Ambulatory Visit
Admission: RE | Admit: 2023-10-24 | Discharge: 2023-10-24 | Disposition: A | Discharge status: Home / Self Care | Source: Ambulatory Visit | Attending: Family Medicine | Admitting: Family Medicine

## 2023-10-24 DIAGNOSIS — Z87891 Personal history of nicotine dependence: Secondary | ICD-10-CM | POA: Diagnosis not present

## 2023-10-24 DIAGNOSIS — Z122 Encounter for screening for malignant neoplasm of respiratory organs: Secondary | ICD-10-CM | POA: Diagnosis not present

## 2023-11-08 MED ORDER — ROSUVASTATIN CALCIUM 10 MG PO TABS: 10.0000 mg | ORAL_TABLET | Freq: Every day | ORAL | 5 refills | Status: AC

## 2023-12-13 ENCOUNTER — Ambulatory Visit: Admitting: Family Medicine

## 2023-12-13 ENCOUNTER — Encounter: Payer: Self-pay | Admitting: Family Medicine

## 2023-12-13 ENCOUNTER — Ambulatory Visit (INDEPENDENT_AMBULATORY_CARE_PROVIDER_SITE_OTHER): Admitting: Family Medicine

## 2023-12-13 DIAGNOSIS — Z0000 Encounter for general adult medical examination without abnormal findings: Secondary | ICD-10-CM | POA: Diagnosis not present

## 2023-12-13 NOTE — Progress Notes (Signed)
 Subjective:   Jerome Sims is a 68 y.o. male who presents for Medicare Annual/Subsequent preventive examination.  Visit Complete: Virtual I connected with  Marinda JULIANNA Gibson on 12/13/23 by a video and audio enabled telemedicine application and verified that I am speaking with the correct person using two identifiers.  Patient Location: Home  Provider Location: Office/Clinic  I discussed the limitations of evaluation and management by telemedicine. The patient expressed understanding and agreed to proceed.  Vital Signs: Because this visit was a virtual/telehealth visit, some criteria may be missing or patient reported. Any vitals not documented were not able to be obtained and vitals that have been documented are patient reported.  Patient Medicare AWV questionnaire was completed by the patient on 12/10/2023; I have confirmed that all information answered by patient is correct and no changes since this date.        Objective:    There were no vitals filed for this visit. There is no height or weight on file to calculate BMI.     12/13/2023    4:20 PM 04/20/2022    3:36 PM 07/04/2017    6:15 PM 07/04/2017   10:57 AM 06/28/2017    9:34 AM 01/04/2017    8:18 AM 12/30/2016    4:29 PM  Advanced Directives  Does Patient Have a Medical Advance Directive? No No No  No  No  No  No   Would patient like information on creating a medical advance directive? Yes (MAU/Ambulatory/Procedural Areas - Information given) No - Patient declined No - Patient declined  No - Patient declined   No - Patient declined  No - Patient declined      Data saved with a previous flowsheet row definition    Current Medications (verified) Outpatient Encounter Medications as of 12/13/2023  Medication Sig   albuterol  (VENTOLIN  HFA) 108 (90 Base) MCG/ACT inhaler Inhale 2 puffs into the lungs every 6 (six) hours as needed for wheezing or shortness of breath.   clotrimazole -betamethasone  (LOTRISONE ) cream Apply 1  Application topically 2 (two) times daily.   glucose blood (ONETOUCH VERIO) test strip USE TO TEST BLOOD GLUCOSE ONCE DAILY   Lancets (ONETOUCH ULTRASOFT) lancets USE TO TEST BLOOD GLUCOSE ONCE DAILY   lisinopril  (ZESTRIL ) 2.5 MG tablet Take 1 tablet (2.5 mg total) by mouth daily.   meloxicam  (MOBIC ) 15 MG tablet Take 1 tablet by mouth once daily   methylPREDNISolone  (MEDROL  DOSEPAK) 4 MG TBPK tablet Take package as directed   rosuvastatin  (CRESTOR ) 10 MG tablet Take 1 tablet (10 mg total) by mouth daily.   tirzepatide  (MOUNJARO ) 15 MG/0.5ML Pen Inject 15 mg into the skin once a week.   tiZANidine  (ZANAFLEX ) 4 MG tablet Take 1 tablet (4 mg total) by mouth every 6 (six) hours as needed for muscle spasms.   No facility-administered encounter medications on file as of 12/13/2023.    Allergies (verified) Patient has no known allergies.   History: Past Medical History:  Diagnosis Date   Arthritis    Complication of anesthesia    Woke up swinging   COPD (chronic obstructive pulmonary disease) (HCC)    Diabetes mellitus without complication (HCC)    History of kidney stones    Hypertension    HX OF 1 YEAR AGO NEVER TOOK MEDS FOR   Obesity    Sleep apnea    Tobacco use    Past Surgical History:  Procedure Laterality Date   ANTERIOR CERVICAL DECOMP/DISCECTOMY FUSION N/A 07/04/2017   Procedure:  C5-6, C6-7, ANTERIOR CERVICAL DECOMPRESSION/DISCECTOMY FUSION,  ALLOGRAFT PLATE;  Surgeon: Barbarann Oneil BROCKS, MD;  Location: [facility] OR;  Service: Orthopedics;  Laterality: N/A;   APPENDECTOMY     back ablation  2023   Done at [facility]   CARDIAC CATHETERIZATION     years ago    CARPAL TUNNEL RELEASE Right    CARPAL TUNNEL RELEASE Left 07/04/2017   Procedure: LEFT CARPAL TUNNEL RELEASE;  Surgeon: Barbarann Oneil BROCKS, MD;  Location: [facility] OR;  Service: Orthopedics;  Laterality: Left;   CHOLECYSTECTOMY     COLONOSCOPY N/A 01/04/2017   Procedure: COLONOSCOPY;  Surgeon: Legrand Victory LITTIE DOUGLAS, MD;  Location: [facility] ENDOSCOPY;   Service: Gastroenterology;  Laterality: N/A;   KNEE SURGERY Right    REPLACEMENT TOTAL KNEE Right 04/26/2022   Dr Barbarann   TOTAL KNEE ARTHROPLASTY Right 04/26/2022   Procedure: RIGHT TOTAL KNEE ARTHROPLASTY;  Surgeon: Barbarann Oneil BROCKS, MD;  Location: [facility] OR;  Service: Orthopedics;  Laterality: Right;  RNFA REQUESTED   Family History  Problem Relation Age of Onset   Diabetes Mother    Arthritis Mother    AAA (abdominal aortic aneurysm) Mother    Intellectual disability Father    Arthritis Father    AAA (abdominal aortic aneurysm) Father    Coronary artery disease Maternal Grandmother        Died during CABG   Colon cancer Neg Hx    Esophageal cancer Neg Hx    Pancreatic cancer Neg Hx    Rectal cancer Neg Hx    Stomach cancer Neg Hx    Social History   Socioeconomic History   Marital status: Single    Spouse name: Not on file   Number of children: Not on file   Years of education: Not on file   Highest education level: GED or equivalent  Occupational History   Not on file  Tobacco Use   Smoking status: Former    Current packs/day: 0.00    Average packs/day: 1 pack/day for 30.0 years (30.0 ttl pk-yrs)    Types: Cigarettes    Start date: 81    Quit date: 2018    Years since quitting: 7.6   Smokeless tobacco: Never   Tobacco comments:    QUIT October 18 2016  Vaping Use   Vaping status: Never Used  Substance and Sexual Activity   Alcohol use: No    Alcohol/week: 0.0 standard drinks of alcohol   Drug use: No   Sexual activity: Not Currently  Other Topics Concern   Not on file  Social History Narrative   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    In his early 13s he was a Candidate but never Patched for [employer] --> that is when he was in involved in PSA   Social Drivers of Health   Financial Resource Strain: Low Risk  (12/13/2023)   Overall Financial Resource Strain (CARDIA)     Difficulty of Paying Living Expenses: Not very hard  Food Insecurity: No Food Insecurity (12/13/2023)   Hunger Vital Sign    Worried About Running Out of Food in the Last Year: Never true    Ran Out of Food in the Last Year: Never true  Transportation Needs: No Transportation Needs (12/13/2023)   PRAPARE - Transportation    Lack of Transportation (Medical): No    Lack  of Transportation (Non-Medical): No  Physical Activity: Insufficiently Active (12/13/2023)   Exercise Vital Sign    Days of Exercise per Week: 1 day    Minutes of Exercise per Session: 10 min  Stress: Stress Concern Present (12/13/2023)   [employer] of Occupational Health - Occupational Stress Questionnaire    Feeling of Stress: To some extent  Social Connections: Moderately Integrated (12/13/2023)   Social Connection and Isolation Panel    Frequency of Communication with Friends and Family: More than three times a week    Frequency of Social Gatherings with Friends and Family: More than three times a week    Attends Religious Services: More than 4 times per year    Active Member of [employer] or Organizations: Yes    Attends Engineer, structural: More than 4 times per year    Marital Status: Never married    Tobacco Counseling Counseling given: Not Answered Tobacco comments: QUIT October 18 2016   Clinical Intake:  Pre-visit preparation completed: Yes        Nutritional Status: BMI > 30  Obese Nutritional Risks: None Diabetes: Yes CBG done?: No Did pt. bring in CBG monitor from home?: No  How often do you need to have someone help you when you read instructions, pamphlets, or other written materials from your doctor or pharmacy?: 1 - Never  Interpreter Needed?: No      Activities of Daily Living    12/10/2023    8:24 PM  In your present state of health, do you have any difficulty performing the following activities:  Hearing? 0  Vision? 0  Difficulty concentrating or making decisions? 0   Walking or climbing stairs? 0  Dressing or bathing? 0  Doing errands, shopping? 0  Preparing Food and eating ? N  Using the Toilet? N  In the past six months, have you accidently leaked urine? N  Do you have problems with loss of bowel control? N  Managing your Medications? N  Managing your Finances? N  Housekeeping or managing your Housekeeping? N    Patient Care Team: Ozell Heron HERO, MD as PCP - General (Family Medicine) Anner Alm ORN, MD as PCP - Cardiology (Cardiology) Legrand Victory LITTIE MOULD, MD as Consulting Physician (Gastroenterology) Barbarann Oneil BROCKS, MD (Inactive) as Consulting Physician (Orthopedic Surgery)  Indicate any recent Medical Services you may have received from other than [facility] providers in the past year (date may be approximate).     Assessment:   This is a routine wellness examination for [place].  Hearing/Vision screen No results found.   Goals Addressed             This Visit's Progress    Activity and Exercise Increased       Evidence-based guidance:  Review current exercise levels.  Assess patient perspective on exercise or activity level, barriers to increasing activity, motivation and readiness for change.  Recommend or set healthy exercise goal based on individual tolerance.  Encourage small steps toward making change in amount of exercise or activity.  Urge reduction of sedentary activities or screen time.  Promote group activities within the community or with family or support person.  Consider referral to rehabiliation therapist for assessment and exercise/activity plan.   Notes:        Depression Screen    12/13/2023    3:23 PM 01/28/2023    8:52 AM 10/28/2022    1:04 PM 06/22/2022    4:35 PM 11/20/2014    2:51 PM  11/13/2014    6:09 PM  PHQ 2/9 Scores  PHQ - 2 Score 0 0 0 0 0 0  PHQ- 9 Score  1        Fall Risk    12/10/2023    8:24 PM 10/28/2022    1:04 PM  Fall Risk   Falls in the past year? 1 0  Number falls in past yr: 0  0  Injury with Fall? 0 0    MEDICARE RISK AT HOME: Medicare Risk at Home Any stairs in or around the home?: (Patient-Rptd) Yes If so, are there any without handrails?: (Patient-Rptd) No Home free of loose throw rugs in walkways, pet beds, electrical cords, etc?: (Patient-Rptd) No Adequate lighting in your home to reduce risk of falls?: (Patient-Rptd) Yes Life alert?: (Patient-Rptd) No Use of a cane, walker or w/c?: (Patient-Rptd) No Grab bars in the bathroom?: (Patient-Rptd) No Shower chair or bench in shower?: (Patient-Rptd) No Elevated toilet seat or a handicapped toilet?: (Patient-Rptd) No  TIMED UP AND GO:  Was the test performed?  No    Cognitive Function:        12/13/2023    4:22 PM  6CIT Screen  What Year? 0 points  What month? 0 points  What time? 0 points  Count back from 20 0 points  Months in reverse 0 points  Repeat phrase 0 points  Total Score 0 points    Immunizations Immunization History  Administered Date(s) Administered   Fluad Trivalent(High Dose 65+) 01/28/2023   Influenza Inj Mdck Quad Pf 01/22/2020   Influenza,inj,Quad PF,6+ Mos 01/03/2017, 01/12/2019, 01/07/2021   Influenza-Unspecified 12/15/2021, 11/29/2023   PNEUMOCOCCAL CONJUGATE-20 01/21/2021   Pneumococcal Polysaccharide-23 01/22/2020   Respiratory Syncytial Virus Vaccine,Recomb Aduvanted(Arexvy) 12/15/2021   Tdap 11/13/2014, 03/06/2019, 12/15/2021   Zoster Recombinant(Shingrix) 01/22/2020, 06/23/2020    TDAP status: Up to date  Flu Vaccine status: Up to date  Pneumococcal vaccine status: Up to date  Covid-19 vaccine status: Completed vaccines  Qualifies for Shingles Vaccine? Yes   Zostavax completed Yes   Shingrix Completed?: Yes  Screening Tests Health Maintenance  Topic Date Due   COVID-19 Vaccine (1 - 2024-25 season) Never done   Diabetic kidney evaluation - eGFR measurement  01/28/2024   FOOT EXAM  01/28/2024   HEMOGLOBIN A1C  04/13/2024   OPHTHALMOLOGY EXAM   06/13/2024   Diabetic kidney evaluation - Urine ACR  10/11/2024   Lung Cancer Screening  10/23/2024   Medicare Annual Wellness (AWV)  12/12/2024   Colonoscopy  01/05/2027   DTaP/Tdap/Td (4 - Td or Tdap) 12/16/2031   Pneumococcal Vaccine: 50+ Years  Completed   INFLUENZA VACCINE  Completed   Hepatitis C Screening  Completed   Zoster Vaccines- Shingrix  Completed   HPV VACCINES  Aged Out   Meningococcal B Vaccine  Aged Out    Health Maintenance  Health Maintenance Due  Topic Date Due   COVID-19 Vaccine (1 - 2024-25 season) Never done    Colorectal cancer screening: Type of screening: Colonoscopy. Completed 2018. Repeat every 10 years  Lung Cancer Screening: (Low Dose CT Chest recommended if Age 45-80 years, 20 pack-year currently smoking OR have quit w/in 15years.) does qualify.   Lung Cancer Screening Referral: already done on 10/24/2023  Additional Screening:  Hepatitis C Screening: does qualify; Completed 01/2023  Vision Screening: Recommended annual ophthalmology exams for early detection of glaucoma and other disorders of the eye. Is the patient up to date with their annual eye exam?  Yes  Who is the provider or what is the name of the office in which the patient attends annual eye exams? Dr. Laymon Monte If pt is not established with a provider, would they like to be referred to a provider to establish care? No .   Dental Screening: Recommended annual dental exams for proper oral hygiene  Diabetic Foot Exam: Diabetic Foot Exam: Completed 01/28/2023  Community Resource Referral / Chronic Care Management: CRR required this visit?  No   CCM required this visit?  No     Plan:     I have personally reviewed and noted the following in the patient's chart:   Medical and social history Use of alcohol, tobacco or illicit drugs  Current medications and supplements including opioid prescriptions. Patient is not currently taking opioid prescriptions. Functional  ability and status Nutritional status Physical activity Advanced directives List of other physicians Hospitalizations, surgeries, and ER visits in previous 12 months Vitals Screenings to include cognitive, depression, and falls Referrals and appointments  In addition, I have reviewed and discussed with patient certain preventive protocols, quality metrics, and best practice recommendations. A written personalized care plan for preventive services as well as general preventive health recommendations were provided to patient.     Heron CHRISTELLA Sharper, MD   12/13/2023   After Visit Summary: (MyChart) Due to this being a telephonic visit, the after visit summary with patients personalized plan was offered to patient via MyChart   Nurse Notes: N/A

## 2023-12-13 NOTE — Progress Notes (Signed)
 Patient unable to check vital signs as he was driving at the time of check in.

## 2024-02-09 ENCOUNTER — Other Ambulatory Visit: Payer: Self-pay | Admitting: Medical Genetics

## 2024-02-09 DIAGNOSIS — Z006 Encounter for examination for normal comparison and control in clinical research program: Secondary | ICD-10-CM

## 2024-03-07 ENCOUNTER — Ambulatory Visit (INDEPENDENT_AMBULATORY_CARE_PROVIDER_SITE_OTHER): Payer: Self-pay | Admitting: Family Medicine

## 2024-03-07 ENCOUNTER — Encounter: Payer: Self-pay | Admitting: Family Medicine

## 2024-03-07 VITALS — BP 128/62 | HR 55 | Temp 97.6°F | Ht 67.75 in | Wt 277.1 lb

## 2024-03-07 DIAGNOSIS — M19041 Primary osteoarthritis, right hand: Secondary | ICD-10-CM

## 2024-03-07 DIAGNOSIS — L309 Dermatitis, unspecified: Secondary | ICD-10-CM

## 2024-03-07 DIAGNOSIS — F419 Anxiety disorder, unspecified: Secondary | ICD-10-CM

## 2024-03-07 MED ORDER — VENLAFAXINE HCL ER 37.5 MG PO CP24: 37.5000 mg | ORAL_CAPSULE | Freq: Every day | ORAL | 3 refills | Status: AC

## 2024-03-07 MED ORDER — CLOTRIMAZOLE-BETAMETHASONE 1-0.05 % EX CREA: 1.0000 | TOPICAL_CREAM | Freq: Two times a day (BID) | CUTANEOUS | 5 refills | Status: AC

## 2024-03-07 MED ORDER — METHYLPREDNISOLONE 4 MG PO TBPK: ORAL_TABLET | ORAL | 0 refills | Status: AC

## 2024-03-07 NOTE — Progress Notes (Signed)
 Established Patient Office Visit  Subjective   Patient ID: Jerome Sims, male    DOB: 1956-02-27  Age: 68 y.o. MRN: [id number]  Chief Complaint  Patient presents with   Rash    Patient complains of itchy, red, swollen rash noted along the eyebrows x3 weeks   Hand Pain    Right hand pain and swelling x3 months, no known injury    Rash  Hand Pain   Discussed the use of AI scribe software for clinical note transcription with the patient, who gave verbal consent to proceed.  History of Present Illness   Jerome Sims is a 68 year old male who presents with a rash on his forehead.  He has had a red, flaky rash on the upper part of his forehead for about three weeks. It burns rather than itches. It started as a pimple he expressed with pus discharge. A cream he believes is a steroid reduces redness, but the rash recurs when he stops it. 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  He has painful, swollen arthritis in his hands. He takes meloxicam  but avoids daily use because he is worried about kidney effects. Previously the swelling would go down, but now it persists.  He has significant anxiety and depression worsened by recent financial conflict with his sister. He feels "like a ball of nerves" and has thoughts he "shouldn't be having." He has had adverse reactions to Paxil and Zoloft and is worried about side effects from antidepressants.  He had neck surgery in the past and had disability benefits that stopped when he returned to work. He currently has only hospital coverage through Medicare Parts A and B and cannot afford more comprehensive insurance until February, which limits his ability to seek care and medications.       Current Outpatient Medications  Medication Instructions   albuterol  (VENTOLIN  HFA) 108 (90 Base) MCG/ACT inhaler 2 puffs, Inhalation, Every 6 hours PRN   clotrimazole -betamethasone  (LOTRISONE )  cream 1 Application, Topical, 2 times daily   glucose blood (ONETOUCH VERIO) test strip USE TO TEST BLOOD GLUCOSE ONCE DAILY   Lancets (ONETOUCH ULTRASOFT) lancets USE TO TEST BLOOD GLUCOSE ONCE DAILY   lisinopril  (ZESTRIL ) 2.5 mg, Oral, Daily   meloxicam  (MOBIC ) 15 mg, Oral, Daily   methylPREDNISolone  (MEDROL  DOSEPAK) 4 MG TBPK tablet Take Package as directed.   Mounjaro  15 mg, Subcutaneous, Weekly   rosuvastatin  (CRESTOR ) 10 mg, Oral, Daily   tiZANidine  (ZANAFLEX ) 4 mg, Oral, Every 6 hours PRN   venlafaxine  XR (EFFEXOR  XR) 37.5 mg, Oral, Daily with breakfast    Patient Active Problem List   Diagnosis Date Noted   Dyspnea on exertion 08/01/2022   Metabolic syndrome 08/01/2022   Family history of abdominal aortic aneurysm (AAA) 07/26/2022   Family history of coronary artery disease in maternal grandmother 07/26/2022   Chronic bilateral low back pain without sciatica 06/23/2022   S/P total knee arthroplasty 04/26/2022   Essential hypertension 02/21/2022   Hyperlipidemia associated with type 2 diabetes mellitus (HCC) 02/21/2022   Preoperative cardiovascular examination 02/21/2022   S/P cervical spinal fusion 07/15/2017   Type 2 diabetes mellitus without complication, without long-term current use of insulin  (HCC) 05/31/2017   Left hip pain 03/24/2017   Carpal tunnel syndrome of left wrist 12/15/2016   Kidney stones 09/23/2016   Heavy smoker (more than 20 cigarettes per day) 09/21/2016   Sun-damaged skin 09/21/2016   Morbid  obesity with BMI of 45.0-49.9, adult (HCC) 09/20/2016   OSA on CPAP 09/20/2016   Other emphysema (HCC) 09/20/2016     Review of Systems  Skin:  Positive for rash.  All other systems reviewed and are negative.     Objective:     BP 128/62   Pulse (!) 55   Temp 97.6 F (36.4 C) (Oral)   Ht 5' 7.75 (1.721 m)   Wt 277 lb 1.6 oz (125.7 kg)   SpO2 98%   BMI 42.44 kg/m    Physical Exam Vitals reviewed.  Constitutional:      Appearance: Normal  appearance. He is obese.  Cardiovascular:     Rate and Rhythm: Normal rate and regular rhythm.     Heart sounds: Normal heart sounds. No murmur heard. Pulmonary:     Effort: Pulmonary effort is normal.     Breath sounds: Normal breath sounds. No wheezing.  Abdominal:     General: Bowel sounds are normal.  Neurological:     Mental Status: He is alert and oriented to person, place, and time. Mental status is at baseline.  Psychiatric:        Mood and Affect: Mood and affect normal.        Speech: Speech normal.        Behavior: Behavior normal.        Cognition and Memory: Cognition and memory normal.        Judgment: Judgment normal.      No results found for any visits on 03/07/24.    The 10-year ASCVD risk score (Arnett DK, et al., 2019) is: 29.3%    Assessment & Plan:  Osteoarthritis of hand, primary localized, right -     methylPREDNISolone ; Take Package as directed.  Dispense: 21 each; Refill: 0  Dermatitis of face -     Clotrimazole -Betamethasone ; Apply 1 Application topically 2 (two) times daily.  Dispense: 45 g; Refill: 5  Anxiety -     Venlafaxine  HCl ER; Take 1 capsule (37.5 mg total) by mouth daily with breakfast.  Dispense: 30 capsule; Refill: 3   Assessment and Plan    Facial dermatitis Rash on the upper forehead for three weeks, initially presented as a pimple with pus. Currently red, flaky, and burning, with some itching. Differential includes dermatitis, seborrheic dermatitis, contact dermatitis, or bacterial infection. Previous treatment with clotrimazole  betamethasone  cream provided some relief. Insurance issues may affect treatment continuity. - Refilled clotrimazole  betamethasone  cream, apply twice daily for 7-10 days. - If rash persists or recurs after stopping cream, consider using over-the-counter antibacterial cream like Neosporin.  Primary osteoarthritis of right hand Swelling and pain in the right hand, consistent with osteoarthritis. Currently  managed with meloxicam , but he is concerned about kidney effects. No history of gout. Discussed potential use of Medrol  Dosepak to reduce inflammation, which may also help with facial dermatitis if allergic reaction is involved. - Continue meloxicam  as needed, with food to prevent stomach upset. - Will consider Medrol  Dosepak to reduce inflammation.  Depression and anxiety Experiencing increased anxiety and depressive symptoms, exacerbated by recent family issues. Previous adverse reactions to multiple antidepressants. Discussed potential use of venlafaxine  (Effexor ) as a non-sedating option. He expressed interest in trying a new medication despite past side effects. - Prescribed venlafaxine  (Effexor ) at the lowest dose of 37.5 mg. - Monitor for side effects and efficacy over the next few weeks.     Return in about 3 months (around 06/07/2024) for follow up on depression/anxiety.  Heron CHRISTELLA Sharper, MD

## 2024-04-13 ENCOUNTER — Ambulatory Visit: Admitting: Family Medicine

## 2024-04-16 ENCOUNTER — Ambulatory Visit: Admitting: Family Medicine

## 2024-05-16 ENCOUNTER — Ambulatory Visit: Admitting: Family Medicine

## 2024-06-13 ENCOUNTER — Ambulatory Visit: Admitting: Family Medicine
